# Patient Record
Sex: Male | Born: 1950
Health system: Southern US, Community
[De-identification: ages and names within clinical notes are randomized; demographics above are authoritative.]

## PROBLEM LIST (undated history)

## (undated) DIAGNOSIS — K219 Gastro-esophageal reflux disease without esophagitis: Secondary | ICD-10-CM

## (undated) DIAGNOSIS — R7301 Impaired fasting glucose: Secondary | ICD-10-CM

## (undated) DIAGNOSIS — C44300 Unspecified malignant neoplasm of skin of unspecified part of face: Secondary | ICD-10-CM

## (undated) DIAGNOSIS — G459 Transient cerebral ischemic attack, unspecified: Secondary | ICD-10-CM

## (undated) DIAGNOSIS — G4733 Obstructive sleep apnea (adult) (pediatric): Secondary | ICD-10-CM

## (undated) DIAGNOSIS — G4730 Sleep apnea, unspecified: Secondary | ICD-10-CM

## (undated) DIAGNOSIS — R2681 Unsteadiness on feet: Secondary | ICD-10-CM

## (undated) DIAGNOSIS — E7800 Pure hypercholesterolemia, unspecified: Secondary | ICD-10-CM

## (undated) DIAGNOSIS — I10 Essential (primary) hypertension: Secondary | ICD-10-CM

## (undated) HISTORY — PX: OTHER SURGICAL HISTORY: SHX169

## (undated) HISTORY — DX: Obstructive sleep apnea (adult) (pediatric): G47.33

## (undated) HISTORY — DX: Gastro-esophageal reflux disease without esophagitis: K21.9

## (undated) HISTORY — PX: COLONOSCOPY: SHX174

## (undated) HISTORY — DX: Unspecified malignant neoplasm of skin of unspecified part of face: C44.300

## (undated) HISTORY — DX: Pure hypercholesterolemia, unspecified: E78.00

## (undated) HISTORY — DX: Essential (primary) hypertension: I10

## (undated) HISTORY — DX: Impaired fasting glucose: R73.01

---

## 2000-12-03 ENCOUNTER — Encounter: Payer: Self-pay | Admitting: Family Medicine

## 2000-12-03 ENCOUNTER — Encounter: Admission: RE | Admit: 2000-12-03 | Discharge: 2000-12-03 | Payer: Self-pay | Admitting: Family Medicine

## 2013-06-22 ENCOUNTER — Institutional Professional Consult (permissible substitution): Payer: BC Managed Care – PPO | Admitting: Neurology

## 2013-06-29 ENCOUNTER — Encounter: Payer: Self-pay | Admitting: Neurology

## 2013-06-29 ENCOUNTER — Encounter (INDEPENDENT_AMBULATORY_CARE_PROVIDER_SITE_OTHER): Payer: Self-pay

## 2013-06-29 ENCOUNTER — Ambulatory Visit (INDEPENDENT_AMBULATORY_CARE_PROVIDER_SITE_OTHER): Payer: BC Managed Care – PPO | Admitting: Neurology

## 2013-06-29 VITALS — BP 156/82 | HR 49 | Resp 16 | Ht 70.0 in | Wt 192.0 lb

## 2013-06-29 DIAGNOSIS — R0683 Snoring: Secondary | ICD-10-CM

## 2013-06-29 DIAGNOSIS — R0609 Other forms of dyspnea: Secondary | ICD-10-CM

## 2013-06-29 NOTE — Progress Notes (Signed)
[facility]  Provider:  Melvyn Novas, M D  Referring Provider: Delorse Lek, MD Primary Care Physician:  Delorse Lek, MD  Chief Complaint  Patient presents with  . eval of OSA    HPI:  Caleb Rogers is a 62 y.o. male  Is seen here as a referral   from Dr. Doristine Counter for  Sleep evaluation.   Is, in married, Caucasian, right-handed gentleman presents today upon request of his spouse. She has noted that he is snoring [name] and all lower and has actually 8 or recorded as snoring sounds. From the variation in amplitude and sleep rhythm she concluded that the patient likely has apnea. The patient himself feels not impaired in any way, he is overall a very healthy individual whose past medical history is only positive for high cholesterol he has no surgical history. He has a family history on the paternal side and in his brother for coronary artery disease so.  The patient states that he falls asleep very quickly and promptly when going to bed, his regular bedtime as Pan, taken only a couple of minutes to fall asleep. Sleeps through the  night except for one bathroom break at about 4 AM. He noted nor difficulties to reinitiate sleep after this nocturia. He rises at 6.45 , spontaneously waking, no alarm needed.  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 He is exposed to daylight.  No further use of caffeine, takes power naps about 2-3 days a week. No shift work history. He shares his bedroom with his wife who is bothered by his snoring. He doesn't watch TV or read in bed.  No morning headaches, but  dry mouth , no rhinitis.   No family history of sleep disorders, father took naps ( he was a country GP ) died in his 79's.    He will occasionally take a nap in the afternoon, the patient is a nap will be 5 -20 minutes. Power naps - feels refreshed and recharged. He exercises daily , feels that helps him with  stress.   Review of Systems: Out of a complete 14 system review, the patient complains of only the following symptoms, and all other reviewed systems are negative. Snoring, daytime naps. EDS 1 , FSS zero.   History   Social History  . Marital Status: Single    Spouse Name: N/A    Number of Children: N/A  . Years of Education: N/A   Occupational History  . Not on file.   Social History Main Topics  . Smoking status: Former Games developer  . Smokeless tobacco: Never Used     Comment: pt quit July 1997  . Alcohol Use: 1.8 oz/week    3 Glasses of wine per week     Comment: SOCIAL  . Drug Use: No  . Sexual Activity: Yes    Partners: Female   Other Topics Concern  . Not on file   Social History Narrative  . No narrative on file    Family History  Problem Relation Age of Onset  . Cancer Mother   . Congestive Heart Failure Mother   . Coronary artery disease Father   . Coronary artery disease Brother 82    Past Medical History  Diagnosis Date  . GERD (gastroesophageal reflux disease)   . Impaired fasting glucose   . Pure hypercholesterolemia     Past Surgical History  Procedure Laterality Date  . None      Current Outpatient Prescriptions  Medication Sig Dispense Refill  . aspirin 81 MG tablet Take 81 mg by mouth daily.      Marland Kitchen atorvastatin (LIPITOR) 40 MG tablet Take 40 mg by mouth daily. 1/2 TAB QD      . omeprazole (PRILOSEC) 20 MG capsule Take 20 mg by mouth daily.       No current facility-administered medications for this visit.    Allergies as of 06/29/2013  . (No Known Allergies)    Vitals: BP 156/82  Pulse 49  Resp 16  Ht 5\' 10"  (1.778 m)  Wt 192 lb (87.091 kg)  BMI 27.55 kg/m2 Last Weight:  Wt Readings from Last 1 Encounters:  06/29/13 192 lb (87.091 kg)   Last Height:   Ht Readings from Last 1 Encounters:  06/29/13 5\' 10"  (1.778 m)    Physical exam:  General: The patient is awake, alert and appears not in acute distress. The patient is  well groomed. Head: Normocephalic, atraumatic. Neck is supple. Mallampati 2-3 elongated uvula, no TMJ and no lateral restriction. , neck circumference: 14.75   Cardiovascular:  Regular rate and rhythm, without  murmurs or carotid bruit, and without distended neck veins. Respiratory: Lungs are clear to auscultation. Skin:  Without evidence of edema, or rash Trunk: BMI is elevated , patient has normal posture.  Neurologic exam : The patient is awake and alert, oriented to place and time.  Memory subjective  described as intact. There is a normal attention span & concentration ability.  Speech is fluent without  dysarthria, dysphonia or aphasia. Mood and affect are appropriate.  Cranial nerves: Pupils are equal and briskly reactive to light. Funduscopic exam without  evidence of pallor or edema. Extraocular movements  in vertical and horizontal planes intact and without nystagmus. Visual fields by finger perimetry are intact. Hearing to finger rub intact.  Facial sensation intact to fine touch. Facial motor strength is symmetric and tongue and uvula move midline.  Motor exam:   Normal tone and normal muscle bulk and symmetric normal strength in all extremities.  Sensory:  Fine touch, pinprick and vibration were tested in all extremities.  Proprioception is tested in the upper extremities only. This was  normal.  Coordination: Rapid alternating movements in the fingers/hands is tested and normal.  Finger-to-nose maneuver tested and normal without evidence of ataxia, dysmetria or tremor.  Gait and station: Patient walks without assistive device . Strength within normal limits.  Stance is stable and normal. Tandem gait  unfragmented. Romberg testing is  normal.  Deep tendon reflexes: in the  upper and lower extremities are symmetric and intact.  Babinski maneuver response is downgoing.   Assessment:  After physical and neurologic examination, review of laboratory studies, imaging,  neurophysiology testing and pre-existing records, assessment is :  1) snoring , crescendo - with witnessed apnea. 2) no obvious physical risk factors.   Plan:  Treatment plan and additional workup :  sleep on your side,  Consider dental device for snoring.   1)  HST:  For this patient , unless not covered by insurance.

## 2013-06-29 NOTE — Patient Instructions (Signed)
Sleep Apnea  Sleep apnea is disorder that affects a person's sleep. A person with sleep apnea has abnormal pauses in their breathing when they sleep. It is hard for them to get a good sleep. This makes a person tired during the day. It also can lead to other physical problems. There are three types of sleep apnea. One type is when breathing stops for a short time because your airway is blocked (obstructive sleep apnea). Another type is when the brain sometimes fails to give the normal signal to breathe to the muscles that control your breathing (central sleep apnea). The third type is a combination of the other two types.  HOME CARE  · Do not sleep on your back. Try to sleep on your side.  · Take all medicine as told by your doctor.  · Avoid alcohol, calming medicines (sedatives), and depressant drugs.  · Try to lose weight if you are overweight. Talk to your doctor about a healthy weight goal.  Your doctor may have you use a device that helps to open your airway. It can help you get the air that you need. It is called a positive airway pressure (PAP) device. There are three types of PAP devices:  · Continuous positive airway pressure (CPAP) device.  · Nasal expiratory positive airway pressure (EPAP) device.  · Bilevel positive airway pressure (BPAP) device.  MAKE SURE YOU:  · Understand these instructions.  · Will watch your condition.  · Will get help right away if you are not doing well or get worse.  Document Released: 04/17/2008 Document Revised: 06/25/2012 Document Reviewed: 11/10/2011  Exit[employer]® Patient Information ©2014 [employer].

## 2013-07-02 ENCOUNTER — Other Ambulatory Visit: Payer: Self-pay | Admitting: *Deleted

## 2013-07-02 DIAGNOSIS — G4733 Obstructive sleep apnea (adult) (pediatric): Secondary | ICD-10-CM

## 2013-07-02 NOTE — Progress Notes (Signed)
[employer] will not approve home sleep test but patient does qualify for in lab study based upon his history of witnessed apneas.

## 2013-07-12 ENCOUNTER — Ambulatory Visit (INDEPENDENT_AMBULATORY_CARE_PROVIDER_SITE_OTHER): Payer: BC Managed Care – PPO

## 2013-07-12 VITALS — BP 124/69

## 2013-07-12 DIAGNOSIS — G4733 Obstructive sleep apnea (adult) (pediatric): Secondary | ICD-10-CM

## 2013-07-21 ENCOUNTER — Telehealth: Payer: Self-pay | Admitting: Neurology

## 2013-07-21 ENCOUNTER — Encounter: Payer: Self-pay | Admitting: *Deleted

## 2013-07-21 DIAGNOSIS — G4733 Obstructive sleep apnea (adult) (pediatric): Secondary | ICD-10-CM

## 2013-07-21 NOTE — Telephone Encounter (Signed)
I called and left a message for the patient about his recent sleep study results. I informed the patient that the study confirmed the diagnosis of severe obstructive sleep apnea and Dr. Vickey Huger recommends CPAP therapy at home. I will fax a copy of the report to Dr. Mellody Life office and mail a copy to his home along with a follow up letter.

## 2013-08-23 DIAGNOSIS — R2681 Unsteadiness on feet: Secondary | ICD-10-CM

## 2013-08-23 HISTORY — DX: Unsteadiness on feet: R26.81

## 2013-09-15 ENCOUNTER — Ambulatory Visit: Payer: Self-pay | Admitting: Family Medicine

## 2013-09-16 ENCOUNTER — Emergency Department (HOSPITAL_COMMUNITY): Payer: BC Managed Care – PPO

## 2013-09-16 ENCOUNTER — Ambulatory Visit: Payer: Self-pay | Admitting: Family Medicine

## 2013-09-16 ENCOUNTER — Observation Stay (HOSPITAL_COMMUNITY): Payer: BC Managed Care – PPO

## 2013-09-16 ENCOUNTER — Encounter (HOSPITAL_COMMUNITY): Payer: Self-pay | Admitting: Emergency Medicine

## 2013-09-16 ENCOUNTER — Observation Stay (HOSPITAL_COMMUNITY)
Admission: EM | Admit: 2013-09-16 | Discharge: 2013-09-17 | Disposition: A | Discharge status: Home / Self Care | Payer: BC Managed Care – PPO | Attending: Internal Medicine | Admitting: Internal Medicine

## 2013-09-16 DIAGNOSIS — R209 Unspecified disturbances of skin sensation: Secondary | ICD-10-CM | POA: Insufficient documentation

## 2013-09-16 DIAGNOSIS — R262 Difficulty in walking, not elsewhere classified: Secondary | ICD-10-CM | POA: Insufficient documentation

## 2013-09-16 DIAGNOSIS — I10 Essential (primary) hypertension: Secondary | ICD-10-CM | POA: Diagnosis present

## 2013-09-16 DIAGNOSIS — R5383 Other fatigue: Secondary | ICD-10-CM

## 2013-09-16 DIAGNOSIS — G459 Transient cerebral ischemic attack, unspecified: Secondary | ICD-10-CM | POA: Diagnosis present

## 2013-09-16 DIAGNOSIS — Z9181 History of falling: Secondary | ICD-10-CM | POA: Insufficient documentation

## 2013-09-16 DIAGNOSIS — K219 Gastro-esophageal reflux disease without esophagitis: Secondary | ICD-10-CM | POA: Insufficient documentation

## 2013-09-16 DIAGNOSIS — R5381 Other malaise: Secondary | ICD-10-CM | POA: Insufficient documentation

## 2013-09-16 DIAGNOSIS — E7800 Pure hypercholesterolemia, unspecified: Secondary | ICD-10-CM | POA: Insufficient documentation

## 2013-09-16 DIAGNOSIS — R51 Headache: Secondary | ICD-10-CM | POA: Insufficient documentation

## 2013-09-16 DIAGNOSIS — R7301 Impaired fasting glucose: Secondary | ICD-10-CM | POA: Insufficient documentation

## 2013-09-16 DIAGNOSIS — E785 Hyperlipidemia, unspecified: Secondary | ICD-10-CM | POA: Diagnosis present

## 2013-09-16 DIAGNOSIS — R2681 Unsteadiness on feet: Secondary | ICD-10-CM | POA: Diagnosis present

## 2013-09-16 DIAGNOSIS — Z7982 Long term (current) use of aspirin: Secondary | ICD-10-CM | POA: Insufficient documentation

## 2013-09-16 DIAGNOSIS — R269 Unspecified abnormalities of gait and mobility: Principal | ICD-10-CM | POA: Insufficient documentation

## 2013-09-16 DIAGNOSIS — F1010 Alcohol abuse, uncomplicated: Secondary | ICD-10-CM | POA: Diagnosis present

## 2013-09-16 DIAGNOSIS — Z87891 Personal history of nicotine dependence: Secondary | ICD-10-CM | POA: Insufficient documentation

## 2013-09-16 HISTORY — DX: Unsteadiness on feet: R26.81

## 2013-09-16 HISTORY — DX: Sleep apnea, unspecified: G47.30

## 2013-09-16 LAB — COMPREHENSIVE METABOLIC PANEL
ALBUMIN: 3.8 g/dL (ref 3.5–5.2)
ALT: 30 U/L (ref 0–53)
AST: 31 U/L (ref 0–37)
Alkaline Phosphatase: 84 U/L (ref 39–117)
BUN: 21 mg/dL (ref 6–23)
CO2: 23 mEq/L (ref 19–32)
Calcium: 9.4 mg/dL (ref 8.4–10.5)
Chloride: 104 mEq/L (ref 96–112)
Creatinine, Ser: 0.82 mg/dL (ref 0.50–1.35)
GFR calc Af Amer: >90 mL/min (ref >90)
GFR calc non Af Amer: >90 mL/min (ref >90)
Glucose, Bld: 111 mg/dL — ABNORMAL HIGH (ref 70–99)
Potassium: 4.1 mEq/L (ref 3.7–5.3)
SODIUM: 142 meq/L (ref 137–147)
TOTAL PROTEIN: 7.5 g/dL (ref 6.0–8.3)
Total Bilirubin: 0.3 mg/dL (ref 0.3–1.2)

## 2013-09-16 LAB — URINALYSIS, ROUTINE W REFLEX MICROSCOPIC
Bilirubin Urine: NEGATIVE
Glucose, UA: NEGATIVE mg/dL
HGB URINE DIPSTICK: NEGATIVE
Ketones, ur: NEGATIVE mg/dL
Leukocytes, UA: NEGATIVE
Nitrite: NEGATIVE
PROTEIN: NEGATIVE mg/dL
SPECIFIC GRAVITY, URINE: 1.023 (ref 1.005–1.030)
UROBILINOGEN UA: 0.2 mg/dL (ref 0.0–1.0)
pH: 7 (ref 5.0–8.0)

## 2013-09-16 LAB — CBC
HCT: 43.6 % (ref 39.0–52.0)
HEMOGLOBIN: 15.7 g/dL (ref 13.0–17.0)
MCH: 31.5 pg (ref 26.0–34.0)
MCHC: 36 g/dL (ref 30.0–36.0)
MCV: 87.6 fL (ref 78.0–100.0)
Platelets: 323 10*3/uL (ref 150–400)
RBC: 4.98 MIL/uL (ref 4.22–5.81)
RDW: 13.5 % (ref 11.5–15.5)
WBC: 7.3 10*3/uL (ref 4.0–10.5)

## 2013-09-16 LAB — CBC WITH DIFFERENTIAL/PLATELET
BASOS ABS: 0 10*3/uL (ref 0.0–0.1)
Basophils Relative: 1 % (ref 0–1)
Eosinophils Absolute: 0.3 10*3/uL (ref 0.0–0.7)
Eosinophils Relative: 4 % (ref 0–5)
HCT: 41.7 % (ref 39.0–52.0)
Hemoglobin: 14.9 g/dL (ref 13.0–17.0)
Lymphocytes Relative: 46 % (ref 12–46)
Lymphs Abs: 3.1 10*3/uL (ref 0.7–4.0)
MCH: 31.4 pg (ref 26.0–34.0)
MCHC: 35.7 g/dL (ref 30.0–36.0)
MCV: 87.8 fL (ref 78.0–100.0)
Monocytes Absolute: 0.7 10*3/uL (ref 0.1–1.0)
Monocytes Relative: 11 % (ref 3–12)
NEUTROS PCT: 39 % — AB (ref 43–77)
Neutro Abs: 2.7 10*3/uL (ref 1.7–7.7)
PLATELETS: 293 10*3/uL (ref 150–400)
RBC: 4.75 MIL/uL (ref 4.22–5.81)
RDW: 13.4 % (ref 11.5–15.5)
WBC: 6.8 10*3/uL (ref 4.0–10.5)

## 2013-09-16 LAB — RAPID URINE DRUG SCREEN, HOSP PERFORMED
AMPHETAMINES: NOT DETECTED
BARBITURATES: NOT DETECTED
Benzodiazepines: NOT DETECTED
Cocaine: NOT DETECTED
Opiates: NOT DETECTED
Tetrahydrocannabinol: NOT DETECTED

## 2013-09-16 LAB — GLUCOSE, CAPILLARY: Glucose-Capillary: 124 mg/dL — ABNORMAL HIGH (ref 70–99)

## 2013-09-16 LAB — CREATININE, SERUM
CREATININE: 0.78 mg/dL (ref 0.50–1.35)
GFR calc Af Amer: >90 mL/min (ref >90)

## 2013-09-16 LAB — VITAMIN B12: Vitamin B-12: 461 pg/mL (ref 211–911)

## 2013-09-16 LAB — HEMOGLOBIN A1C
HEMOGLOBIN A1C: 5.8 % — AB (ref <5.7)
Hgb A1c MFr Bld: 5.8 % — ABNORMAL HIGH (ref <5.7)
Mean Plasma Glucose: 120 mg/dL — ABNORMAL HIGH (ref <117)
Mean Plasma Glucose: 120 mg/dL — ABNORMAL HIGH (ref <117)

## 2013-09-16 LAB — FOLATE

## 2013-09-16 MED ORDER — SODIUM CHLORIDE 0.9 % IV SOLN
INTRAVENOUS | Status: DC
  Administered 2013-09-16: 08:00:00 via INTRAVENOUS

## 2013-09-16 MED ORDER — LORAZEPAM 2 MG/ML IJ SOLN: 1.0000 mg | Freq: Four times a day (QID) | INTRAMUSCULAR | Status: DC | PRN

## 2013-09-16 MED ORDER — ENOXAPARIN SODIUM 40 MG/0.4ML ~~LOC~~ SOLN
40.0000 mg | SUBCUTANEOUS | Status: DC
  Administered 2013-09-16: 40 mg via SUBCUTANEOUS
  Filled 2013-09-16 (×3): qty 0.4

## 2013-09-16 MED ORDER — MAGNESIUM 500 MG PO CAPS: 500.0000 mg | ORAL_CAPSULE | ORAL | Status: DC

## 2013-09-16 MED ORDER — LORAZEPAM 1 MG PO TABS: 1.0000 mg | ORAL_TABLET | Freq: Four times a day (QID) | ORAL | Status: DC | PRN

## 2013-09-16 MED ORDER — FOLIC ACID 1 MG PO TABS
1.0000 mg | ORAL_TABLET | Freq: Every day | ORAL | Status: DC
  Administered 2013-09-16 – 2013-09-17 (×2): 1 mg via ORAL
  Filled 2013-09-16 (×2): qty 1

## 2013-09-16 MED ORDER — ATORVASTATIN CALCIUM 20 MG PO TABS
20.0000 mg | ORAL_TABLET | Freq: Every day | ORAL | Status: DC
  Administered 2013-09-16: 20 mg via ORAL
  Filled 2013-09-16 (×3): qty 1

## 2013-09-16 MED ORDER — ASPIRIN 325 MG PO TABS
325.0000 mg | ORAL_TABLET | Freq: Every day | ORAL | Status: DC
  Administered 2013-09-16 – 2013-09-17 (×2): 325 mg via ORAL
  Filled 2013-09-16 (×3): qty 1

## 2013-09-16 MED ORDER — PANTOPRAZOLE SODIUM 40 MG PO TBEC
40.0000 mg | DELAYED_RELEASE_TABLET | Freq: Every day | ORAL | Status: DC
  Administered 2013-09-16 – 2013-09-17 (×2): 40 mg via ORAL
  Filled 2013-09-16 (×3): qty 1

## 2013-09-16 MED ORDER — VITAMIN D3 50 MCG (2000 UT) PO TABS: 2000.0000 [IU] | ORAL_TABLET | ORAL | Status: DC

## 2013-09-16 MED ORDER — ASPIRIN EC 81 MG PO TBEC
81.0000 mg | DELAYED_RELEASE_TABLET | Freq: Every day | ORAL | Status: DC
  Administered 2013-09-16: 81 mg via ORAL
  Filled 2013-09-16 (×2): qty 1

## 2013-09-16 MED ORDER — ADULT MULTIVITAMIN W/MINERALS CH
1.0000 | ORAL_TABLET | Freq: Every day | ORAL | Status: DC
  Administered 2013-09-16 – 2013-09-17 (×2): 1 via ORAL
  Filled 2013-09-16 (×3): qty 1

## 2013-09-16 MED ORDER — MAGNESIUM OXIDE 400 (241.3 MG) MG PO TABS
400.0000 mg | ORAL_TABLET | Freq: Every day | ORAL | Status: DC
  Administered 2013-09-17: 400 mg via ORAL
  Filled 2013-09-16: qty 1

## 2013-09-16 MED ORDER — VITAMIN B-1 100 MG PO TABS
100.0000 mg | ORAL_TABLET | Freq: Every day | ORAL | Status: DC
  Administered 2013-09-16 – 2013-09-17 (×2): 100 mg via ORAL
  Filled 2013-09-16 (×3): qty 1

## 2013-09-16 MED ORDER — VITAMIN E 180 MG (400 UNIT) PO CAPS
400.0000 [IU] | ORAL_CAPSULE | ORAL | Status: DC
  Administered 2013-09-17: 400 [IU] via ORAL
  Filled 2013-09-16: qty 1

## 2013-09-16 MED ORDER — ACETAMINOPHEN 325 MG PO TABS: 650.0000 mg | ORAL_TABLET | ORAL | Status: DC | PRN

## 2013-09-16 MED ORDER — SODIUM CHLORIDE 0.9 % IV SOLN
INTRAVENOUS | Status: DC
  Administered 2013-09-16: 17:00:00 via INTRAVENOUS

## 2013-09-16 MED ORDER — VITAMIN D3 25 MCG (1000 UNIT) PO TABS
2000.0000 [IU] | ORAL_TABLET | Freq: Every day | ORAL | Status: DC
  Administered 2013-09-17: 2000 [IU] via ORAL
  Filled 2013-09-16: qty 2

## 2013-09-16 MED ORDER — THIAMINE HCL 100 MG/ML IJ SOLN: 100.0000 mg | Freq: Every day | INTRAMUSCULAR | Status: DC

## 2013-09-16 NOTE — ED Notes (Signed)
Neuro MD at bedside

## 2013-09-16 NOTE — ED Notes (Signed)
Pt states that he woke this morning and is leaning to the rt side. Pt reports walking normal last night. Pt went to bed at 1030pm and did not wake until 620am today. Pt denies any pain no weakness noted. Pt states that rt side feel numb. Pt was noted to lean to the right when he walked.

## 2013-09-16 NOTE — ED Notes (Signed)
MD at bedside. 

## 2013-09-16 NOTE — ED Notes (Signed)
Pt in MRI.

## 2013-09-16 NOTE — ED Provider Notes (Signed)
CSN: [id number]     Arrival date & time 09/16/13  0712 History   First MD Initiated Contact with Patient 09/16/13 0719     Chief Complaint  Patient presents with  . Weakness     (Consider location/radiation/quality/duration/timing/severity/associated sxs/prior Treatment) Patient is a 63 y.o. male presenting with weakness. The history is provided by the patient.  Weakness  pt awoke with trouble walking described as leaning to the right Denies headache, blurred vision/diplopia, or unilaterall wekaness, went to bed last pm and was nl--denies confusion, trouble speaking,--no prior h/o same, sx worse with walking--denies hip/back pain, leg discomfort, no prior h/o cva  Past Medical History  Diagnosis Date  . GERD (gastroesophageal reflux disease)   . Impaired fasting glucose   . Pure hypercholesterolemia    Past Surgical History  Procedure Laterality Date  . None     Family History  Problem Relation Age of Onset  . Cancer Mother   . Congestive Heart Failure Mother   . Coronary artery disease Father   . Coronary artery disease Brother 68   History  Substance Use Topics  . Smoking status: Former Research scientist (life sciences)  . Smokeless tobacco: Never Used     Comment: pt quit July 1997  . Alcohol Use: 1.8 oz/week    3 Glasses of wine per week     Comment: SOCIAL    Review of Systems  Neurological: Positive for weakness.  All other systems reviewed and are negative.      Allergies  Review of patient's allergies indicates no known allergies.  Home Medications   Current Outpatient Rx  Name  Route  Sig  Dispense  Refill  . aspirin 81 MG tablet   Oral   Take 81 mg by mouth daily.         Marland Kitchen atorvastatin (LIPITOR) 40 MG tablet   Oral   Take 40 mg by mouth daily. 1/2 TAB QD         . omeprazole (PRILOSEC) 20 MG capsule   Oral   Take 20 mg by mouth daily.          There were no vitals taken for this visit. Physical Exam  Nursing note and vitals reviewed. Constitutional: He  is oriented to person, place, and time. He appears well-developed and well-nourished.  Non-toxic appearance. No distress.  HENT:  Head: Normocephalic and atraumatic.  Eyes: Conjunctivae, EOM and lids are normal. Pupils are equal, round, and reactive to light.  Neck: Normal range of motion. Neck supple. No tracheal deviation present. No mass present.  Cardiovascular: Normal rate, regular rhythm and normal heart sounds.  Exam reveals no gallop.   No murmur heard. Pulmonary/Chest: Effort normal and breath sounds normal. No stridor. No respiratory distress. He has no decreased breath sounds. He has no wheezes. He has no rhonchi. He has no rales.  Abdominal: Soft. Normal appearance and bowel sounds are normal. He exhibits no distension. There is no tenderness. There is no rebound and no CVA tenderness.  Musculoskeletal: Normal range of motion. He exhibits no edema and no tenderness.  Neurological: He is alert and oriented to person, place, and time. He has normal strength. No cranial nerve deficit or sensory deficit. Gait abnormal. Coordination normal. GCS eye subscore is 4. GCS verbal subscore is 5. GCS motor subscore is 6.  Skin: Skin is warm and dry. No abrasion and no rash noted.  Psychiatric: He has a normal mood and affect. His speech is normal and behavior is normal.  ED Course  Procedures (including critical care time) Labs Review Labs Reviewed  CBC WITH DIFFERENTIAL - Abnormal; Notable for the following:    Neutrophils Relative % 39 (*)    All other components within normal limits  COMPREHENSIVE METABOLIC PANEL - Abnormal; Notable for the following:    Glucose, Bld 111 (*)    All other components within normal limits   Imaging Review Ct Head Wo Contrast  09/16/2013   CLINICAL DATA:  Right-sided numbness and weakness with leaning towards the right  EXAM: CT HEAD WITHOUT CONTRAST  TECHNIQUE: Contiguous axial images were obtained from the base of the skull through the vertex without  intravenous contrast.  COMPARISON:  None.  FINDINGS: The ventricles are normal in size and position. There is no intracranial hemorrhage nor intracranial mass effect. There is no evidence of an evolving ischemic infarction. There is decreased density in the deep white matter of both cerebral hemispheres consistent with changes of mild chronic small vessel ischemia. Slightly asymmetric hypodensity in the anterior aspect of the basal ganglia on the left as compared to the right is demonstrated on images 18 and 19. The cerebellum and brainstem exhibit no acute abnormality.  At bone window settings the observed portions of the paranasal sinuses and mastoid air cells are clear. There is no evidence of an acute skull fracture.  IMPRESSION: 1. There is no definite evidence of an acute ischemic or hemorrhagic event. There is subtle decreased density in the anterior aspect of the basal ganglia on the left as compared to the right which could reflect very early ischemic change but it may reflect the sequelae of chronic small vessel ischemia. MRI would be useful to exclude early ischemic change especially if the patient's symptoms persist. 2. There is no evidence of an acute intracranial hemorrhage. 3. There is no intracranial mass effect or hydrocephalus.   Electronically Signed   By: David  Martinique   On: 09/16/2013 08:08   Mr Brain Wo Contrast  09/16/2013   CLINICAL DATA:  63 year old male with loss of balance, falling to the right. Abnormal sensation with right side numbness and weakness. Initial encounter.  EXAM: MRI HEAD WITHOUT CONTRAST  TECHNIQUE: Multiplanar, multiecho pulse sequences of the brain and surrounding structures were obtained without intravenous contrast.  COMPARISON:  Head CT without contrast 09/16/2013.  FINDINGS: No restricted diffusion to suggest acute infarction. No midline shift, mass effect, evidence of mass lesion, ventriculomegaly, extra-axial collection or acute intracranial hemorrhage.  Cervicomedullary junction and pituitary are within normal limits. Negative visualized cervical spine. Major intracranial vascular flow voids are preserved; dominant distal left vertebral artery.  Patchy bilateral cerebral white matter T2 and FLAIR hyperintensity. T2 heterogeneity in the deep gray matter nuclei, in part due tib perivascular spaces but also in part similar to the white matter signal abnormality. Questionable small area of chronic cortical encephalomalacia along the right frontal lobe on series 7, image 18. Elsewhere the cerebral cortex appears preserved. Brainstem within normal limits. There is a small chronic lacunar infarct in the inferior right cerebellar hemisphere (series 6, image 4). Visible internal auditory structures appear normal.  Visualized orbit soft tissues are within normal limits. Mastoids are clear. Mild ethmoid sinus mucosal thickening. Visualized bone marrow signal is within normal limits. Visualized scalp soft tissues are within normal limits.  IMPRESSION: 1.  No acute intracranial abnormality. 2. Nonspecific signal changes, but favor due to chronic small vessel ischemia, including evidence of a small chronic right cerebellar lacunar infarct.   Electronically Signed  By: Lars Pinks M.D.   On: 09/16/2013 10:42    EKG Interpretation    Date/Time:  Wednesday September 16 2013 07:26:15 EST Ventricular Rate:  57 PR Interval:  164 QRS Duration: 112 QT Interval:  424 QTC Calculation: 413 R Axis:   -15 Text Interpretation:  Age not entered, assumed to be  63 years old for purpose of ECG interpretation Sinus rhythm Ventricular premature complex Incomplete RBBB and LAFB Confirmed by Mayerli Kirst  MD, Jla Reynolds ([id number]) on 09/16/2013 7:37:49 AM            MDM   Final diagnoses:  None    Patient with head CT and MRI results noted and indicated to him. We'll have neurology see patient. Repeat neurological exam remains unchanged  12:40 PM Patient seen by neurology who  recommends the patient admitted for observation as well as a more detailed brain MRI    Leota Jacobsen, MD 09/16/13 1240

## 2013-09-16 NOTE — Consult Note (Signed)
Referring Physician: Zenia Resides    Chief Complaint:  Gait imbalance  HPI:                                                                                                                                         Caleb Rogers is an 63 y.o. male who admits to drinking 4 glasses of wine daily but does not smoke.  Patient went to sleep last night feeling well.  He awoke this am and noted he was listing to the right when he was walking.  He denies any inner ear infection, fullness, ringing, vertigo, diplopia, dysphagia.  He also denies any decreased sensation or weakness. He states he has never had symptoms like this prior.   Date last known well: Date: 09/15/2013 Time last known well: Time: 22:30 tPA Given: No: out of the window  Past Medical History  Diagnosis Date  . GERD (gastroesophageal reflux disease)   . Impaired fasting glucose   . Pure hypercholesterolemia     Past Surgical History  Procedure Laterality Date  . None      Family History  Problem Relation Age of Onset  . Cancer Mother   . Congestive Heart Failure Mother   . Coronary artery disease Father   . Coronary artery disease Brother 44   Social History:  reports that he has quit smoking. He has never used smokeless tobacco. He reports that he drinks about 1.8 ounces of alcohol per week. He reports that he does not use illicit drugs.  Allergies: No Known Allergies  Medications:                                                                                                                           Current Facility-Administered Medications  Medication Dose Route Frequency Provider Last Rate Last Dose  . 0.9 %  sodium chloride infusion   Intravenous Continuous Leota Jacobsen, MD 20 mL/hr at 09/16/13 [id number]     Current Outpatient Prescriptions  Medication Sig Dispense Refill  . Acetylcarnitine HCl (ACETYL L-CARNITINE) 500 MG CAPS Take 500 mg by mouth every other day.      Ilean Skill Lipoic Acid 200 MG CAPS Take 200 mg by mouth  every other day.      Marland Kitchen atorvastatin (LIPITOR) 40 MG tablet Take 20 mg by mouth daily.       Marland Kitchen B  Complex-C (SUPER B COMPLEX PO) Take 0.5 tablets by mouth every other day.      . Cholecalciferol (VITAMIN D3) 2000 UNITS TABS Take 2,000 Units by mouth every other day.      . Magnesium 500 MG CAPS Take 500 mg by mouth every other day.      . Multiple Vitamin (MULTIVITAMIN WITH MINERALS) TABS tablet Take 0.5 tablets by mouth every other day.      . Omega-3 Fatty Acids (FISH OIL) 1200 MG CAPS Take 1,200 mg by mouth every other day.      Marland Kitchen omeprazole (PRILOSEC) 20 MG capsule Take 20 mg by mouth daily.      . Potassium 99 MG TABS Take 99 mg by mouth every other day.      . vitamin E 400 UNIT capsule Take 400 Units by mouth every other day.         ROS:                                                                                                                                       History obtained from the patient  General ROS: negative for - chills, fatigue, fever, night sweats, weight gain or weight loss Psychological ROS: negative for - behavioral disorder, hallucinations, memory difficulties, mood swings or suicidal ideation Ophthalmic ROS: negative for - blurry vision, double vision, eye pain or loss of vision ENT ROS: negative for - epistaxis, nasal discharge, oral lesions, sore throat, tinnitus or vertigo Allergy and Immunology ROS: negative for - hives or itchy/watery eyes Hematological and Lymphatic ROS: negative for - bleeding problems, bruising or swollen lymph nodes Endocrine ROS: negative for - galactorrhea, hair pattern changes, polydipsia/polyuria or temperature intolerance Respiratory ROS: negative for - cough, hemoptysis, shortness of breath or wheezing Cardiovascular ROS: negative for - chest pain, dyspnea on exertion, edema or irregular heartbeat Gastrointestinal ROS: negative for - abdominal pain, diarrhea, hematemesis, nausea/vomiting or stool incontinence Genito-Urinary  ROS: negative for - dysuria, hematuria, incontinence or urinary frequency/urgency Musculoskeletal ROS: negative for - joint swelling or muscular weakness Neurological ROS: as noted in HPI Dermatological ROS: negative for rash and skin lesion changes  Neurologic Examination:                                                                                                      Blood pressure 165/87, pulse 58, temperature 97.5 F (36.4 C), temperature source Oral, resp. rate 15, SpO2 98.00%.   Mental Status: Alert, oriented, thought  content appropriate.  Speech fluent without evidence of aphasia.  Able to follow 3 step commands without difficulty. Cranial Nerves: II: Discs flat bilaterally; Visual fields grossly normal, pupils equal, round, reactive to light and accommodation III,IV, VI: ptosis not present, extra-ocular motions intact bilaterally V,VII: smile symmetric, facial light touch sensation normal bilaterally VIII: hearing normal bilaterally IX,X: gag reflex present XI: bilateral shoulder shrug XII: midline tongue extension without atrophy or fasciculations  Motor: Right : Upper extremity   5/5    Left:     Upper extremity   5/5  Lower extremity   5/5     Lower extremity   5/5 Tone and bulk:normal tone throughout; no atrophy noted Sensory: Pinprick and light touch intact throughout, bilaterally Deep Tendon Reflexes:  Right: Upper Extremity   Left: Upper extremity   biceps (C-5 to C-6) 2/4   biceps (C-5 to C-6) 2/4 tricep (C7) 2/4    triceps (C7) 2/4 Brachioradialis (C6) 2/4  Brachioradialis (C6) 2/4  Lower Extremity Lower Extremity  quadriceps (L-2 to L-4) 2/4   quadriceps (L-2 to L-4) 2/4 Achilles (S1) 2/4   Achilles (S1) 2/4  Plantars: Right: downgoing   Left: downgoing Cerebellar: normal finger-to-nose,  normal heel-to-shin test Gait: lists to the right CV: pulses palpable throughout    Lab Results: Basic Metabolic Panel:  Recent Labs Lab 09/16/13 0730  NA 142   K 4.1  CL 104  CO2 23  GLUCOSE 111*  BUN 21  CREATININE 0.82  CALCIUM 9.4    Liver Function Tests:  Recent Labs Lab 09/16/13 0730  AST 31  ALT 30  ALKPHOS 84  BILITOT 0.3  PROT 7.5  ALBUMIN 3.8   No results found for this basename: LIPASE, AMYLASE,  in the last 168 hours No results found for this basename: AMMONIA,  in the last 168 hours  CBC:  Recent Labs Lab 09/16/13 0730  WBC 6.8  NEUTROABS 2.7  HGB 14.9  HCT 41.7  MCV 87.8  PLT 293    Cardiac Enzymes: No results found for this basename: CKTOTAL, CKMB, CKMBINDEX, TROPONINI,  in the last 168 hours  Lipid Panel: No results found for this basename: CHOL, TRIG, HDL, CHOLHDL, VLDL, LDLCALC,  in the last 168 hours  CBG: No results found for this basename: GLUCAP,  in the last 168 hours  Microbiology: No results found for this or any previous visit.  Coagulation Studies: No results found for this basename: LABPROT, INR,  in the last 72 hours  Imaging: Ct Head Wo Contrast  09/16/2013   CLINICAL DATA:  Right-sided numbness and weakness with leaning towards the right  EXAM: CT HEAD WITHOUT CONTRAST  TECHNIQUE: Contiguous axial images were obtained from the base of the skull through the vertex without intravenous contrast.  COMPARISON:  None.  FINDINGS: The ventricles are normal in size and position. There is no intracranial hemorrhage nor intracranial mass effect. There is no evidence of an evolving ischemic infarction. There is decreased density in the deep white matter of both cerebral hemispheres consistent with changes of mild chronic small vessel ischemia. Slightly asymmetric hypodensity in the anterior aspect of the basal ganglia on the left as compared to the right is demonstrated on images 18 and 19. The cerebellum and brainstem exhibit no acute abnormality.  At bone window settings the observed portions of the paranasal sinuses and mastoid air cells are clear. There is no evidence of an acute skull fracture.   IMPRESSION: 1. There is no definite evidence of  an acute ischemic or hemorrhagic event. There is subtle decreased density in the anterior aspect of the basal ganglia on the left as compared to the right which could reflect very early ischemic change but it may reflect the sequelae of chronic small vessel ischemia. MRI would be useful to exclude early ischemic change especially if the patient's symptoms persist. 2. There is no evidence of an acute intracranial hemorrhage. 3. There is no intracranial mass effect or hydrocephalus.   Electronically Signed   By: David  Martinique   On: 09/16/2013 08:08   Mr Brain Wo Contrast  09/16/2013   CLINICAL DATA:  63 year old male with loss of balance, falling to the right. Abnormal sensation with right side numbness and weakness. Initial encounter.  EXAM: MRI HEAD WITHOUT CONTRAST  TECHNIQUE: Multiplanar, multiecho pulse sequences of the brain and surrounding structures were obtained without intravenous contrast.  COMPARISON:  Head CT without contrast 09/16/2013.  FINDINGS: No restricted diffusion to suggest acute infarction. No midline shift, mass effect, evidence of mass lesion, ventriculomegaly, extra-axial collection or acute intracranial hemorrhage. Cervicomedullary junction and pituitary are within normal limits. Negative visualized cervical spine. Major intracranial vascular flow voids are preserved; dominant distal left vertebral artery.  Patchy bilateral cerebral white matter T2 and FLAIR hyperintensity. T2 heterogeneity in the deep gray matter nuclei, in part due tib perivascular spaces but also in part similar to the white matter signal abnormality. Questionable small area of chronic cortical encephalomalacia along the right frontal lobe on series 7, image 18. Elsewhere the cerebral cortex appears preserved. Brainstem within normal limits. There is a small chronic lacunar infarct in the inferior right cerebellar hemisphere (series 6, image 4). Visible internal auditory  structures appear normal.  Visualized orbit soft tissues are within normal limits. Mastoids are clear. Mild ethmoid sinus mucosal thickening. Visualized bone marrow signal is within normal limits. Visualized scalp soft tissues are within normal limits.  IMPRESSION: 1.  No acute intracranial abnormality. 2. Nonspecific signal changes, but favor due to chronic small vessel ischemia, including evidence of a small chronic right cerebellar lacunar infarct.   Electronically Signed   By: Lars Pinks M.D.   On: 09/16/2013 10:42     Assessment and plan discussed with with attending physician and they are in agreement.    Etta Quill PA-C [facility] [phone]  09/16/2013, 11:44 AM   Assessment: 63 y.o. male with new onset gait instability (listing to the right). Neuro-exam is not particulrly impressive except for evidence of leaning to the right when walking. MRI obtained was within normal limits showing no abnormalities in the cerebellum or posterior fossa.  Cannot exclude a small stroke not visualized by MRI.   Will obtain a thin cut MRI of brain stem and cerebellum tomorrow to further evaluate possibility of small CVA.  Patient seen and examined together with physician assistant and I concur with the assessment and plan.  Dorian Pod, MD [facility] [phone]

## 2013-09-16 NOTE — H&P (Signed)
History and Physical       Hospital Admission Note Date: 09/16/2013  Patient name: Caleb Rogers Medical record number: [id number] Date of birth: 10-12-1950 Age: 63 y.o. Gender: male  PCP: Stephens Shire, MD    Chief Complaint:  Difficulty walking and leaning towards the right since morning  HPI: Patient is a 63 year old male with history of hyperlipidemia, GERD with no prior history of stroke presented to the ER with difficulty walking and leaning towards the right since morning. History was obtained from the patient who stated that he was in his baseline state of health until last night, he woke up this morning around 6:20 AM. Once he got up from the bed and walk, he noticed difficulty walking and leaning towards the right side. He denied any headache, confusion, trouble speaking or difficulty eating. He denied any vertigo or any trouble with vision.  Review of Systems:  Constitutional: Denies fever, chills, diaphoresis, poor appetite and fatigue.  HEENT: Denies photophobia, eye pain, redness, hearing loss, ear pain, congestion, sore throat, rhinorrhea, sneezing, mouth sores, trouble swallowing, neck pain, neck stiffness and tinnitus.   Respiratory: Denies SOB, DOE, cough, chest tightness,  and wheezing.   Cardiovascular: Denies chest pain, palpitations and leg swelling.  Gastrointestinal: Denies nausea, vomiting, abdominal pain, diarrhea, constipation, blood in stool and abdominal distention.  Genitourinary: Denies dysuria, urgency, frequency, hematuria, flank pain and difficulty urinating.  Musculoskeletal: Denies myalgias, back pain, joint swelling, arthralgias and gait problem.  Skin: Denies pallor, rash and wound.  Neurological: Please see history of present illness  Hematological: Denies adenopathy. Easy bruising, personal or family bleeding history  Psychiatric/Behavioral: Denies suicidal ideation, mood changes, confusion,  nervousness, sleep disturbance and agitation  Past Medical History: Past Medical History  Diagnosis Date  . GERD (gastroesophageal reflux disease)   . Impaired fasting glucose   . Pure hypercholesterolemia    Past Surgical History  Procedure Laterality Date  . None      Medications: Prior to Admission medications   Medication Sig Start Date End Date Taking? Authorizing Provider  Acetylcarnitine HCl (ACETYL L-CARNITINE) 500 MG CAPS Take 500 mg by mouth every other day.   Yes Historical Provider, MD  Alpha Lipoic Acid 200 MG CAPS Take 200 mg by mouth every other day.   Yes Historical Provider, MD  atorvastatin (LIPITOR) 40 MG tablet Take 20 mg by mouth daily.    Yes Historical Provider, MD  B Complex-C (SUPER B COMPLEX PO) Take 0.5 tablets by mouth every other day.   Yes Historical Provider, MD  Cholecalciferol (VITAMIN D3) 2000 UNITS TABS Take 2,000 Units by mouth every other day.   Yes Historical Provider, MD  Magnesium 500 MG CAPS Take 500 mg by mouth every other day.   Yes Historical Provider, MD  Multiple Vitamin (MULTIVITAMIN WITH MINERALS) TABS tablet Take 0.5 tablets by mouth every other day.   Yes Historical Provider, MD  Omega-3 Fatty Acids (FISH OIL) 1200 MG CAPS Take 1,200 mg by mouth every other day.   Yes Historical Provider, MD  omeprazole (PRILOSEC) 20 MG capsule Take 20 mg by mouth daily.   Yes Historical Provider, MD  Potassium 99 MG TABS Take 99 mg by mouth every other day.   Yes Historical Provider, MD  vitamin E 400 UNIT capsule Take 400 Units by mouth every other day.   Yes Historical Provider, MD    Allergies:  No Known Allergies  Social History:  reports that he has quit smoking. He has never used smokeless  tobacco. Per patient he drinks about 4 glasses of wine every night. He reports that he does not use illicit drugs.  Family History: Family History  Problem Relation Age of Onset  . Cancer Mother   . Congestive Heart Failure Mother   . Coronary artery  disease Father   . Coronary artery disease Brother 44    Physical Exam: Blood pressure 169/81, pulse 53, temperature 97.5 F (36.4 C), temperature source Oral, resp. rate 14, SpO2 96.00%. General: Alert, awake, oriented x3, in no acute distress. HEENT: normocephalic, atraumatic, anicteric sclera, pink conjunctiva, pupils equal and reactive to light and accomodation, oropharynx clear Neck: supple, no masses or lymphadenopathy, no goiter, no bruits  Heart: Regular rate and rhythm, without murmurs, rubs or gallops. Lungs: Clear to auscultation bilaterally, no wheezing, rales or rhonchi. Abdomen: Soft, nontender, nondistended, positive bowel sounds, no masses. Extremities: No clubbing, cyanosis or edema with positive pedal pulses. Neuro: Grossly intact, no focal neurological deficits, strength 5/5 upper and lower extremities bilaterally, normal finger to nose test, heel to shin test normal, gait not tested Psych: alert and oriented x 3, normal mood and affect Skin: no rashes or lesions, warm and dry   LABS on Admission:  Basic Metabolic Panel:  Recent Labs Lab 09/16/13 0730  NA 142  K 4.1  CL 104  CO2 23  GLUCOSE 111*  BUN 21  CREATININE 0.82  CALCIUM 9.4   Liver Function Tests:  Recent Labs Lab 09/16/13 0730  AST 31  ALT 30  ALKPHOS 84  BILITOT 0.3  PROT 7.5  ALBUMIN 3.8   No results found for this basename: LIPASE, AMYLASE,  in the last 168 hours No results found for this basename: AMMONIA,  in the last 168 hours CBC:  Recent Labs Lab 09/16/13 0730  WBC 6.8  NEUTROABS 2.7  HGB 14.9  HCT 41.7  MCV 87.8  PLT 293   Cardiac Enzymes: No results found for this basename: CKTOTAL, CKMB, CKMBINDEX, TROPONINI,  in the last 168 hours BNP: No components found with this basename: POCBNP,  CBG: No results found for this basename: GLUCAP,  in the last 168 hours   Radiological Exams on Admission: Ct Head Wo Contrast  09/16/2013   CLINICAL DATA:  Right-sided  numbness and weakness with leaning towards the right  EXAM: CT HEAD WITHOUT CONTRAST  TECHNIQUE: Contiguous axial images were obtained from the base of the skull through the vertex without intravenous contrast.  COMPARISON:  None.  FINDINGS: The ventricles are normal in size and position. There is no intracranial hemorrhage nor intracranial mass effect. There is no evidence of an evolving ischemic infarction. There is decreased density in the deep white matter of both cerebral hemispheres consistent with changes of mild chronic small vessel ischemia. Slightly asymmetric hypodensity in the anterior aspect of the basal ganglia on the left as compared to the right is demonstrated on images 18 and 19. The cerebellum and brainstem exhibit no acute abnormality.  At bone window settings the observed portions of the paranasal sinuses and mastoid air cells are clear. There is no evidence of an acute skull fracture.  IMPRESSION: 1. There is no definite evidence of an acute ischemic or hemorrhagic event. There is subtle decreased density in the anterior aspect of the basal ganglia on the left as compared to the right which could reflect very early ischemic change but it may reflect the sequelae of chronic small vessel ischemia. MRI would be useful to exclude early ischemic change especially if  the patient's symptoms persist. 2. There is no evidence of an acute intracranial hemorrhage. 3. There is no intracranial mass effect or hydrocephalus.   Electronically Signed   By: David  Martinique   On: 09/16/2013 08:08   Mr Brain Wo Contrast  09/16/2013   CLINICAL DATA:  63 year old male with loss of balance, falling to the right. Abnormal sensation with right side numbness and weakness. Initial encounter.  EXAM: MRI HEAD WITHOUT CONTRAST  TECHNIQUE: Multiplanar, multiecho pulse sequences of the brain and surrounding structures were obtained without intravenous contrast.  COMPARISON:  Head CT without contrast 09/16/2013.  FINDINGS: No  restricted diffusion to suggest acute infarction. No midline shift, mass effect, evidence of mass lesion, ventriculomegaly, extra-axial collection or acute intracranial hemorrhage. Cervicomedullary junction and pituitary are within normal limits. Negative visualized cervical spine. Major intracranial vascular flow voids are preserved; dominant distal left vertebral artery.  Patchy bilateral cerebral white matter T2 and FLAIR hyperintensity. T2 heterogeneity in the deep gray matter nuclei, in part due tib perivascular spaces but also in part similar to the white matter signal abnormality. Questionable small area of chronic cortical encephalomalacia along the right frontal lobe on series 7, image 18. Elsewhere the cerebral cortex appears preserved. Brainstem within normal limits. There is a small chronic lacunar infarct in the inferior right cerebellar hemisphere (series 6, image 4). Visible internal auditory structures appear normal.  Visualized orbit soft tissues are within normal limits. Mastoids are clear. Mild ethmoid sinus mucosal thickening. Visualized bone marrow signal is within normal limits. Visualized scalp soft tissues are within normal limits.  IMPRESSION: 1.  No acute intracranial abnormality. 2. Nonspecific signal changes, but favor due to chronic small vessel ischemia, including evidence of a small chronic right cerebellar lacunar infarct.   Electronically Signed   By: Lars Pinks M.D.   On: 09/16/2013 10:42    Assessment/Plan Principal Problem:   Gait instability unclear etiology ? TIA - CT head and MRI of the brain was done in the ER which showed no acute stroke. MRI showed nonspecific changes due to chronic small vessel ischemia, possible small chronic right cerebellar lacunar infarct. Discussed with neurology, Dr. Aram Beecham who felt possibly a small stroke in posterior fossa/cerebellum and not visualized by MRI - Will admit him to TIA protocol, per neurology, pain repeat MRI of the brain stem  and cerebellum tomorrow, MRA - 2-D echo, carotid Dopplers, obtain B12, folate, hemoglobin A1c, lipid panel - Placed on aspirin 325 mg     Active Problems:   Hypertension: Currently BP elevated 170/81 but patient is not on any antihypertensives outpatient  - For now hold off on antihypertensives, will likely need to be placed on antihypertensive prior to DC if BP still remains elevated     Hyperlipidemia - Obtain lipid panel, continue statin     Alcohol abuse - Patient counseled on is alcohol cessation, obtain B12, folate  - Placed on CIWA protocol, thiamine, folic acid, MVI   DVT prophylaxis:  Lovenox   CODE STATUS: Full  Family Communication: Admission, patients condition and plan of care including tests being ordered have been discussed with the patient who indicates understanding and agree with the plan and Code Status   Further plan will depend as patient's clinical course evolves and further radiologic and laboratory data become available.   Time Spent on Admission: 1hour  Reem Fleury M.D. [facility] 09/16/2013, 1:28 PM Pager: [phone]  If 7PM-7AM, please contact night-coverage [email or web address] Password [email or web address]

## 2013-09-17 LAB — LIPID PANEL
CHOL/HDL RATIO: 3.5 ratio
Cholesterol: 213 mg/dL — ABNORMAL HIGH (ref 0–200)
HDL: 61 mg/dL (ref >39)
LDL Cholesterol: 127 mg/dL — ABNORMAL HIGH (ref 0–99)
Triglycerides: 127 mg/dL (ref <150)
VLDL: 25 mg/dL (ref 0–40)

## 2013-09-17 LAB — GLUCOSE, CAPILLARY: Glucose-Capillary: 113 mg/dL — ABNORMAL HIGH (ref 70–99)

## 2013-09-17 MED ORDER — ASPIRIN 81 MG PO TBEC: 81.0000 mg | DELAYED_RELEASE_TABLET | Freq: Every day | ORAL | Status: DC

## 2013-09-17 MED ORDER — THIAMINE HCL 100 MG PO TABS: 100.0000 mg | ORAL_TABLET | Freq: Every day | ORAL | Status: DC

## 2013-09-17 NOTE — Progress Notes (Addendum)
VASCULAR LAB PRELIMINARY  PRELIMINARY  PRELIMINARY  PRELIMINARY  Carotid duplex completed.    Preliminary report:  No evidence of significant ICA stenosis on the right. There is a 1% to 39% stenosis of the left ICA lower end of scale.Vertebral artery flow is antegrade bilaterally.  Brenen Beigel, RVS 09/17/2013, 9:45 AM

## 2013-09-17 NOTE — Progress Notes (Signed)
NEURO HOSPITALIST PROGRESS NOTE   SUBJECTIVE:                                                                                                                         Patient continues to feel like he he vering to the right but now sates he feels it is due to right leg weakness.  He has no inner ear symptoms, vertigo or full headed sensation.  OBJECTIVE:                                                                                                                           Vital signs in last 24 hours: Temp:  [97.2 F (36.2 C)-98.2 F (36.8 C)] 97.2 F (36.2 C) (02/26 1056) Pulse Rate:  [50-70] 54 (02/26 1056) Resp:  [14-21] 18 (02/26 1056) BP: (127-174)/(70-98) 144/87 mmHg (02/26 1056) SpO2:  [93 %-99 %] 97 % (02/26 1056) Weight:  [87.091 kg (192 lb)] 87.091 kg (192 lb) (02/26 [id number])  Intake/Output from previous day: 02/25 0701 - 02/26 0700 In: 240 [P.O.:240] Out: -  Intake/Output this shift: Total I/O In: 260 [P.O.:260] Out: -  Nutritional status: Cardiac  Past Medical History  Diagnosis Date  . GERD (gastroesophageal reflux disease)   . Impaired fasting glucose   . Pure hypercholesterolemia   . Sleep apnea   . Gait instability 08/2013     Neurologic Exam:  Mental Status: Alert, oriented, thought content appropriate.  Speech fluent without evidence of aphasia.  Able to follow 3 step commands without difficulty. Cranial Nerves: II: Discs flat bilaterally; Visual fields grossly normal, pupils equal, round, reactive to light and accommodation III,IV, VI: ptosis not present, extra-ocular motions intact bilaterally V,VII: smile symmetric, facial light touch sensation normal bilaterally VIII: hearing normal bilaterally IX,X: gag reflex present XI: bilateral shoulder shrug XII: midline tongue extension without atrophy or fasciculations  Motor: Right : Upper extremity   5/5    Left:     Upper extremity   5/5  Lower extremity    5/5     Lower extremity   5/5 Tone and bulk:normal tone throughout; no atrophy noted Sensory: Pinprick and light touch intact throughout, bilaterally Deep Tendon Reflexes:  Right: Upper Extremity   Left: Upper  extremity   biceps (C-5 to C-6) 2/4   biceps (C-5 to C-6) 2/4 tricep (C7) 2/4    triceps (C7) 2/4 Brachioradialis (C6) 2/4  Brachioradialis (C6) 2/4  Lower Extremity Lower Extremity  quadriceps (L-2 to L-4) 2/4   quadriceps (L-2 to L-4) 2/4 Achilles (S1) 2/4   Achilles (S1) 2/4  Plantars: Right: downgoing   Left: downgoing Cerebellar: normal finger-to-nose,  normal heel-to-shin test Gait: walking halls by himself and stable--slight vering to the right.  CV: pulses palpable throughout    Lab Results: Basic Metabolic Panel:  Recent Labs Lab 09/16/13 0730 09/16/13 1438  NA 142  --   K 4.1  --   CL 104  --   CO2 23  --   GLUCOSE 111*  --   BUN 21  --   CREATININE 0.82 0.78  CALCIUM 9.4  --     Liver Function Tests:  Recent Labs Lab 09/16/13 0730  AST 31  ALT 30  ALKPHOS 84  BILITOT 0.3  PROT 7.5  ALBUMIN 3.8   No results found for this basename: LIPASE, AMYLASE,  in the last 168 hours No results found for this basename: AMMONIA,  in the last 168 hours  CBC:  Recent Labs Lab 09/16/13 0730 09/16/13 1438  WBC 6.8 7.3  NEUTROABS 2.7  --   HGB 14.9 15.7  HCT 41.7 43.6  MCV 87.8 87.6  PLT 293 323    Cardiac Enzymes: No results found for this basename: CKTOTAL, CKMB, CKMBINDEX, TROPONINI,  in the last 168 hours  Lipid Panel:  Recent Labs Lab 09/17/13 0425  CHOL 213*  TRIG 127  HDL 61  CHOLHDL 3.5  VLDL 25  LDLCALC 127*    CBG:  Recent Labs Lab 09/16/13 [place]*    Microbiology: No results found for this or any previous visit.  Coagulation Studies: No results found for this basename: LABPROT, INR,  in the last 72 hours  Imaging: Ct Head Wo Contrast  09/16/2013   CLINICAL DATA:  Right-sided numbness and weakness  with leaning towards the right  EXAM: CT HEAD WITHOUT CONTRAST  TECHNIQUE: Contiguous axial images were obtained from the base of the skull through the vertex without intravenous contrast.  COMPARISON:  None.  FINDINGS: The ventricles are normal in size and position. There is no intracranial hemorrhage nor intracranial mass effect. There is no evidence of an evolving ischemic infarction. There is decreased density in the deep white matter of both cerebral hemispheres consistent with changes of mild chronic small vessel ischemia. Slightly asymmetric hypodensity in the anterior aspect of the basal ganglia on the left as compared to the right is demonstrated on images 18 and 19. The cerebellum and brainstem exhibit no acute abnormality.  At bone window settings the observed portions of the paranasal sinuses and mastoid air cells are clear. There is no evidence of an acute skull fracture.  IMPRESSION: 1. There is no definite evidence of an acute ischemic or hemorrhagic event. There is subtle decreased density in the anterior aspect of the basal ganglia on the left as compared to the right which could reflect very early ischemic change but it may reflect the sequelae of chronic small vessel ischemia. MRI would be useful to exclude early ischemic change especially if the patient's symptoms persist. 2. There is no evidence of an acute intracranial hemorrhage. 3. There is no intracranial mass effect or hydrocephalus.   Electronically Signed   By: Jayde Daffin  [place]   On:  09/16/2013 08:08   Mr Jodene Nam Head Wo Contrast  09/16/2013   CLINICAL DATA:  Remote right cerebellar lacunar infarct. Abnormal gait. The patient is leaning to the right wall walking.  EXAM: MRI HEAD WITHOUT CONTRAST  MRA HEAD WITHOUT CONTRAST  TECHNIQUE: Multiplanar, multiecho pulse sequences of the brain and surrounding structures were obtained without intravenous contrast. Angiographic images of the head were obtained using MRA technique without contrast.   COMPARISON:  MRI brain from the same day.  FINDINGS: MRI HEAD FINDINGS  Thin cut coronal imaging through the brainstem and cerebellum demonstrates no evidence for acute or subacute infarction.  MRA HEAD FINDINGS  The internal carotid arteries are within normal limits from the high cervical segments through the ICA termini bilaterally. An azygous ACA system fills from the left. The A1 segment is aplastic. The M1 segments are within normal limits bilaterally. ACA and MCA branch vessels are within normal limits.  The left vertebral artery is dominant. The basilar artery is within normal limits. Right vertebral artery is not visualized and could be occluded. Alternatively and more likely this represents very slow flow within a hypoplastic vessel. Moderate stenosis is present in the right P2 segment. There is mild irregularity in the left PCA without significant stenosis.  IMPRESSION: 1. Absent signal within the right vertebral artery. This may be due to extremely slow flow within a hypoplastic vessel or possible occlusion. 2. The left vertebral artery appears the dominant vessel with normal appearance of the basilar artery. 3. Moderate stenosis of the right posterior cerebral artery with mild narrowing of distal PCA branch vessels bilaterally. 4. The anterior circulation is within normal limits. 5. Thin cut coronal diffusion-weighted images to the brainstem and cerebellum demonstrate no evidence for acute or subacute infarction.   Electronically Signed   By: Lawrence Santiago M.D.   On: 09/16/2013 15:12   Mr Brain Wo Contrast  09/16/2013   CLINICAL DATA:  63 year old male with loss of balance, falling to the right. Abnormal sensation with right side numbness and weakness. Initial encounter.  EXAM: MRI HEAD WITHOUT CONTRAST  TECHNIQUE: Multiplanar, multiecho pulse sequences of the brain and surrounding structures were obtained without intravenous contrast.  COMPARISON:  Head CT without contrast 09/16/2013.  FINDINGS: No  restricted diffusion to suggest acute infarction. No midline shift, mass effect, evidence of mass lesion, ventriculomegaly, extra-axial collection or acute intracranial hemorrhage. Cervicomedullary junction and pituitary are within normal limits. Negative visualized cervical spine. Major intracranial vascular flow voids are preserved; dominant distal left vertebral artery.  Patchy bilateral cerebral white matter T2 and FLAIR hyperintensity. T2 heterogeneity in the deep gray matter nuclei, in part due tib perivascular spaces but also in part similar to the white matter signal abnormality. Questionable small area of chronic cortical encephalomalacia along the right frontal lobe on series 7, image 18. Elsewhere the cerebral cortex appears preserved. Brainstem within normal limits. There is a small chronic lacunar infarct in the inferior right cerebellar hemisphere (series 6, image 4). Visible internal auditory structures appear normal.  Visualized orbit soft tissues are within normal limits. Mastoids are clear. Mild ethmoid sinus mucosal thickening. Visualized bone marrow signal is within normal limits. Visualized scalp soft tissues are within normal limits.  IMPRESSION: 1.  No acute intracranial abnormality. 2. Nonspecific signal changes, but favor due to chronic small vessel ischemia, including evidence of a small chronic right cerebellar lacunar infarct.   Electronically Signed   By: Lars Pinks M.D.   On: 09/16/2013 10:42   Mr [employer]  W/o Cm  09/16/2013   CLINICAL DATA:  Remote right cerebellar lacunar infarct. Abnormal gait. The patient is leaning to the right wall walking.  EXAM: MRI HEAD WITHOUT CONTRAST  MRA HEAD WITHOUT CONTRAST  TECHNIQUE: Multiplanar, multiecho pulse sequences of the brain and surrounding structures were obtained without intravenous contrast. Angiographic images of the head were obtained using MRA technique without contrast.  COMPARISON:  MRI brain from the same day.  FINDINGS: MRI HEAD  FINDINGS  Thin cut coronal imaging through the brainstem and cerebellum demonstrates no evidence for acute or subacute infarction.  MRA HEAD FINDINGS  The internal carotid arteries are within normal limits from the high cervical segments through the ICA termini bilaterally. An azygous ACA system fills from the left. The A1 segment is aplastic. The M1 segments are within normal limits bilaterally. ACA and MCA branch vessels are within normal limits.  The left vertebral artery is dominant. The basilar artery is within normal limits. Right vertebral artery is not visualized and could be occluded. Alternatively and more likely this represents very slow flow within a hypoplastic vessel. Moderate stenosis is present in the right P2 segment. There is mild irregularity in the left PCA without significant stenosis.  IMPRESSION: 1. Absent signal within the right vertebral artery. This may be due to extremely slow flow within a hypoplastic vessel or possible occlusion. 2. The left vertebral artery appears the dominant vessel with normal appearance of the basilar artery. 3. Moderate stenosis of the right posterior cerebral artery with mild narrowing of distal PCA branch vessels bilaterally. 4. The anterior circulation is within normal limits. 5. Thin cut coronal diffusion-weighted images to the brainstem and cerebellum demonstrate no evidence for acute or subacute infarction.   Electronically Signed   By: Lawrence Santiago M.D.   On: 09/16/2013 15:12       MEDICATIONS                                                                                                                        Scheduled: . aspirin EC  81 mg Oral Daily  . aspirin  325 mg Oral Daily  . atorvastatin  20 mg Oral q1800  . cholecalciferol  2,000 Units Oral Daily  . enoxaparin (LOVENOX) injection  40 mg Subcutaneous Q24H  . folic acid  1 mg Oral Daily  . magnesium oxide  400 mg Oral Daily  . multivitamin with minerals  1 tablet Oral Daily  .  pantoprazole  40 mg Oral Daily  . thiamine  100 mg Oral Daily  . vitamin E  400 Units Oral QODAY    ASSESSMENT/PLAN:  64 y.o. male with new onset gait instability (listing to the right). Neuro-exam is not particulrly impressive except for evidence of leaning to the right when walking. MRI obtained was within normal limits showing no abnormalities in the cerebellum or posterior fossa. Repeat MRI brain with thin cuts of brain stem and cerebellum shows no acute infarct. Exam remains non-focal.   Recommend: 1) Follow up with neurology as a out patient for further evaluation Neurology will S/O  Assessment and plan discussed with with attending physician and they are in agreement.    Etta Quill PA-C [facility] [phone]  09/17/2013, 11:48 AM

## 2013-09-17 NOTE — Progress Notes (Signed)
  Echocardiogram 2D Echocardiogram has been performed.  Istvan Behar FRANCES 09/17/2013, 10:12 AM

## 2013-09-17 NOTE — Evaluation (Signed)
Physical Therapy Evaluation Patient Details Name: Caleb Rogers MRN: [id number] DOB: 12/06/1950 Today's Date: 09/17/2013 Time: 7782-4235 PT Time Calculation (min): 12 min  PT Assessment / Plan / Recommendation History of Present Illness  pt presents with gait difficulties and R LE weakness.    Clinical Impression  Pt up ambulating independently in hallway.  Slight deviation to R side during ambulation and pt indicates feeling as though foot wants to invert, but no LOB or difficulties with ambulation.  Pt may need OPPT if symptoms do not resolve by time he follows up with his MD.  No further acute PT needs at this time.  Spoke with OT about pt without OT needs as well.  Will sign off.      PT Assessment  All further PT needs can be met in the next venue of care    Follow Up Recommendations  Outpatient PT    Does the patient have the potential to tolerate intense rehabilitation      Barriers to Discharge        Equipment Recommendations  None recommended by PT    Recommendations for Other Services     Frequency      Precautions / Restrictions Precautions Precautions: None Restrictions Weight Bearing Restrictions: No   Pertinent Vitals/Pain Denied pain.        Mobility  Ambulation/Gait Ambulation/Gait assistance: Independent Ambulation Distance (Feet): 500 Feet Assistive device: None Gait Pattern/deviations: Step-through pattern;Decreased stride length;Drifts right/left General Gait Details: pt up ambulating independently in hallway.  pt with slight drift to R during ambulation, but demos good balance and safety with ambulation.  pt indicates feeling as though foot wants to invert during ambulation.  cues for increased heel strike and to slow gait to work on preventing inversion feeling.   Modified Rankin (Stroke Patients Only) Pre-Morbid Rankin Score: No symptoms Modified Rankin: No significant disability    Exercises     PT Diagnosis: Difficulty walking  PT  Problem List: Decreased mobility;Decreased coordination PT Treatment Interventions:       PT Goals(Current goals can be found in the care plan section) Acute Rehab PT Goals PT Goal Formulation: No goals set, d/c therapy  Visit Information  Last PT Received On: 09/17/13 Assistance Needed: +1 History of Present Illness: pt presents with gait difficulties and R LE weakness.         Prior [place] expects to be discharged to:: Private residence Living Arrangements: Spouse/significant other Available Help at Discharge: Family;Available PRN/intermittently Prior Function Level of Independence: Independent Communication Communication: No difficulties    Cognition  Cognition Arousal/Alertness: Awake/alert Behavior During Therapy: WFL for tasks assessed/performed Overall Cognitive Status: Within Functional Limits for tasks assessed    Extremity/Trunk Assessment Upper Extremity Assessment Upper Extremity Assessment: Overall WFL for tasks assessed Lower Extremity Assessment Lower Extremity Assessment: Overall WFL for tasks assessed Cervical / Trunk Assessment Cervical / Trunk Assessment: Normal   Balance Balance Overall balance assessment: Modified Independent General Comments General comments (skin integrity, edema, etc.): pt able to stand on one leg and adjust sock withou single UE support and no LOB or deviations.    End of Session PT - End of Session Activity Tolerance: Patient tolerated treatment well Patient left:  (ambulating in hallway) Nurse Communication: Mobility status  GP Functional Assessment Tool Used: Clinical Judgement Functional Limitation: Mobility: Walking and moving around Mobility: Walking and Moving Around Current Status (T6144): 0 percent impaired, limited or restricted Mobility: Walking and Moving Around Goal Status (R1540): 0  percent impaired, limited or restricted Mobility: Walking and Moving Around Discharge Status  [phone]): 0 percent impaired, limited or restricted   Catarina Hartshorn, [place] [phone] 09/17/2013, 8:39 AM

## 2013-09-17 NOTE — Discharge Summary (Signed)
Physician Discharge Summary  Caleb Rogers [id number] DOB: Mar 30, 1951 DOA: 09/16/2013  PCP: Stephens Shire, MD  Admit date: 09/16/2013 Discharge date: 09/17/2013  Time spent: 35 minutes  Recommendations for Outpatient Follow-up:  1. Limit alcohol 2. [facility] follow up   Discharge Diagnoses:  Principal Problem:   Gait instability Active Problems:   Hypertension   Hyperlipidemia   Alcohol abuse   TIA (transient ischemic attack)   Discharge Condition: improved  Diet recommendation: cardiac  Filed Weights   09/17/13 0608  Weight: 87.091 kg (192 lb)    History of present illness:  Patient is a 63 year old male with history of hyperlipidemia, GERD with no prior history of stroke presented to the ER with difficulty walking and leaning towards the right since morning. History was obtained from the patient who stated that he was in his baseline state of health until last night, he woke up this morning around 6:20 AM. Once he got up from the bed and walk, he noticed difficulty walking and leaning towards the right side. He denied any headache, confusion, trouble speaking or difficulty eating. He denied any vertigo or any trouble with vision.   Hospital Course:  63 y.o. male with new onset gait instability (listing to the right). Neuro-exam is not particulrly impressive except for evidence of leaning to the right when walking. MRI obtained was within normal limits showing no abnormalities in the cerebellum or posterior fossa. Repeat MRI brain with thin cuts of brain stem and cerebellum shows no acute infarct. Exam remains non-focal.  Recommend:  1) Follow up with neurology as a out patient for further evaluation   Procedures:  MRI  Echo   Consultations:  neuro  Discharge Exam: Filed Vitals:   09/17/13 1056  BP: 144/87  Pulse: 54  Temp: 97.2 F (36.2 C)  Resp: 18    General: A+Ox3, NAD Cardiovascular: rrr Respiratory: clear anterior  Discharge Instructions       Discharge Orders   Future Orders Complete By Expires   Diet - low sodium heart healthy  As directed    Increase activity slowly  As directed        Medication List    STOP taking these medications       Potassium 99 MG Tabs      TAKE these medications       Acetyl L-Carnitine 500 MG Caps  Take 500 mg by mouth every other day.     Alpha Lipoic Acid 200 MG Caps  Take 200 mg by mouth every other day.     aspirin 81 MG EC tablet  Take 1 tablet (81 mg total) by mouth daily.     atorvastatin 40 MG tablet  Commonly known as:  LIPITOR  Take 20 mg by mouth daily.     Fish Oil 1200 MG Caps  Take 1,200 mg by mouth every other day.     Magnesium 500 MG Caps  Take 500 mg by mouth every other day.     multivitamin with minerals Tabs tablet  Take 0.5 tablets by mouth every other day.     omeprazole 20 MG capsule  Commonly known as:  PRILOSEC  Take 20 mg by mouth daily.     SUPER B COMPLEX PO  Take 0.5 tablets by mouth every other day.     thiamine 100 MG tablet  Take 1 tablet (100 mg total) by mouth daily.     Vitamin D3 2000 UNITS Tabs  Take 2,000 Units by mouth every other  day.     vitamin E 400 UNIT capsule  Take 400 Units by mouth every other day.       No Known Allergies Follow-up Information   Follow up with BURNETT,BRENT A, MD In 1 week.   Specialty:  Family Medicine   Contact information:   [address] [place] [address] [phone]       Follow up with [facility] In 1 month.   Specialty:  Neurology   Contact information:   [address] [place] [address] [phone]       The results of significant diagnostics from this hospitalization (including imaging, microbiology, ancillary and laboratory) are listed below for reference.    Significant Diagnostic Studies: Ct Head Wo Contrast  09/16/2013   CLINICAL DATA:  Right-sided numbness and weakness with leaning towards the right  EXAM: CT  HEAD WITHOUT CONTRAST  TECHNIQUE: Contiguous axial images were obtained from the base of the skull through the vertex without intravenous contrast.  COMPARISON:  None.  FINDINGS: The ventricles are normal in size and position. There is no intracranial hemorrhage nor intracranial mass effect. There is no evidence of an evolving ischemic infarction. There is decreased density in the deep white matter of both cerebral hemispheres consistent with changes of mild chronic small vessel ischemia. Slightly asymmetric hypodensity in the anterior aspect of the basal ganglia on the left as compared to the right is demonstrated on images 18 and 19. The cerebellum and brainstem exhibit no acute abnormality.  At bone window settings the observed portions of the paranasal sinuses and mastoid air cells are clear. There is no evidence of an acute skull fracture.  IMPRESSION: 1. There is no definite evidence of an acute ischemic or hemorrhagic event. There is subtle decreased density in the anterior aspect of the basal ganglia on the left as compared to the right which could reflect very early ischemic change but it may reflect the sequelae of chronic small vessel ischemia. MRI would be useful to exclude early ischemic change especially if the patient's symptoms persist. 2. There is no evidence of an acute intracranial hemorrhage. 3. There is no intracranial mass effect or hydrocephalus.   Electronically Signed   By: David  Martinique   On: 09/16/2013 08:08   Mr Jodene Nam Head Wo Contrast  09/16/2013   CLINICAL DATA:  Remote right cerebellar lacunar infarct. Abnormal gait. The patient is leaning to the right wall walking.  EXAM: MRI HEAD WITHOUT CONTRAST  MRA HEAD WITHOUT CONTRAST  TECHNIQUE: Multiplanar, multiecho pulse sequences of the brain and surrounding structures were obtained without intravenous contrast. Angiographic images of the head were obtained using MRA technique without contrast.  COMPARISON:  MRI brain from the same day.   FINDINGS: MRI HEAD FINDINGS  Thin cut coronal imaging through the brainstem and cerebellum demonstrates no evidence for acute or subacute infarction.  MRA HEAD FINDINGS  The internal carotid arteries are within normal limits from the high cervical segments through the ICA termini bilaterally. An azygous ACA system fills from the left. The A1 segment is aplastic. The M1 segments are within normal limits bilaterally. ACA and MCA branch vessels are within normal limits.  The left vertebral artery is dominant. The basilar artery is within normal limits. Right vertebral artery is not visualized and could be occluded. Alternatively and more likely this represents very slow flow within a hypoplastic vessel. Moderate stenosis is present in the right P2 segment. There is mild irregularity  in the left PCA without significant stenosis.  IMPRESSION: 1. Absent signal within the right vertebral artery. This may be due to extremely slow flow within a hypoplastic vessel or possible occlusion. 2. The left vertebral artery appears the dominant vessel with normal appearance of the basilar artery. 3. Moderate stenosis of the right posterior cerebral artery with mild narrowing of distal PCA branch vessels bilaterally. 4. The anterior circulation is within normal limits. 5. Thin cut coronal diffusion-weighted images to the brainstem and cerebellum demonstrate no evidence for acute or subacute infarction.   Electronically Signed   By: Lawrence Santiago M.D.   On: 09/16/2013 15:12   Mr Brain Wo Contrast  09/16/2013   CLINICAL DATA:  63 year old male with loss of balance, falling to the right. Abnormal sensation with right side numbness and weakness. Initial encounter.  EXAM: MRI HEAD WITHOUT CONTRAST  TECHNIQUE: Multiplanar, multiecho pulse sequences of the brain and surrounding structures were obtained without intravenous contrast.  COMPARISON:  Head CT without contrast 09/16/2013.  FINDINGS: No restricted diffusion to suggest acute  infarction. No midline shift, mass effect, evidence of mass lesion, ventriculomegaly, extra-axial collection or acute intracranial hemorrhage. Cervicomedullary junction and pituitary are within normal limits. Negative visualized cervical spine. Major intracranial vascular flow voids are preserved; dominant distal left vertebral artery.  Patchy bilateral cerebral white matter T2 and FLAIR hyperintensity. T2 heterogeneity in the deep gray matter nuclei, in part due tib perivascular spaces but also in part similar to the white matter signal abnormality. Questionable small area of chronic cortical encephalomalacia along the right frontal lobe on series 7, image 18. Elsewhere the cerebral cortex appears preserved. Brainstem within normal limits. There is a small chronic lacunar infarct in the inferior right cerebellar hemisphere (series 6, image 4). Visible internal auditory structures appear normal.  Visualized orbit soft tissues are within normal limits. Mastoids are clear. Mild ethmoid sinus mucosal thickening. Visualized bone marrow signal is within normal limits. Visualized scalp soft tissues are within normal limits.  IMPRESSION: 1.  No acute intracranial abnormality. 2. Nonspecific signal changes, but favor due to chronic small vessel ischemia, including evidence of a small chronic right cerebellar lacunar infarct.   Electronically Signed   By: Lars Pinks M.D.   On: 09/16/2013 10:42   Mr Brain Ltd W/o Cm  09/16/2013   CLINICAL DATA:  Remote right cerebellar lacunar infarct. Abnormal gait. The patient is leaning to the right wall walking.  EXAM: MRI HEAD WITHOUT CONTRAST  MRA HEAD WITHOUT CONTRAST  TECHNIQUE: Multiplanar, multiecho pulse sequences of the brain and surrounding structures were obtained without intravenous contrast. Angiographic images of the head were obtained using MRA technique without contrast.  COMPARISON:  MRI brain from the same day.  FINDINGS: MRI HEAD FINDINGS  Thin cut coronal imaging  through the brainstem and cerebellum demonstrates no evidence for acute or subacute infarction.  MRA HEAD FINDINGS  The internal carotid arteries are within normal limits from the high cervical segments through the ICA termini bilaterally. An azygous ACA system fills from the left. The A1 segment is aplastic. The M1 segments are within normal limits bilaterally. ACA and MCA branch vessels are within normal limits.  The left vertebral artery is dominant. The basilar artery is within normal limits. Right vertebral artery is not visualized and could be occluded. Alternatively and more likely this represents very slow flow within a hypoplastic vessel. Moderate stenosis is present in the right P2 segment. There is mild irregularity in the left PCA without significant stenosis.  IMPRESSION: 1. Absent signal within the right vertebral artery. This may be due to extremely slow flow within a hypoplastic vessel or possible occlusion. 2. The left vertebral artery appears the dominant vessel with normal appearance of the basilar artery. 3. Moderate stenosis of the right posterior cerebral artery with mild narrowing of distal PCA branch vessels bilaterally. 4. The anterior circulation is within normal limits. 5. Thin cut coronal diffusion-weighted images to the brainstem and cerebellum demonstrate no evidence for acute or subacute infarction.   Electronically Signed   By: Lawrence Santiago M.D.   On: 09/16/2013 15:12    Microbiology: No results found for this or any previous visit (from the past 240 hour(s)).   Labs: Basic Metabolic Panel:  Recent Labs Lab 09/16/13 0730 09/16/13 1438  NA 142  --   K 4.1  --   CL 104  --   CO2 23  --   GLUCOSE 111*  --   BUN 21  --   CREATININE 0.82 0.78  CALCIUM 9.4  --    Liver Function Tests:  Recent Labs Lab 09/16/13 0730  AST 31  ALT 30  ALKPHOS 84  BILITOT 0.3  PROT 7.5  ALBUMIN 3.8   No results found for this basename: LIPASE, AMYLASE,  in the last 168  hours No results found for this basename: AMMONIA,  in the last 168 hours CBC:  Recent Labs Lab 09/16/13 0730 09/16/13 1438  WBC 6.8 7.3  NEUTROABS 2.7  --   HGB 14.9 15.7  HCT 41.7 43.6  MCV 87.8 87.6  PLT 293 323   Cardiac Enzymes: No results found for this basename: CKTOTAL, CKMB, CKMBINDEX, TROPONINI,  in the last 168 hours BNP: BNP (last 3 results) No results found for this basename: PROBNP,  in the last 8760 hours CBG:  Recent Labs Lab 09/16/13 1632 09/17/13 1121  GLUCAP 124* 113*       Signed:  VANN, JESSICA  [facility] 09/17/2013, 2:27 PM

## 2013-09-17 NOTE — Progress Notes (Signed)
UR completed 

## 2013-09-17 NOTE — Progress Notes (Signed)
Patient d/c at this time. All assessments remain unchanged. Discharge instructions given and duly signed. Educated on the need to follow up after discharge and the need to take medications.

## 2013-09-17 NOTE — Progress Notes (Signed)
Talked to patient about Outpatient physical therapy with spouse present; patient does not want any therapy at this time. CM informed patient that if he changed his mind his PCP can make the arrangements after discharge; Aneta Mins [phone]

## 2013-09-21 ENCOUNTER — Ambulatory Visit (INDEPENDENT_AMBULATORY_CARE_PROVIDER_SITE_OTHER): Payer: BC Managed Care – PPO | Admitting: Neurology

## 2013-09-21 ENCOUNTER — Encounter: Payer: Self-pay | Admitting: Neurology

## 2013-09-21 VITALS — BP 150/82 | HR 52 | Ht 70.0 in | Wt 193.0 lb

## 2013-09-21 DIAGNOSIS — G459 Transient cerebral ischemic attack, unspecified: Secondary | ICD-10-CM

## 2013-09-21 NOTE — Progress Notes (Signed)
[facility]    Provider:  Dr Janann Colonel Referring Provider: Stephens Shire, MD Primary Care Physician:  Stephens Shire, MD  CC:  ? TIA  HPI:  Caleb Rogers is a 63 y.o. male here as a referral from Dr. Tollie Pizza for ? TIA  Woke up few weeeks ago, noticed difficulty using right leg, felt uncoordinated, presented to the ED. Symptoms quickly improved but baseline gait instability remained for around 1 to 2 days  The few days prior to this event had flu like symptoms. MRI in the ED showed no acute process but question of old R cerebellar infarct. Patient feels he is currently back to baseline. Notes no prior known history of stroke or TIA. Exercises on a regular basis. Has history of HLD, drinks 4 glasses of wine daily. Does not smoke.    Per hospital notes: Caleb Rogers is an 63 y.o. male who admits to drinking 4 glasses of wine daily but does not smoke. Patient went to sleep last night feeling well. He awoke this am and noted he was listing to the right when he was walking. He denies any inner ear infection, fullness, ringing, vertigo, diplopia, dysphagia. He also denies any decreased sensation or weakness. He states he has never had symptoms like this prior.   MRI and A brain were overall unremarkable, showed remote R cerebellar infarct, absent signal within R vert.   Review of Systems: Out of a complete 14 system review, the patient complains of only the following symptoms, and all other reviewed systems are negative. + snoring  History   Social History  . Marital Status: Married    Spouse Name: Vicente Males    Number of Children: 2  . Years of Education: college   Occupational History  . 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 Smoking status: Former Research scientist (life sciences)  . Smokeless tobacco: Never Used     Comment: pt quit July 1997  . Alcohol Use: 1.8 oz/week    3 Glasses of wine per week     Comment: SOCIAL  . Drug Use: No  . Sexual  Activity: Yes    Partners: Female   Other Topics Concern  . Not on file   Social History Narrative  . No narrative on file    Family History  Problem Relation Age of Onset  . Cancer Mother   . Congestive Heart Failure Mother   . Coronary artery disease Father   . Coronary artery disease Brother 27    Past Medical History  Diagnosis Date  . GERD (gastroesophageal reflux disease)   . Impaired fasting glucose   . Pure hypercholesterolemia   . Sleep apnea   . Gait instability 08/2013    Past Surgical History  Procedure Laterality Date  . None    . Colonoscopy      Current Outpatient Prescriptions  Medication Sig Dispense Refill  . Acetylcarnitine HCl (ACETYL L-CARNITINE) 500 MG CAPS Take 500 mg by mouth every other day.      Ilean Skill Lipoic Acid 200 MG CAPS Take 200 mg by mouth every other day.      Marland Kitchen aspirin EC 81 MG EC tablet Take 1 tablet (81 mg total) by mouth daily.      Marland Kitchen atorvastatin (LIPITOR) 40 MG tablet Take 20 mg by mouth daily.       . B Complex-C (SUPER B COMPLEX PO) Take 0.5 tablets by mouth every other day.      Marland Kitchen  Cholecalciferol (VITAMIN D3) 2000 UNITS TABS Take 2,000 Units by mouth every other day.      . Magnesium 500 MG CAPS Take 500 mg by mouth every other day.      . Multiple Vitamin (MULTIVITAMIN WITH MINERALS) TABS tablet Take 0.5 tablets by mouth every other day.      . Omega-3 Fatty Acids (FISH OIL) 1200 MG CAPS Take 1,200 mg by mouth every other day.      Marland Kitchen omeprazole (PRILOSEC) 20 MG capsule Take 20 mg by mouth daily.      . vitamin E 400 UNIT capsule Take 400 Units by mouth every other day.       No current facility-administered medications for this visit.    Allergies as of 09/21/2013  . (No Known Allergies)    Vitals: BP 150/82  Pulse 52  Ht 5\' 10"  (1.778 m)  Wt 193 lb (87.544 kg)  BMI 27.69 kg/m2 Last Weight:  Wt Readings from Last 1 Encounters:  09/21/13 193 lb (87.544 kg)   Last Height:   Ht Readings from Last 1 Encounters:    09/21/13 5\' 10"  (1.778 m)     Physical exam: Exam: Gen: NAD, conversant Eyes: anicteric sclerae, moist conjunctivae HENT: Atraumatic, oropharynx clear Neck: Trachea midline; supple,  Lungs: CTA, no wheezing, rales, rhonic                          CV: RRR, no MRG Abdomen: Soft, non-tender;  Extremities: No peripheral edema  Skin: Normal temperature, no rash,  Psych: Appropriate affect, pleasant  Neuro: MS: AA&Ox3, appropriately interactive, normal affect   Speech: fluent w/o paraphasic error  Memory: good recent and remote recall  CN: PERRL, EOMI no nystagmus, no ptosis, sensation intact to LT V1-V3 bilat, face symmetric, no weakness, hearing grossly intact, palate elevates symmetrically, shoulder shrug 5/5 bilat,  tongue protrudes midline, no fasiculations noted.  Motor: normal bulk and tone Strength: 5/5  In all extremities  Coord: rapid alternating and point-to-point (FNF, HTS) movements intact.  Reflexes: symmetrical, bilat downgoing toes  Sens: LT intact in all extremities  Gait: posture, stance, stride and arm-swing normal. Few missteps with tandem. Able to walk on heels and toes. Romberg absent.   Assessment:  After physical and neurologic examination, review of laboratory studies, imaging, neurophysiology testing and pre-existing records, assessment will be reviewed on the problem list.  Plan:  Treatment plan and additional workup will be reviewed under Problem List.  1)TIA  62y/o gentleman presenting for initial evaluation of suspected TIA. Had complete evaluation in the hospital which was overall unremarkable. Symptoms sound consistent with TIA, though duration is long. Differential would also include possible exacerbation of prior R cerebellar infarct. Continue on daily ASA 81mg  and lipitor. No indication to switch to plavix or dual anti-platelet therapy at this time. Counseled patient to decrease EtOH intake. Continue exercise. Follow up as needed.    Jim Like, DO  [facility] [address] [place], [place] [address]  Phone [phone] Fax [phone]

## 2013-11-06 ENCOUNTER — Ambulatory Visit: Payer: BC Managed Care – PPO | Admitting: Family Medicine

## 2014-02-01 ENCOUNTER — Encounter: Payer: Self-pay | Admitting: Neurology

## 2014-02-18 ENCOUNTER — Ambulatory Visit: Payer: BC Managed Care – PPO | Attending: Family Medicine | Admitting: Physical Therapy

## 2014-02-18 DIAGNOSIS — E7800 Pure hypercholesterolemia, unspecified: Secondary | ICD-10-CM | POA: Diagnosis not present

## 2014-02-18 DIAGNOSIS — Z8673 Personal history of transient ischemic attack (TIA), and cerebral infarction without residual deficits: Secondary | ICD-10-CM | POA: Diagnosis not present

## 2014-02-18 DIAGNOSIS — R109 Unspecified abdominal pain: Secondary | ICD-10-CM | POA: Insufficient documentation

## 2014-02-18 DIAGNOSIS — IMO0001 Reserved for inherently not codable concepts without codable children: Secondary | ICD-10-CM | POA: Diagnosis present

## 2014-02-18 DIAGNOSIS — M25559 Pain in unspecified hip: Secondary | ICD-10-CM | POA: Insufficient documentation

## 2014-02-19 ENCOUNTER — Ambulatory Visit: Payer: BC Managed Care – PPO | Admitting: Neurology

## 2014-02-22 ENCOUNTER — Ambulatory Visit: Payer: BC Managed Care – PPO | Attending: Family Medicine

## 2014-02-22 DIAGNOSIS — M25559 Pain in unspecified hip: Secondary | ICD-10-CM | POA: Insufficient documentation

## 2014-02-22 DIAGNOSIS — Z8673 Personal history of transient ischemic attack (TIA), and cerebral infarction without residual deficits: Secondary | ICD-10-CM | POA: Insufficient documentation

## 2014-02-22 DIAGNOSIS — IMO0001 Reserved for inherently not codable concepts without codable children: Secondary | ICD-10-CM | POA: Diagnosis present

## 2014-02-22 DIAGNOSIS — R109 Unspecified abdominal pain: Secondary | ICD-10-CM | POA: Insufficient documentation

## 2014-02-22 DIAGNOSIS — E7800 Pure hypercholesterolemia, unspecified: Secondary | ICD-10-CM | POA: Insufficient documentation

## 2014-02-24 ENCOUNTER — Ambulatory Visit (INDEPENDENT_AMBULATORY_CARE_PROVIDER_SITE_OTHER): Payer: BC Managed Care – PPO | Admitting: Neurology

## 2014-02-24 ENCOUNTER — Encounter: Payer: Self-pay | Admitting: Neurology

## 2014-02-24 VITALS — BP 141/85 | HR 54 | Ht 70.0 in | Wt 192.0 lb

## 2014-02-24 DIAGNOSIS — Z9989 Dependence on other enabling machines and devices: Principal | ICD-10-CM

## 2014-02-24 DIAGNOSIS — G4733 Obstructive sleep apnea (adult) (pediatric): Secondary | ICD-10-CM

## 2014-02-24 HISTORY — DX: Obstructive sleep apnea (adult) (pediatric): G47.33

## 2014-02-24 NOTE — Patient Instructions (Signed)

## 2014-02-24 NOTE — Progress Notes (Signed)
[facility]    Provider:  Dr Janann Colonel Referring Provider: Stephens Shire, MD Primary Care Physician:  Stephens Shire, MD  CC:  ? TIA, OSA   HPI:  Caleb Rogers is a 63 y.o. male here as a revisit for CPAP compliance. OSA, on CPAP ; In office download.  The patient improved his compliance to 5 hours and 44 minutes at night has a residual AHI of 0.9 AHI at a CPAP pressure of 7 cm water his compliance was 100%.  He reports sleeping sounder , and his wife feels strongly he is less restless. His machine is quiet works and is unobtrusive.  2 days prior to his TIA he drank heavier and wanted to know if this was his risk.  He is regularly dancing Zumba and has been working outdoors. He may have gotten dehydrated.     Last visit note :  Woke up few weeeks ago, noticed difficulty using right leg, felt uncoordinated, presented to the ED. Symptoms quickly improved but baseline gait instability remained for around 1 to 2 days The few days prior to this event had flu like symptoms. MRI in the ED showed no acute process but question of old R cerebellar infarct.  Patient feels he is currently back to baseline.  Notes no prior known history of stroke or TIA. Exercises on a regular basis. Has history of HLD, drinks 4 glasses of wine daily. Does not smoke.     Per hospital notes: Caleb Rogers is an 63 y.o. male who admits to drinking 4 glasses of wine daily but does not smoke. Patient went to sleep last night feeling well. He awoke this am and noted he was listing to the right when he was walking. He denies any inner ear infection, fullness, ringing, vertigo, diplopia, dysphagia. He also denies any decreased sensation or weakness. He states he has never had symptoms like this prior.   MRI and A brain were overall unremarkable, showed remote R cerebellar infarct, absent signal within R vert.   Review of Systems: Out of a complete 14 system review, the patient complains of only the  following symptoms, and all other reviewed systems are negative. + snoring  History   Social History  . Marital Status: Married    Spouse Name: Caleb Rogers    Number of Children: 2  . Years of Education: college   Occupational History  . 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 Smoking status: Former Research scientist (life sciences)  . Smokeless tobacco: Never Used     Comment: pt quit July 1997  . Alcohol Use: 1.8 oz/week    3 Glasses of wine per week     Comment: SOCIAL  . Drug Use: No  . Sexual Activity: Yes    Partners: Female   Other Topics Concern  . Not on file   Social History Narrative   Patient is married Caleb Rogers) and lives at home with his wife.   Patient has two adult children.   Patient is working full-time.   Patient has a college education.   Patient is right- handed.   Patient drinks two cups of coffee daily.    Family History  Problem Relation Age of Onset  . Cancer Mother   . Congestive Heart Failure Mother   . Coronary artery disease Father   . Coronary artery disease Brother 68    Past Medical History  Diagnosis Date  . GERD (gastroesophageal reflux disease)   . Impaired  fasting glucose   . Pure hypercholesterolemia   . Sleep apnea   . Gait instability 08/2013    Past Surgical History  Procedure Laterality Date  . None    . Colonoscopy      Current Outpatient Prescriptions  Medication Sig Dispense Refill  . Acetylcarnitine HCl (ACETYL L-CARNITINE) 500 MG CAPS Take 500 mg by mouth every other day.      Ilean Skill Lipoic Acid 200 MG CAPS Take 200 mg by mouth every other day.      Marland Kitchen aspirin EC 81 MG EC tablet Take 1 tablet (81 mg total) by mouth daily.      Marland Kitchen atorvastatin (LIPITOR) 40 MG tablet Take 20 mg by mouth daily.       . B Complex-C (SUPER B COMPLEX PO) Take 0.5 tablets by mouth every other day.      . Cholecalciferol (VITAMIN D3) 2000 UNITS TABS Take 2,000 Units by mouth every other day.      . Magnesium 500 MG CAPS Take 500 mg by  mouth every other day.      . Multiple Vitamin (MULTIVITAMIN WITH MINERALS) TABS tablet Take 0.5 tablets by mouth every other day.      . Omega-3 Fatty Acids (FISH OIL) 1200 MG CAPS Take 1,200 mg by mouth every other day.      Marland Kitchen omeprazole (PRILOSEC) 20 MG capsule Take 20 mg by mouth daily.      . vitamin E 400 UNIT capsule Take 400 Units by mouth every other day.       No current facility-administered medications for this visit.    Allergies as of 02/24/2014  . (No Known Allergies)    Vitals: BP 141/85  Pulse 54  Ht 5\' 10"  (1.778 m)  Wt 192 lb (87.091 kg)  BMI 27.55 kg/m2 Last Weight:  Wt Readings from Last 1 Encounters:  02/24/14 192 lb (87.091 kg)   Last Height:   Ht Readings from Last 1 Encounters:  02/24/14 5\' 10"  (1.778 m)     Physical exam: Exam: Gen: NAD, conversant Eyes: anicteric sclerae, moist conjunctivae HENT: Atraumatic, oropharynx clear Neck: Trachea midline; supple,  Lungs: CTA, no wheezing, rales, rhonic                          CV: RRR, no MRG Abdomen: Soft, non-tender;  Extremities: No peripheral edema , neck circumference. 15.75 inches , mild retrognathia/. Skin: Normal temperature, no rash,  Psych: Appropriate affect, pleasant  Neuro:  MS: AA&Ox3, appropriately interactive, normal affect   Speech: fluent w/o paraphasic error  Memory: good recent and remote recall  CN: PERRL, EOMI no nystagmus, no ptosis, sensation intact to LT V1-V3 bilat,  face symmetric,  no weakness, hearing grossly intact,   palate elevates symmetrically, shoulder shrug 5/5 bilat,  tongue protrudes midline, no fasiculations noted.  Motor: normal bulk and tone Strength: 5/5  In all extremities  Coord: rapid alternating and point-to-point (FNF, HTS) movements intact.  Reflexes: symmetrical, bilat downgoing toes  Sens: LT intact in all extremities  Gait: posture, stance, stride and arm-swing normal. Few missteps with tandem. Able to walk on heels and toes.  Romberg absent.   Assessment:  After physical and neurologic examination, review of laboratory studies, imaging, neurophysiology testing and pre-existing records, assessment will be reviewed on the problem list.  OSA:    TIA :   Plan:  Treatment plan and additional workup will be reviewed under Problem List.  OSA, on CPAP ; the patient improved his compliance to 4 hours and 50 minutes at night has a residual AHI of 0.9 AHI at a CPAP pressure of 7 cm water his compliance was 96.7%.   Last visit with Dr Janann Colonel for TIA:   62y/o gentleman presenting for initial evaluation of suspected TIA. Had complete evaluation in the hospital which was overall unremarkable.  Symptoms sound consistent with TIA, though duration is long. Differential would also include possible exacerbation of prior R cerebellar infarct.  Continue on daily ASA 81mg  and lipitor. No indication to switch to plavix or dual anti-platelet therapy at this time.   Larey Seat MD    [facility] [address] [place], [place] [address]  Phone [phone] Fax [phone]

## 2014-02-25 ENCOUNTER — Ambulatory Visit: Payer: BC Managed Care – PPO

## 2014-02-25 DIAGNOSIS — IMO0001 Reserved for inherently not codable concepts without codable children: Secondary | ICD-10-CM | POA: Diagnosis not present

## 2014-03-02 ENCOUNTER — Ambulatory Visit: Payer: BC Managed Care – PPO | Admitting: Physical Therapy

## 2014-03-02 DIAGNOSIS — IMO0001 Reserved for inherently not codable concepts without codable children: Secondary | ICD-10-CM | POA: Diagnosis not present

## 2014-03-05 ENCOUNTER — Ambulatory Visit: Payer: BC Managed Care – PPO | Admitting: Physical Therapy

## 2014-09-27 ENCOUNTER — Encounter: Payer: Self-pay | Admitting: Neurology

## 2014-09-27 ENCOUNTER — Ambulatory Visit (INDEPENDENT_AMBULATORY_CARE_PROVIDER_SITE_OTHER): Payer: BLUE CROSS/BLUE SHIELD | Admitting: Neurology

## 2014-09-27 VITALS — BP 169/83 | HR 59 | Resp 16 | Ht 70.0 in | Wt 200.0 lb

## 2014-09-27 DIAGNOSIS — G459 Transient cerebral ischemic attack, unspecified: Secondary | ICD-10-CM

## 2014-09-27 DIAGNOSIS — G4733 Obstructive sleep apnea (adult) (pediatric): Secondary | ICD-10-CM | POA: Diagnosis not present

## 2014-09-27 DIAGNOSIS — Z9989 Dependence on other enabling machines and devices: Principal | ICD-10-CM

## 2014-09-27 NOTE — Progress Notes (Signed)
[facility]    Provider:  Dr Janann Colonel Referring Provider: Stephens Shire, MD Primary Care Physician:  Stephens Shire, MD  CC:  ? TIA, OSA   HPI:  Caleb Rogers is a 64 y.o. male here as a revisit for CPAP compliance.  OSA, patient seen on  09-27-14 and the patient brought me a download of his 30 day user time. He is on a C-Flex machine uses this on average 5 hours and 12 minutes nightly. His average AHI is 0.6 the CPAP pressure is set at 7 cm water and the patient has an 83% compliance for days and over 4 hours of use. His compliance a satisfying for the insurance company. He also has a ramp time of 15 minutes starting at 4 cm water pressure and increasing to a final pressure of 7 cm water. The needs to be no adjustments made to this patient's CPAP . T.I.A  - recurrence 2 weeks ago, Feb 2016, left tongue and cheek, left toe and forfinger became numb, resolved over 14 days. He was on ASA. MRI brain. Ordered.     Last visit note : CD  Woke up few weeeks ago, noticed difficulty using right leg, felt uncoordinated, presented to the ED. Symptoms quickly improved but baseline gait instability remained for around 1 to 2 days The few days prior to this event had flu like symptoms. MRI in the ED showed no acute process but question of old R cerebellar infarct.  Patient feels he is currently back to baseline.  Notes no prior known history of stroke or TIA. Exercises on a regular basis. Has history of HLD, drinks 4 glasses of wine daily. Does not smoke. In office download.  The patient improved his compliance to 5 hours and 44 minutes at night has a residual AHI of 0.9 AHI at a CPAP pressure of 7 cm water his compliance was 100%. He reports sleeping sounder , and his wife feels strongly he is less restless. His machine is quiet works and is unobtrusive.  2 days prior to his TIA he drank heavier and wanted to know if this was his risk.  He is regularly dancing Zumba and has been  working outdoors. He may have gotten dehydrated.    Per hospital notes: Caleb Rogers is an 64 y.o. male who admits to drinking 4 glasses of wine daily but does not smoke. Patient went to sleep last night feeling well. He awoke this am and noted he was listing to the right when he was walking. He denies any inner ear infection, fullness, ringing, vertigo, diplopia, dysphagia. He also denies any decreased sensation or weakness. He states he has never had symptoms like this prior.   MRI and A brain were overall unremarkable, showed remote R cerebellar infarct, absent signal within R vert.   Review of Systems: Out of a complete 14 system review, the patient complains of only the following symptoms, and all other reviewed systems are negative. + snoring  History   Social History  . Marital Status: Married    Spouse Name: Caleb Rogers  . Number of Children: 2  . Years of Education: college   Occupational History  . 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 Smoking status: Former Smoker    Quit date: 07/23/1968  . Smokeless tobacco: Never Used     Comment: pt quit July 1997  . Alcohol Use: 1.8 oz/week    3 Glasses of wine per week  Comment: SOCIAL  . Drug Use: No  . Sexual Activity:    Partners: Female   Other Topics Concern  . Not on file   Social History Narrative   Patient is married Caleb Rogers) and lives at home with his wife.   Patient has two adult children.   Patient is working full-time.   Patient has a college education.   Patient is right- handed.   Patient drinks two cups of coffee daily.    Family History  Problem Relation Age of Onset  . Cancer Mother   . Congestive Heart Failure Mother   . Coronary artery disease Father   . Coronary artery disease Brother 32    Past Medical History  Diagnosis Date  . GERD (gastroesophageal reflux disease)   . Impaired fasting glucose   . Pure hypercholesterolemia   . Sleep apnea   . Gait  instability 08/2013  . OSA (obstructive sleep apnea) 02/24/2014    Past Surgical History  Procedure Laterality Date  . None    . Colonoscopy      Current Outpatient Prescriptions  Medication Sig Dispense Refill  . Acetylcarnitine HCl (ACETYL L-CARNITINE) 500 MG CAPS Take 500 mg by mouth every other day.    Ilean Skill Lipoic Acid 200 MG CAPS Take 200 mg by mouth every other day.    Marland Kitchen aspirin EC 81 MG EC tablet Take 1 tablet (81 mg total) by mouth daily.    Marland Kitchen atorvastatin (LIPITOR) 40 MG tablet Take 20 mg by mouth daily.     . B Complex-C (SUPER B COMPLEX PO) Take 0.5 tablets by mouth every other day.    . Cholecalciferol (VITAMIN D3) 2000 UNITS TABS Take 2,000 Units by mouth every other day.    . Magnesium 500 MG CAPS Take 500 mg by mouth every other day.    . Multiple Vitamin (MULTIVITAMIN WITH MINERALS) TABS tablet Take 0.5 tablets by mouth every other day.    . Omega-3 Fatty Acids (FISH OIL) 1200 MG CAPS Take 1,200 mg by mouth every other day.    Marland Kitchen omeprazole (PRILOSEC) 20 MG capsule Take 20 mg by mouth daily as needed.     . vitamin E 400 UNIT capsule Take 400 Units by mouth every other day.     No current facility-administered medications for this visit.    Allergies as of 09/27/2014  . (No Known Allergies)    Vitals: BP 169/83 mmHg  Pulse 59  Resp 16  Ht 5\' 10"  (1.778 m)  Wt 200 lb (90.719 kg)  BMI 28.70 kg/m2 Last Weight:  Wt Readings from Last 1 Encounters:  09/27/14 200 lb (90.719 kg)   Last Height:   Ht Readings from Last 1 Encounters:  09/27/14 5\' 10"  (1.778 m)     Physical exam: Exam: Gen: NAD, conversant Eyes: anicteric sclerae, moist conjunctivae HENT: Atraumatic, oropharynx clear Neck: Trachea midline; supple,  Lungs: CTA, no wheezing, rales, rhonic                          CV: RRR, no MRG Abdomen: Soft, non-tender;  Extremities: No peripheral edema , neck circumference. 15.75 inches , mild retrognathia/. Skin: Normal temperature, no rash,  Psych:  Appropriate affect, pleasant  Neuro:  MS: AA&Ox3, appropriately interactive, normal affect   Speech: fluent w/o paraphasic error  Memory: good recent and remote recall  CN: PERRL, EOMI no nystagmus, no ptosis, sensation intact to LT V1-V3 bilat,  face symmetric,  no weakness, hearing grossly intact,  palate elevates symmetrically, shoulder shrug 5/5 bilat,  tongue protrudes midline, no fasiculations noted.  Motor: normal bulk and tone Strength:5/5  In all extremities  Coord: rapid alternating and point-to-point (FNF, HTS) movements intact.  Reflexes: symmetrical, bilat downgoing toes  Sens:vibration, fine touch and pin prick  intact in all extremities  Gait: posture, stance, stride and arm-swing normal. Few missteps with tandem. Able to walk on heels and toes. Romberg absent.  Assessment:   After physical and neurologic examination, review of laboratory studies, imaging, neurophysiology testing and pre-existing records, assessment will be reviewed on the problem list. OSA on PAP.   TIA  - recurrence 2 weeks ago, Feb 2016, left tongue and cheek, left toe and forfinger became numb, resolved over 14 days. He was on ASA.   Plan:  Treatment plan and additional workup :  Continue CPAP use. Rv in 12 month.     Visit with Dr Janann Colonel for TIA:   64y/o gentleman presenting for initial evaluation of suspected TIA. Had complete evaluation in the hospital which was overall unremarkable.  Symptoms sound consistent with TIA, though duration is long. Differential would also include possible exacerbation of prior R cerebellar infarct.   -Continue on daily ASA 81mg  and lipitor. No indication to switch to plavix or dual anti-platelet therapy at this time.   Larey Seat MD    [facility] [address] [place], [place] [address]  Phone [phone] Fax [phone]

## 2014-09-28 ENCOUNTER — Encounter: Payer: Self-pay | Admitting: Neurology

## 2014-10-05 ENCOUNTER — Telehealth: Payer: Self-pay | Admitting: *Deleted

## 2014-10-05 NOTE — Telephone Encounter (Signed)
Spoke to [employer].  MRI w/o contrast approved 09/30/14-10/29/14 ([id number]).  Confirmation to be faxed.

## 2014-10-06 ENCOUNTER — Ambulatory Visit (INDEPENDENT_AMBULATORY_CARE_PROVIDER_SITE_OTHER): Payer: BLUE CROSS/BLUE SHIELD

## 2014-10-06 DIAGNOSIS — G4733 Obstructive sleep apnea (adult) (pediatric): Secondary | ICD-10-CM

## 2014-10-06 DIAGNOSIS — G459 Transient cerebral ischemic attack, unspecified: Secondary | ICD-10-CM

## 2014-10-06 DIAGNOSIS — Z9989 Dependence on other enabling machines and devices: Principal | ICD-10-CM

## 2014-10-12 ENCOUNTER — Telehealth: Payer: Self-pay | Admitting: *Deleted

## 2014-10-12 NOTE — Telephone Encounter (Signed)
Spoke to pt and relayed the MRI results.

## 2014-10-12 NOTE — Progress Notes (Signed)
Quick Note:  I called and spoke to pt and relayed that MRI brain no major change. TIA a consideration. Will proceed with TCD and taking 81mg  aspirin. Still has R hand (fore-finger tip numbness). FYI ______

## 2014-10-12 NOTE — Telephone Encounter (Signed)
LMVM to return call for MRI results.  (at his convenience).

## 2014-10-21 ENCOUNTER — Other Ambulatory Visit: Payer: BLUE CROSS/BLUE SHIELD

## 2014-11-03 ENCOUNTER — Ambulatory Visit (INDEPENDENT_AMBULATORY_CARE_PROVIDER_SITE_OTHER): Payer: BLUE CROSS/BLUE SHIELD

## 2014-11-03 DIAGNOSIS — G459 Transient cerebral ischemic attack, unspecified: Secondary | ICD-10-CM | POA: Diagnosis not present

## 2014-11-11 ENCOUNTER — Ambulatory Visit (HOSPITAL_COMMUNITY)
Admission: RE | Admit: 2014-11-11 | Discharge: 2014-11-11 | Disposition: A | Discharge status: Home / Self Care | Payer: BLUE CROSS/BLUE SHIELD | Source: Ambulatory Visit | Attending: Cardiology | Admitting: Cardiology

## 2014-11-11 ENCOUNTER — Encounter (HOSPITAL_COMMUNITY): Payer: Self-pay | Admitting: *Deleted

## 2014-11-11 ENCOUNTER — Telehealth: Payer: Self-pay | Admitting: Neurology

## 2014-11-11 ENCOUNTER — Encounter (HOSPITAL_COMMUNITY)
Admission: RE | Disposition: A | Discharge status: Home / Self Care | Payer: Self-pay | Source: Ambulatory Visit | Attending: Cardiology

## 2014-11-11 DIAGNOSIS — I081 Rheumatic disorders of both mitral and tricuspid valves: Secondary | ICD-10-CM | POA: Insufficient documentation

## 2014-11-11 DIAGNOSIS — Z87891 Personal history of nicotine dependence: Secondary | ICD-10-CM | POA: Insufficient documentation

## 2014-11-11 DIAGNOSIS — K219 Gastro-esophageal reflux disease without esophagitis: Secondary | ICD-10-CM | POA: Diagnosis not present

## 2014-11-11 DIAGNOSIS — G459 Transient cerebral ischemic attack, unspecified: Secondary | ICD-10-CM

## 2014-11-11 DIAGNOSIS — G4733 Obstructive sleep apnea (adult) (pediatric): Secondary | ICD-10-CM | POA: Insufficient documentation

## 2014-11-11 DIAGNOSIS — Q211 Atrial septal defect: Secondary | ICD-10-CM | POA: Insufficient documentation

## 2014-11-11 DIAGNOSIS — E785 Hyperlipidemia, unspecified: Secondary | ICD-10-CM | POA: Diagnosis not present

## 2014-11-11 DIAGNOSIS — Z8673 Personal history of transient ischemic attack (TIA), and cerebral infarction without residual deficits: Secondary | ICD-10-CM | POA: Diagnosis not present

## 2014-11-11 HISTORY — PX: TEE WITHOUT CARDIOVERSION: SHX5443

## 2014-11-11 SURGERY — ECHOCARDIOGRAM, TRANSESOPHAGEAL
Anesthesia: Moderate Sedation

## 2014-11-11 MED ORDER — FENTANYL CITRATE (PF) 100 MCG/2ML IJ SOLN
INTRAMUSCULAR | Status: AC
  Filled 2014-11-11: qty 2

## 2014-11-11 MED ORDER — MIDAZOLAM HCL 10 MG/2ML IJ SOLN
INTRAMUSCULAR | Status: DC | PRN
  Administered 2014-11-11 (×2): 2 mg via INTRAVENOUS

## 2014-11-11 MED ORDER — SODIUM CHLORIDE 0.9 % IV SOLN: INTRAVENOUS | Status: DC

## 2014-11-11 MED ORDER — MIDAZOLAM HCL 5 MG/ML IJ SOLN
INTRAMUSCULAR | Status: AC
  Filled 2014-11-11: qty 2

## 2014-11-11 MED ORDER — METOPROLOL TARTRATE 1 MG/ML IV SOLN
INTRAVENOUS | Status: AC
  Filled 2014-11-11: qty 5

## 2014-11-11 MED ORDER — BUTAMBEN-TETRACAINE-BENZOCAINE 2-2-14 % EX AERO
INHALATION_SPRAY | CUTANEOUS | Status: DC | PRN
  Administered 2014-11-11: 2 via TOPICAL

## 2014-11-11 MED ORDER — FENTANYL CITRATE (PF) 100 MCG/2ML IJ SOLN
INTRAMUSCULAR | Status: DC | PRN
  Administered 2014-11-11: 25 ug via INTRAVENOUS
  Administered 2014-11-11: 50 ug via INTRAVENOUS

## 2014-11-11 MED ORDER — HYDRALAZINE HCL 20 MG/ML IJ SOLN
INTRAMUSCULAR | Status: DC | PRN
  Administered 2014-11-11: 10 mg via INTRAVENOUS

## 2014-11-11 MED ORDER — METOPROLOL TARTRATE 1 MG/ML IV SOLN
INTRAVENOUS | Status: DC | PRN
  Administered 2014-11-11: 5 mg via INTRAVENOUS

## 2014-11-11 MED ORDER — HYDRALAZINE HCL 20 MG/ML IJ SOLN
INTRAMUSCULAR | Status: AC
  Filled 2014-11-11: qty 1

## 2014-11-11 NOTE — Discharge Instructions (Signed)
Transesophageal Echocardiogram °Transesophageal echocardiography (TEE) is a special type of test that produces images of the heart by using sound waves (echocardiogram). This type of echocardiography can obtain better images of the heart than standard echocardiography. TEE is done by passing a flexible tube down the esophagus. The heart is located in front of the esophagus. Because the heart and esophagus are close to one another, your health care provider can take very clear, detailed pictures of the heart via ultrasound waves. °TEE may be done: °· If your health care provider needs more information based on standard echocardiography findings. °· If you had a stroke. This might have happened because a clot formed in your heart. TEE can visualize different areas of the heart and check for clots. °· To check valve anatomy and function. °· To check for infection on the inside of your heart (endocarditis). °· To evaluate the dividing wall (septum) of the heart and presence of a hole that did not close after birth (patent foramen ovale or atrial septal defect). °· To help diagnose a tear in the wall of the aorta (aortic dissection). °· During cardiac valve surgery. This allows the surgeon to assess the valve repair before closing the chest. °· During a variety of other cardiac procedures to guide positioning of catheters. °· Sometimes before a cardioversion, which is a shock to convert heart rhythm back to normal. °LET YOUR HEALTH CARE PROVIDER KNOW ABOUT:  °· Any allergies you have. °· All medicines you are taking, including vitamins, herbs, eye drops, creams, and over-the-counter medicines. °· Previous problems you or members of your family have had with the use of anesthetics. °· Any blood disorders you have. °· Previous surgeries you have had. °· Medical conditions you have. °· Swallowing difficulties. °· An esophageal obstruction. °RISKS AND COMPLICATIONS  °Generally, TEE is a safe procedure. However, as with any  procedure, complications can occur. Possible complications include an esophageal tear (rupture). °BEFORE THE PROCEDURE  °· Do not eat or drink for 6 hours before the procedure or as directed by your health care provider. °· Arrange for someone to drive you home after the procedure. Do not drive yourself home. During the procedure, you will be given medicines that can continue to make you feel drowsy and can impair your reflexes. °· An IV access tube will be started in the arm. °PROCEDURE  °· A medicine to help you relax (sedative) will be given through the IV access tube. °· A medicine may be sprayed or gargled to numb the back of the throat. °· Your blood pressure, heart rate, and breathing (vital signs) will be monitored during the procedure. °· The TEE probe is a long, flexible tube. The tip of the probe is placed into the back of the mouth, and you will be asked to swallow. This helps to pass the tip of the probe into the esophagus. Once the tip of the probe is in the correct area, your health care provider can take pictures of the heart. °· TEE is usually not a painful procedure. You may feel the probe press against the back of the throat. The probe does not enter the trachea and does not affect your breathing. °AFTER THE PROCEDURE  °· You will be in bed, resting, until you have fully returned to consciousness. °· When you first awaken, your throat may feel slightly sore and will probably still feel numb. This will improve slowly over time. °· You will not be allowed to eat or drink until it   is clear that the numbness has improved. °· Once you have been able to drink, urinate, and sit on the edge of the bed without feeling sick to your stomach (nausea) or dizzy, you may be cleared to go home. °· You should have a friend or family member with you for the next 24 hours after your procedure. °Document Released: 09/29/2002 Document Revised: 07/14/2013 Document Reviewed: 01/08/2013 °Exit[employer]® Patient Information  ©2015 [employer]. This information is not intended to replace advice given to you by your health care provider. Make sure you discuss any questions you have with your health care provider. ° °

## 2014-11-11 NOTE — Interval H&P Note (Signed)
History and Physical Interval Note:  11/11/2014 8:16 AM  Caleb Rogers  has presented today for surgery, with the diagnosis of TIA  The various methods of treatment have been discussed with the patient and family. After consideration of risks, benefits and other options for treatment, the patient has consented to  Procedure(s) with comments: TRANSESOPHAGEAL ECHOCARDIOGRAM (TEE) (N/A) - h/p in file drawer as a surgical intervention .  The patient's history has been reviewed, patient examined, no change in status, stable for surgery.  I have reviewed the patient's chart and labs.  Questions were answered to the patient's satisfaction.     Laverda Page

## 2014-11-11 NOTE — CV Procedure (Signed)
Mild mixed plaque in the descending thoracic aorta. No complex plaque. Ascending aorta normal. Normal LVEF. Trace MR and TR. PFO with positive double contrast study for right to left shunting with Valsalva.

## 2014-11-11 NOTE — H&P (Signed)
  Please see office visit notes for complete details of HPI.  

## 2014-11-11 NOTE — Progress Notes (Signed)
  Echocardiogram Echocardiogram Transesophageal has been performed.  Caleb Rogers 11/11/2014, 12:27 PM

## 2014-11-11 NOTE — Telephone Encounter (Signed)
I called patient. The transcranial Doppler bubble study/emboli monitoring study showed evidence of spontaneous cerebral emboli. Emboli are seen bilaterally. The patient has had a TEE study yesterday. The patient is on low-dose aspirin, he will continue this therapy. I discussed the results with the patient.

## 2014-11-12 ENCOUNTER — Encounter (HOSPITAL_COMMUNITY): Payer: Self-pay | Admitting: Cardiology

## 2015-02-25 ENCOUNTER — Ambulatory Visit: Payer: BC Managed Care – PPO | Admitting: Neurology

## 2015-07-05 ENCOUNTER — Telehealth: Payer: Self-pay | Admitting: Neurology

## 2015-07-05 NOTE — Telephone Encounter (Signed)
Spoke to pt. He just turned 29 and is a new medicare patient. He was using [facility] for his cpap and supplies but according to the pt, they went out of business. I advised pt that I would reach out to [facility] and [facility] and find out if they are able to take him on as a patient. Pt verbalized understanding.  I reached out to [facility] and am waiting for their response.

## 2015-07-05 NOTE — Telephone Encounter (Signed)
Patient's wife called to get a suggestion for a new DME company.  The one they were referred to earlier has gone out of business.  Please call patient with this information.

## 2015-07-05 NOTE — Telephone Encounter (Signed)
[facility] was contacted and they agreed to take pt on. [facility] has already called pt to make an appt with them.  I called pt to inform him of this and gave him [facility]'s number. Pt verbalized understanding and appreciation.

## 2015-07-13 DIAGNOSIS — G4733 Obstructive sleep apnea (adult) (pediatric): Secondary | ICD-10-CM | POA: Diagnosis not present

## 2015-08-22 DIAGNOSIS — E7800 Pure hypercholesterolemia, unspecified: Secondary | ICD-10-CM | POA: Diagnosis not present

## 2015-08-22 DIAGNOSIS — I10 Essential (primary) hypertension: Secondary | ICD-10-CM | POA: Diagnosis not present

## 2015-08-22 DIAGNOSIS — Z23 Encounter for immunization: Secondary | ICD-10-CM | POA: Diagnosis not present

## 2015-08-22 DIAGNOSIS — K219 Gastro-esophageal reflux disease without esophagitis: Secondary | ICD-10-CM | POA: Diagnosis not present

## 2015-09-28 ENCOUNTER — Ambulatory Visit: Payer: BLUE CROSS/BLUE SHIELD | Admitting: Neurology

## 2015-10-04 ENCOUNTER — Ambulatory Visit (INDEPENDENT_AMBULATORY_CARE_PROVIDER_SITE_OTHER): Payer: Medicare HMO | Admitting: Nurse Practitioner

## 2015-10-04 ENCOUNTER — Encounter: Payer: Self-pay | Admitting: Nurse Practitioner

## 2015-10-04 VITALS — BP 146/77 | HR 47 | Ht 70.0 in | Wt 188.8 lb

## 2015-10-04 DIAGNOSIS — G459 Transient cerebral ischemic attack, unspecified: Secondary | ICD-10-CM | POA: Diagnosis not present

## 2015-10-04 DIAGNOSIS — G4733 Obstructive sleep apnea (adult) (pediatric): Secondary | ICD-10-CM | POA: Diagnosis not present

## 2015-10-04 DIAGNOSIS — Z9989 Dependence on other enabling machines and devices: Secondary | ICD-10-CM

## 2015-10-04 NOTE — Progress Notes (Signed)
[facility]  PATIENT: Caleb Rogers DOB: 20-Nov-1950   REASON FOR VISIT: Follow-up for obstructive sleep apnea on CPAP, history of TIA/stroke HISTORY FROM: Patient    HISTORY OF PRESENT ILLNESS: HISTORY:Last visit note : CD 09/27/14 Woke up few weeeks ago, noticed difficulty using right leg, felt uncoordinated, presented to the ED. Symptoms quickly improved but baseline gait instability remained for around 1 to 2 days The few days prior to this event had flu like symptoms. MRI in the ED showed no acute process but question of old R cerebellar infarct.  Patient feels he is currently back to baseline.  Notes no prior known history of stroke or TIA. Exercises on a regular basis. Has history of HLD, drinks 4 glasses of wine daily. Does not smoke. In office download. The patient improved his compliance to 5 hours and 44 minutes at night has a residual AHI of 0.9 AHI at a CPAP pressure of 7 cm water his compliance was 100%. He reports sleeping sounder , and his wife feels strongly he is less restless. His machine is quiet works and is unobtrusive.  2 days prior to his TIA he drank heavier and wanted to know if this was his risk. He is regularly dancing Zumba and has been working outdoors. He may have gotten dehydrated.   Per hospital notes: Bennard Zafar is an 64 y.o. male who admits to drinking 4 glasses of wine daily but does not smoke. Patient went to sleep last night feeling well. He awoke this am and noted he was listing to the right when he was walking. He denies any inner ear infection, fullness, ringing, vertigo, diplopia, dysphagia. He also denies any decreased sensation or weakness. He states he has never had symptoms like this prior.  MRI and A brain were overall unremarkable, showed remote R cerebellar infarct  UPDATE 10/04/2015 CMMr. Sasse, 65 year old male returns for follow-up. He was last seen in the office 09/27/2014. He has a history of remote  stroke as well as obstructive sleep apnea with CPAP. His compliance has been poor at 36% over 3 month period of time. He does say he slept with his CPAP last night and is much less fatigue. He is on aspirin and Lipitor for secondary stroke prevention. He continues to exercise. Blood pressure 146/77 in the office today and lisinopril has recently been added to his regimen. He returns for reevaluation  REVIEW OF SYSTEMS: Full 14 system review of systems performed and notable only for those listed, all others are neg:  Constitutional: neg  Cardiovascular: neg Ear/Nose/Throat: neg  Skin: neg Eyes: neg Respiratory: neg Gastroitestinal: neg  Hematology/Lymphatic: neg  Endocrine: neg Musculoskeletal:neg Allergy/Immunology: neg Neurological: neg Psychiatric: neg Sleep : neg   ALLERGIES: No Known Allergies  HOME MEDICATIONS: Outpatient Prescriptions Prior to Visit  Medication Sig Dispense Refill  . Acetylcarnitine HCl (ACETYL L-CARNITINE) 500 MG CAPS Take 500 mg by mouth every other day.    Ilean Skill Lipoic Acid 200 MG CAPS Take 200 mg by mouth every other day.    Marland Kitchen aspirin EC 81 MG EC tablet Take 1 tablet (81 mg total) by mouth daily.    Marland Kitchen atorvastatin (LIPITOR) 40 MG tablet Take 40 mg by mouth daily.     . B Complex-C (SUPER B COMPLEX PO) Take 0.5 tablets by mouth every other day.    . Cholecalciferol (VITAMIN D3) 2000 UNITS TABS Take 2,000 Units by mouth every other day.    . Magnesium 500 MG CAPS Take  500 mg by mouth every other day.    . Multiple Vitamin (MULTIVITAMIN WITH MINERALS) TABS tablet Take 0.5 tablets by mouth every other day.    . Omega-3 Fatty Acids (FISH OIL) 1200 MG CAPS Take 1,200 mg by mouth every other day.    Marland Kitchen omeprazole (PRILOSEC) 20 MG capsule Take 20 mg by mouth daily as needed.     . vitamin E 400 UNIT capsule Take 400 Units by mouth every other day.     No facility-administered medications prior to visit.    PAST MEDICAL HISTORY: Past Medical History    Diagnosis Date  . GERD (gastroesophageal reflux disease)   . Impaired fasting glucose   . Pure hypercholesterolemia   . Sleep apnea   . Gait instability 08/2013  . OSA (obstructive sleep apnea) 02/24/2014    PAST SURGICAL HISTORY: Past Surgical History  Procedure Laterality Date  . None    . Colonoscopy    . Tee without cardioversion N/A 11/11/2014    Procedure: TRANSESOPHAGEAL ECHOCARDIOGRAM (TEE);  Surgeon: Adrian Prows, MD;  Location: [facility] ENDOSCOPY;  Service: Cardiovascular;  Laterality: N/A;  h/p in file drawer    FAMILY HISTORY: Family History  Problem Relation Age of Onset  . Cancer Mother   . Congestive Heart Failure Mother   . Coronary artery disease Father   . Coronary artery disease Brother 71    SOCIAL HISTORY: Social History   Social History  . Marital Status: Married    Spouse Name: Vicente Males  . Number of Children: 2  . Years of Education: college   Occupational History  . 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 Smoking status: Former Smoker    Quit date: 07/23/1968  . Smokeless tobacco: Never Used     Comment: pt quit July 1997  . Alcohol Use: 1.8 oz/week    3 Glasses of wine per week     Comment: SOCIAL  . Drug Use: No  . Sexual Activity:    Partners: Female   Other Topics Concern  . Not on file   Social History Narrative   Patient is married Vicente Males) and lives at home with his wife.   Patient has two adult children.   Patient is working full-time.   Patient has a college education.   Patient is right- handed.   Patient drinks two cups of coffee daily.     PHYSICAL EXAM  Filed Vitals:   10/04/15 1245  BP: 146/77  Pulse: 47  Height: 5\' 10"  (1.778 m)  Weight: 188 lb 12.8 oz (85.639 kg)   Body mass index is 27.09 kg/(m^2).  Generalized: Well developed, in no acute distress  Head: normocephalic and atraumatic,. Oropharynx benign  Neck: Supple, no carotid bruits  Cardiac: Regular rate rhythm, no murmur   Musculoskeletal: No deformity   Neurological examination   Mentation: Alert oriented to time, place, history taking. Attention span and concentration appropriate. Recent and remote memory intact.  Follows all commands speech and language fluent. ESS 9. FSS 18.  Cranial nerve II-XII: Fundoscopic exam reveals sharp disc margins.Pupils were equal round reactive to light extraocular movements were full, visual field were full on confrontational test. Facial sensation and strength were normal. hearing was intact to finger rubbing bilaterally. Uvula tongue midline. head turning and shoulder shrug were normal and symmetric.Tongue protrusion into cheek strength was normal. Motor: normal bulk and tone, full strength in the BUE, BLE, fine finger movements normal, no pronator drift. No  focal weakness Sensory: normal and symmetric to light touch, pinprick, and  Vibration, proprioception  Coordination: finger-nose-finger, heel-to-shin bilaterally, no dysmetria Reflexes: Brachioradialis 2/2, biceps 2/2, triceps 2/2, patellar 2/2, Achilles 2/2, plantar responses were flexor bilaterally. Gait and Station: Rising up from seated position without assistance, normal stance,  moderate stride, good arm swing, smooth turning, able to perform tiptoe, and heel walking without difficulty. Tandem gait is steady  DIAGNOSTIC DATA (LABS, IMAGING, TESTING) -  ASSESSMENT AND PLAN  65 y.o. year old male  has a past medical history of  Pure hypercholesterolemia;  and OSA (obstructive sleep apnea) (02/24/2014). history of stroke TIA February 2016.  Please use CPAP every night only 36% compliance in 3 month period of time  Made aware obstructive sleep apnea is a risk factor for stroke Continue aspirin for secondary stroke prevention Continue Lipitor for cholesterol Continue exercise program Follow-up in 3 months for CPAP compliance Dennie Bible, [facility], [facility], APRN  [facility] [address], [place], [place] [address] [phone]

## 2015-10-04 NOTE — Patient Instructions (Signed)
Please use CPAP every night  obstructive sleep apnea is a risk factor for stroke Continue aspirin for secondary stroke prevention Continue Lipitor for cholesterol Continue exercise program Follow-up in 3 months for CPAP compliance

## 2015-10-04 NOTE — Progress Notes (Signed)
I agree with the assessment and plan as directed by NP .The patient is known to me .   Jacquelene Kopecky, MD  

## 2015-10-26 DIAGNOSIS — R011 Cardiac murmur, unspecified: Secondary | ICD-10-CM | POA: Diagnosis not present

## 2015-10-26 DIAGNOSIS — I10 Essential (primary) hypertension: Secondary | ICD-10-CM | POA: Diagnosis not present

## 2015-10-26 DIAGNOSIS — I638 Other cerebral infarction: Secondary | ICD-10-CM | POA: Diagnosis not present

## 2015-10-26 DIAGNOSIS — G4733 Obstructive sleep apnea (adult) (pediatric): Secondary | ICD-10-CM | POA: Diagnosis not present

## 2015-10-26 DIAGNOSIS — Z1389 Encounter for screening for other disorder: Secondary | ICD-10-CM | POA: Diagnosis not present

## 2015-10-26 DIAGNOSIS — Z6827 Body mass index (BMI) 27.0-27.9, adult: Secondary | ICD-10-CM | POA: Diagnosis not present

## 2015-10-26 DIAGNOSIS — Z0000 Encounter for general adult medical examination without abnormal findings: Secondary | ICD-10-CM | POA: Diagnosis not present

## 2015-10-26 DIAGNOSIS — E784 Other hyperlipidemia: Secondary | ICD-10-CM | POA: Diagnosis not present

## 2015-10-26 DIAGNOSIS — R69 Illness, unspecified: Secondary | ICD-10-CM | POA: Diagnosis not present

## 2015-10-26 DIAGNOSIS — D7589 Other specified diseases of blood and blood-forming organs: Secondary | ICD-10-CM | POA: Diagnosis not present

## 2015-11-23 DIAGNOSIS — B079 Viral wart, unspecified: Secondary | ICD-10-CM | POA: Diagnosis not present

## 2015-11-23 DIAGNOSIS — D1801 Hemangioma of skin and subcutaneous tissue: Secondary | ICD-10-CM | POA: Diagnosis not present

## 2015-11-23 DIAGNOSIS — L821 Other seborrheic keratosis: Secondary | ICD-10-CM | POA: Diagnosis not present

## 2016-01-04 ENCOUNTER — Encounter: Payer: Self-pay | Admitting: Nurse Practitioner

## 2016-01-04 ENCOUNTER — Ambulatory Visit (INDEPENDENT_AMBULATORY_CARE_PROVIDER_SITE_OTHER): Payer: Medicare HMO | Admitting: Nurse Practitioner

## 2016-01-04 VITALS — BP 159/75 | HR 53 | Ht 70.0 in | Wt 182.8 lb

## 2016-01-04 DIAGNOSIS — G459 Transient cerebral ischemic attack, unspecified: Secondary | ICD-10-CM

## 2016-01-04 DIAGNOSIS — I10 Essential (primary) hypertension: Secondary | ICD-10-CM

## 2016-01-04 DIAGNOSIS — E785 Hyperlipidemia, unspecified: Secondary | ICD-10-CM | POA: Diagnosis not present

## 2016-01-04 DIAGNOSIS — G4733 Obstructive sleep apnea (adult) (pediatric): Secondary | ICD-10-CM | POA: Diagnosis not present

## 2016-01-04 DIAGNOSIS — Z9989 Dependence on other enabling machines and devices: Secondary | ICD-10-CM

## 2016-01-04 NOTE — Patient Instructions (Signed)
Please use CPAP every night 83% compliance in last month Made aware obstructive sleep apnea is a risk factor for stroke Continue aspirin for secondary stroke prevention Continue Lipitor for cholesterol Continue exercise program Follow-up in 1 year. Next with dr. Brett Fairy

## 2016-01-04 NOTE — Progress Notes (Signed)
[facility]  PATIENT: Devvon Arnwine DOB: 1950-11-09   REASON FOR VISIT: Follow-up for obstructive sleep apnea on CPAP, history of TIA stroke HISTORY FROM: Patient    HISTORY OF PRESENT ILLNESS:UPDATE 6/14/17CM Mr. Dewberry, 65 year old male returns for follow-up. He has remote history of stroke as well as obstructive sleep apnea with CPAP. His compliance was poor at 36% at his last visit. He was asked to follow up in 3 months compliance today's 83% for 25 days and 5 days he did not use CPAP average usage 4 hours 5 minutes CPAP pressure 7 cm AHI 0.2. He remains on aspirin and Lipitor for secondary stroke prevention. 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 He returns for reevaluation   UPDATE 10/04/2015 CMMr. Stellhorn, 65 year old male returns for follow-up. He was last seen in the office 09/27/2014. He has a history of remote stroke as well as obstructive sleep apnea with CPAP. His compliance has been poor at 36% over 3 month period of time. He does say he slept with his CPAP last night and is much less fatigue. He is on aspirin and Lipitor for secondary stroke prevention. He continues to exercise. Blood pressure 146/77 in the office today and lisinopril has recently been added to his regimen. He returns for reevaluation   HISTORY:Last visit note : CD 09/27/14 Woke up few weeeks ago, noticed difficulty using right leg, felt uncoordinated, presented to the ED. Symptoms quickly improved but baseline gait instability remained for around 1 to 2 days The few days prior to this event had flu like symptoms. MRI in the ED showed no acute process but question of old R cerebellar infarct.  Patient feels he is currently back to baseline.  Notes no prior known history of stroke or TIA. Exercises on a regular basis. Has history of HLD, drinks 4 glasses of wine daily. Does not smoke. In office download. The patient improved his compliance to 5 hours and 44 minutes  at night has a residual AHI of 0.9 AHI at a CPAP pressure of 7 cm water his compliance was 100%. He reports sleeping sounder , and his wife feels strongly he is less restless. His machine is quiet works and is unobtrusive.  2 days prior to his TIA he drank heavier and wanted to know if this was his risk. He is regularly dancing Zumba and has been working outdoors. He may have gotten dehydrated.   Per hospital notes: Caellum Stork is an 65 y.o. male who admits to drinking 4 glasses of wine daily but does not smoke. Patient went to sleep last night feeling well. He awoke this am and noted he was listing to the right when he was walking. He denies any inner ear infection, fullness, ringing, vertigo, diplopia, dysphagia. He also denies any decreased sensation or weakness. He states he has never had symptoms like this prior.  MRI and A brain were overall unremarkable, showed remote R cerebellar infarct  REVIEW OF SYSTEMS: Full 14 system review of systems performed and notable only for those listed, all others are neg:  Constitutional: neg  Cardiovascular: neg Ear/Nose/Throat: neg  Skin: neg Eyes: neg Respiratory: neg Gastroitestinal: neg  Hematology/Lymphatic: neg  Endocrine: neg Musculoskeletal:neg Allergy/Immunology: neg Neurological: neg Psychiatric: neg Sleep : neg   ALLERGIES: No Known Allergies  HOME MEDICATIONS: Outpatient Prescriptions Prior to Visit  Medication Sig Dispense Refill  . Acetylcarnitine HCl (ACETYL L-CARNITINE) 500 MG CAPS Take 500 mg by mouth every other day.    Marland Kitchen  Alpha Lipoic Acid 200 MG CAPS Take 200 mg by mouth every other day.    Marland Kitchen aspirin EC 81 MG EC tablet Take 1 tablet (81 mg total) by mouth daily.    Marland Kitchen atorvastatin (LIPITOR) 40 MG tablet Take 40 mg by mouth daily.     . B Complex-C (SUPER B COMPLEX PO) Take 0.5 tablets by mouth every other day.    . Cholecalciferol (VITAMIN D3) 2000 UNITS TABS Take 2,000 Units by mouth every other day.    .  lisinopril-hydrochlorothiazide (PRINZIDE,ZESTORETIC) 20-12.5 MG tablet TK 1 T PO QAM  2  . Magnesium 500 MG CAPS Take 500 mg by mouth every other day.    . Multiple Vitamin (MULTIVITAMIN WITH MINERALS) TABS tablet Take 0.5 tablets by mouth every other day.    . Omega-3 Fatty Acids (FISH OIL) 1200 MG CAPS Take 1,200 mg by mouth every other day.    Marland Kitchen omeprazole (PRILOSEC) 20 MG capsule Take 20 mg by mouth daily as needed.     . vitamin E 400 UNIT capsule Take 400 Units by mouth every other day.     No facility-administered medications prior to visit.    PAST MEDICAL HISTORY: Past Medical History  Diagnosis Date  . GERD (gastroesophageal reflux disease)   . Impaired fasting glucose   . Pure hypercholesterolemia   . Sleep apnea   . Gait instability 08/2013  . OSA (obstructive sleep apnea) 02/24/2014    PAST SURGICAL HISTORY: Past Surgical History  Procedure Laterality Date  . None    . Colonoscopy    . Tee without cardioversion N/A 11/11/2014    Procedure: TRANSESOPHAGEAL ECHOCARDIOGRAM (TEE);  Surgeon: Adrian Prows, MD;  Location: [facility] ENDOSCOPY;  Service: Cardiovascular;  Laterality: N/A;  h/p in file drawer    FAMILY HISTORY: Family History  Problem Relation Age of Onset  . Cancer Mother   . Congestive Heart Failure Mother   . Coronary artery disease Father   . Coronary artery disease Brother 32    SOCIAL HISTORY: Social History   Social History  . Marital Status: Married    Spouse Name: Vicente Males  . Number of Children: 2  . Years of Education: college   Occupational History  . 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 Smoking status: Former Smoker    Quit date: 07/23/1968  . Smokeless tobacco: Never Used     Comment: pt quit July 1997  . Alcohol Use: 1.8 oz/week    3 Glasses of wine per week     Comment: SOCIAL  . Drug Use: No  . Sexual Activity:    Partners: Female   Other Topics Concern  . Not on file   Social History Narrative    Patient is married Vicente Males) and lives at home with his wife.   Patient has two adult children.   Patient is working full-time.   Patient has a college education.   Patient is right- handed.   Patient drinks two cups of coffee daily.     PHYSICAL EXAM  Filed Vitals:   01/04/16 1307  BP: 159/75  Pulse: 53  Height: 5\' 10"  (1.778 m)  Weight: 182 lb 12.8 oz (82.918 kg)   Body mass index is 26.23 kg/(m^2). Generalized: Well developed, in no acute distress  Head: normocephalic and atraumatic,. Oropharynx benign  Neck: Supple, no carotid bruits  Cardiac: Regular rate rhythm, no murmur  Musculoskeletal: No deformity   Neurological examination   Mentation:  Alert oriented to time, place, history taking. Attention span and concentration appropriate. Recent and remote memory intact. Follows all commands speech and language fluent. ESS 7. FSS 18.  Cranial nerve II-XII: Fundoscopic exam reveals sharp disc margins.Pupils were equal round reactive to light extraocular movements were full, visual field were full on confrontational test. Facial sensation and strength were normal. hearing was intact to finger rubbing bilaterally. Uvula tongue midline. head turning and shoulder shrug were normal and symmetric.Tongue protrusion into cheek strength was normal. Motor: normal bulk and tone, full strength in the BUE, BLE, fine finger movements normal, no pronator drift. No focal weakness Sensory: normal and symmetric to light touch, pinprick, and Vibration, proprioception  Coordination: finger-nose-finger, heel-to-shin bilaterally, no dysmetria Reflexes: Brachioradialis 2/2, biceps 2/2, triceps 2/2, patellar 2/2, Achilles 2/2, plantar responses were flexor bilaterally. Gait and Station: Rising up from seated position without assistance, normal stance, moderate stride, good arm swing, smooth turning, able to perform tiptoe, and heel walking without difficulty. Tandem gait is steady  DIAGNOSTIC DATA  (LABS, IMAGING, TESTING) -  ASSESSMENT AND PLAN 65 y.o. year old male has a past medical history of Pure hypercholesterolemia; and OSA (obstructive sleep apnea) (02/24/2014). history of stroke TIA February 2016.  Please use CPAP every night 83% compliance in last month  for 25 days and 5 days he did not use CPAP average usage 4 hours 5 minutes CPAP pressure 7 cm AHI 0.2.  Made aware obstructive sleep apnea is a risk factor for stroke Continue aspirin for secondary stroke prevention Continue Lipitor for cholesterol Continue exercise program Follow-up in 1 year. Next with Dr. De Nurse, [facility], [facility], APRN  [facility] [address] [place], [place] [address] [phone]

## 2016-01-04 NOTE — Progress Notes (Signed)
I agree with the assessment and plan as directed by NP .The patient is known to me .   Mendell Bontempo, MD  

## 2016-05-01 DIAGNOSIS — Z1389 Encounter for screening for other disorder: Secondary | ICD-10-CM | POA: Diagnosis not present

## 2016-05-01 DIAGNOSIS — M25561 Pain in right knee: Secondary | ICD-10-CM | POA: Diagnosis not present

## 2016-05-01 DIAGNOSIS — Z6825 Body mass index (BMI) 25.0-25.9, adult: Secondary | ICD-10-CM | POA: Diagnosis not present

## 2016-05-01 DIAGNOSIS — Z23 Encounter for immunization: Secondary | ICD-10-CM | POA: Diagnosis not present

## 2016-05-01 DIAGNOSIS — R05 Cough: Secondary | ICD-10-CM | POA: Diagnosis not present

## 2016-10-09 DIAGNOSIS — M9901 Segmental and somatic dysfunction of cervical region: Secondary | ICD-10-CM | POA: Diagnosis not present

## 2016-10-09 DIAGNOSIS — M7541 Impingement syndrome of right shoulder: Secondary | ICD-10-CM | POA: Diagnosis not present

## 2016-10-09 DIAGNOSIS — M9902 Segmental and somatic dysfunction of thoracic region: Secondary | ICD-10-CM | POA: Diagnosis not present

## 2016-10-09 DIAGNOSIS — M9907 Segmental and somatic dysfunction of upper extremity: Secondary | ICD-10-CM | POA: Diagnosis not present

## 2016-10-16 DIAGNOSIS — M9902 Segmental and somatic dysfunction of thoracic region: Secondary | ICD-10-CM | POA: Diagnosis not present

## 2016-10-16 DIAGNOSIS — M7541 Impingement syndrome of right shoulder: Secondary | ICD-10-CM | POA: Diagnosis not present

## 2016-10-16 DIAGNOSIS — M9901 Segmental and somatic dysfunction of cervical region: Secondary | ICD-10-CM | POA: Diagnosis not present

## 2016-10-16 DIAGNOSIS — M9907 Segmental and somatic dysfunction of upper extremity: Secondary | ICD-10-CM | POA: Diagnosis not present

## 2016-10-23 DIAGNOSIS — I10 Essential (primary) hypertension: Secondary | ICD-10-CM | POA: Diagnosis not present

## 2016-10-23 DIAGNOSIS — M9901 Segmental and somatic dysfunction of cervical region: Secondary | ICD-10-CM | POA: Diagnosis not present

## 2016-10-23 DIAGNOSIS — M9907 Segmental and somatic dysfunction of upper extremity: Secondary | ICD-10-CM | POA: Diagnosis not present

## 2016-10-23 DIAGNOSIS — M9902 Segmental and somatic dysfunction of thoracic region: Secondary | ICD-10-CM | POA: Diagnosis not present

## 2016-10-23 DIAGNOSIS — M7541 Impingement syndrome of right shoulder: Secondary | ICD-10-CM | POA: Diagnosis not present

## 2016-10-23 DIAGNOSIS — E784 Other hyperlipidemia: Secondary | ICD-10-CM | POA: Diagnosis not present

## 2016-10-23 DIAGNOSIS — Z125 Encounter for screening for malignant neoplasm of prostate: Secondary | ICD-10-CM | POA: Diagnosis not present

## 2016-10-30 DIAGNOSIS — E298 Other testicular dysfunction: Secondary | ICD-10-CM | POA: Diagnosis not present

## 2016-10-30 DIAGNOSIS — I638 Other cerebral infarction: Secondary | ICD-10-CM | POA: Diagnosis not present

## 2016-10-30 DIAGNOSIS — R69 Illness, unspecified: Secondary | ICD-10-CM | POA: Diagnosis not present

## 2016-10-30 DIAGNOSIS — Z23 Encounter for immunization: Secondary | ICD-10-CM | POA: Diagnosis not present

## 2016-10-30 DIAGNOSIS — E784 Other hyperlipidemia: Secondary | ICD-10-CM | POA: Diagnosis not present

## 2016-10-30 DIAGNOSIS — Z0000 Encounter for general adult medical examination without abnormal findings: Secondary | ICD-10-CM | POA: Diagnosis not present

## 2016-10-30 DIAGNOSIS — G4733 Obstructive sleep apnea (adult) (pediatric): Secondary | ICD-10-CM | POA: Diagnosis not present

## 2016-10-30 DIAGNOSIS — E291 Testicular hypofunction: Secondary | ICD-10-CM | POA: Diagnosis not present

## 2016-10-30 DIAGNOSIS — D7589 Other specified diseases of blood and blood-forming organs: Secondary | ICD-10-CM | POA: Diagnosis not present

## 2016-10-30 DIAGNOSIS — R05 Cough: Secondary | ICD-10-CM | POA: Diagnosis not present

## 2016-10-30 DIAGNOSIS — I10 Essential (primary) hypertension: Secondary | ICD-10-CM | POA: Diagnosis not present

## 2016-11-01 DIAGNOSIS — Z1212 Encounter for screening for malignant neoplasm of rectum: Secondary | ICD-10-CM | POA: Diagnosis not present

## 2016-11-21 DIAGNOSIS — M25511 Pain in right shoulder: Secondary | ICD-10-CM | POA: Diagnosis not present

## 2016-11-21 DIAGNOSIS — L738 Other specified follicular disorders: Secondary | ICD-10-CM | POA: Diagnosis not present

## 2016-11-21 DIAGNOSIS — D1801 Hemangioma of skin and subcutaneous tissue: Secondary | ICD-10-CM | POA: Diagnosis not present

## 2016-11-21 DIAGNOSIS — L821 Other seborrheic keratosis: Secondary | ICD-10-CM | POA: Diagnosis not present

## 2016-11-21 DIAGNOSIS — G8929 Other chronic pain: Secondary | ICD-10-CM | POA: Diagnosis not present

## 2016-11-21 DIAGNOSIS — L570 Actinic keratosis: Secondary | ICD-10-CM | POA: Diagnosis not present

## 2016-11-21 DIAGNOSIS — D225 Melanocytic nevi of trunk: Secondary | ICD-10-CM | POA: Diagnosis not present

## 2016-11-27 DIAGNOSIS — M859 Disorder of bone density and structure, unspecified: Secondary | ICD-10-CM | POA: Diagnosis not present

## 2017-01-08 ENCOUNTER — Ambulatory Visit: Payer: Medicare HMO | Admitting: Neurology

## 2017-02-06 ENCOUNTER — Ambulatory Visit: Payer: Medicare HMO | Admitting: Neurology

## 2017-02-07 ENCOUNTER — Encounter: Payer: Self-pay | Admitting: Neurology

## 2017-05-12 DIAGNOSIS — Z23 Encounter for immunization: Secondary | ICD-10-CM | POA: Diagnosis not present

## 2017-10-30 DIAGNOSIS — M9902 Segmental and somatic dysfunction of thoracic region: Secondary | ICD-10-CM | POA: Diagnosis not present

## 2017-10-30 DIAGNOSIS — M546 Pain in thoracic spine: Secondary | ICD-10-CM | POA: Diagnosis not present

## 2017-10-30 DIAGNOSIS — M542 Cervicalgia: Secondary | ICD-10-CM | POA: Diagnosis not present

## 2017-10-30 DIAGNOSIS — M9901 Segmental and somatic dysfunction of cervical region: Secondary | ICD-10-CM | POA: Diagnosis not present

## 2017-11-01 DIAGNOSIS — M25532 Pain in left wrist: Secondary | ICD-10-CM | POA: Diagnosis not present

## 2017-11-01 DIAGNOSIS — M9901 Segmental and somatic dysfunction of cervical region: Secondary | ICD-10-CM | POA: Diagnosis not present

## 2017-11-01 DIAGNOSIS — M9902 Segmental and somatic dysfunction of thoracic region: Secondary | ICD-10-CM | POA: Diagnosis not present

## 2017-11-01 DIAGNOSIS — M542 Cervicalgia: Secondary | ICD-10-CM | POA: Diagnosis not present

## 2017-11-04 DIAGNOSIS — M9902 Segmental and somatic dysfunction of thoracic region: Secondary | ICD-10-CM | POA: Diagnosis not present

## 2017-11-04 DIAGNOSIS — M25532 Pain in left wrist: Secondary | ICD-10-CM | POA: Diagnosis not present

## 2017-11-04 DIAGNOSIS — M9901 Segmental and somatic dysfunction of cervical region: Secondary | ICD-10-CM | POA: Diagnosis not present

## 2017-11-04 DIAGNOSIS — M542 Cervicalgia: Secondary | ICD-10-CM | POA: Diagnosis not present

## 2017-11-11 DIAGNOSIS — M9902 Segmental and somatic dysfunction of thoracic region: Secondary | ICD-10-CM | POA: Diagnosis not present

## 2017-11-11 DIAGNOSIS — M542 Cervicalgia: Secondary | ICD-10-CM | POA: Diagnosis not present

## 2017-11-11 DIAGNOSIS — M25532 Pain in left wrist: Secondary | ICD-10-CM | POA: Diagnosis not present

## 2017-11-11 DIAGNOSIS — M9901 Segmental and somatic dysfunction of cervical region: Secondary | ICD-10-CM | POA: Diagnosis not present

## 2017-11-13 DIAGNOSIS — M25532 Pain in left wrist: Secondary | ICD-10-CM | POA: Diagnosis not present

## 2017-11-13 DIAGNOSIS — M542 Cervicalgia: Secondary | ICD-10-CM | POA: Diagnosis not present

## 2017-11-13 DIAGNOSIS — M9901 Segmental and somatic dysfunction of cervical region: Secondary | ICD-10-CM | POA: Diagnosis not present

## 2017-11-13 DIAGNOSIS — M9902 Segmental and somatic dysfunction of thoracic region: Secondary | ICD-10-CM | POA: Diagnosis not present

## 2017-11-19 DIAGNOSIS — L814 Other melanin hyperpigmentation: Secondary | ICD-10-CM | POA: Diagnosis not present

## 2017-11-19 DIAGNOSIS — R202 Paresthesia of skin: Secondary | ICD-10-CM | POA: Diagnosis not present

## 2017-11-19 DIAGNOSIS — D1801 Hemangioma of skin and subcutaneous tissue: Secondary | ICD-10-CM | POA: Diagnosis not present

## 2017-11-19 DIAGNOSIS — L570 Actinic keratosis: Secondary | ICD-10-CM | POA: Diagnosis not present

## 2017-11-19 DIAGNOSIS — D225 Melanocytic nevi of trunk: Secondary | ICD-10-CM | POA: Diagnosis not present

## 2017-11-19 DIAGNOSIS — L989 Disorder of the skin and subcutaneous tissue, unspecified: Secondary | ICD-10-CM | POA: Diagnosis not present

## 2017-11-19 DIAGNOSIS — D485 Neoplasm of uncertain behavior of skin: Secondary | ICD-10-CM | POA: Diagnosis not present

## 2017-11-21 DIAGNOSIS — M9901 Segmental and somatic dysfunction of cervical region: Secondary | ICD-10-CM | POA: Diagnosis not present

## 2017-11-21 DIAGNOSIS — M542 Cervicalgia: Secondary | ICD-10-CM | POA: Diagnosis not present

## 2017-11-21 DIAGNOSIS — M9902 Segmental and somatic dysfunction of thoracic region: Secondary | ICD-10-CM | POA: Diagnosis not present

## 2017-11-21 DIAGNOSIS — M25532 Pain in left wrist: Secondary | ICD-10-CM | POA: Diagnosis not present

## 2017-11-25 DIAGNOSIS — R293 Abnormal posture: Secondary | ICD-10-CM | POA: Diagnosis not present

## 2017-11-25 DIAGNOSIS — M9902 Segmental and somatic dysfunction of thoracic region: Secondary | ICD-10-CM | POA: Diagnosis not present

## 2017-11-25 DIAGNOSIS — M9903 Segmental and somatic dysfunction of lumbar region: Secondary | ICD-10-CM | POA: Diagnosis not present

## 2017-11-25 DIAGNOSIS — M9905 Segmental and somatic dysfunction of pelvic region: Secondary | ICD-10-CM | POA: Diagnosis not present

## 2017-11-25 DIAGNOSIS — M25532 Pain in left wrist: Secondary | ICD-10-CM | POA: Diagnosis not present

## 2017-11-25 DIAGNOSIS — M9901 Segmental and somatic dysfunction of cervical region: Secondary | ICD-10-CM | POA: Diagnosis not present

## 2017-11-26 DIAGNOSIS — M67442 Ganglion, left hand: Secondary | ICD-10-CM | POA: Diagnosis not present

## 2017-11-26 DIAGNOSIS — Z6826 Body mass index (BMI) 26.0-26.9, adult: Secondary | ICD-10-CM | POA: Diagnosis not present

## 2017-11-27 DIAGNOSIS — M9902 Segmental and somatic dysfunction of thoracic region: Secondary | ICD-10-CM | POA: Diagnosis not present

## 2017-11-27 DIAGNOSIS — M542 Cervicalgia: Secondary | ICD-10-CM | POA: Diagnosis not present

## 2017-11-27 DIAGNOSIS — M9901 Segmental and somatic dysfunction of cervical region: Secondary | ICD-10-CM | POA: Diagnosis not present

## 2017-11-27 DIAGNOSIS — M25532 Pain in left wrist: Secondary | ICD-10-CM | POA: Diagnosis not present

## 2017-11-28 DIAGNOSIS — M13842 Other specified arthritis, left hand: Secondary | ICD-10-CM | POA: Diagnosis not present

## 2017-11-28 DIAGNOSIS — M67442 Ganglion, left hand: Secondary | ICD-10-CM | POA: Diagnosis not present

## 2017-11-28 DIAGNOSIS — M79642 Pain in left hand: Secondary | ICD-10-CM | POA: Diagnosis not present

## 2017-11-28 DIAGNOSIS — M1812 Unilateral primary osteoarthritis of first carpometacarpal joint, left hand: Secondary | ICD-10-CM | POA: Diagnosis not present

## 2017-12-02 DIAGNOSIS — Z1211 Encounter for screening for malignant neoplasm of colon: Secondary | ICD-10-CM | POA: Diagnosis not present

## 2017-12-03 DIAGNOSIS — M9901 Segmental and somatic dysfunction of cervical region: Secondary | ICD-10-CM | POA: Diagnosis not present

## 2017-12-03 DIAGNOSIS — M25532 Pain in left wrist: Secondary | ICD-10-CM | POA: Diagnosis not present

## 2017-12-03 DIAGNOSIS — M542 Cervicalgia: Secondary | ICD-10-CM | POA: Diagnosis not present

## 2017-12-03 DIAGNOSIS — M9902 Segmental and somatic dysfunction of thoracic region: Secondary | ICD-10-CM | POA: Diagnosis not present

## 2017-12-05 DIAGNOSIS — M542 Cervicalgia: Secondary | ICD-10-CM | POA: Diagnosis not present

## 2017-12-05 DIAGNOSIS — M25532 Pain in left wrist: Secondary | ICD-10-CM | POA: Diagnosis not present

## 2017-12-05 DIAGNOSIS — M9902 Segmental and somatic dysfunction of thoracic region: Secondary | ICD-10-CM | POA: Diagnosis not present

## 2017-12-05 DIAGNOSIS — M9901 Segmental and somatic dysfunction of cervical region: Secondary | ICD-10-CM | POA: Diagnosis not present

## 2018-05-09 DIAGNOSIS — I10 Essential (primary) hypertension: Secondary | ICD-10-CM | POA: Diagnosis not present

## 2018-05-09 DIAGNOSIS — M859 Disorder of bone density and structure, unspecified: Secondary | ICD-10-CM | POA: Diagnosis not present

## 2018-05-09 DIAGNOSIS — Z125 Encounter for screening for malignant neoplasm of prostate: Secondary | ICD-10-CM | POA: Diagnosis not present

## 2018-05-09 DIAGNOSIS — E7849 Other hyperlipidemia: Secondary | ICD-10-CM | POA: Diagnosis not present

## 2018-05-09 DIAGNOSIS — R82998 Other abnormal findings in urine: Secondary | ICD-10-CM | POA: Diagnosis not present

## 2018-05-15 DIAGNOSIS — E7849 Other hyperlipidemia: Secondary | ICD-10-CM | POA: Diagnosis not present

## 2018-05-15 DIAGNOSIS — R69 Illness, unspecified: Secondary | ICD-10-CM | POA: Diagnosis not present

## 2018-05-15 DIAGNOSIS — G4733 Obstructive sleep apnea (adult) (pediatric): Secondary | ICD-10-CM | POA: Diagnosis not present

## 2018-05-15 DIAGNOSIS — E291 Testicular hypofunction: Secondary | ICD-10-CM | POA: Diagnosis not present

## 2018-05-15 DIAGNOSIS — I6389 Other cerebral infarction: Secondary | ICD-10-CM | POA: Diagnosis not present

## 2018-05-15 DIAGNOSIS — Z23 Encounter for immunization: Secondary | ICD-10-CM | POA: Diagnosis not present

## 2018-05-15 DIAGNOSIS — Z0000 Encounter for general adult medical examination without abnormal findings: Secondary | ICD-10-CM | POA: Diagnosis not present

## 2018-05-15 DIAGNOSIS — M67442 Ganglion, left hand: Secondary | ICD-10-CM | POA: Diagnosis not present

## 2018-05-15 DIAGNOSIS — D7589 Other specified diseases of blood and blood-forming organs: Secondary | ICD-10-CM | POA: Diagnosis not present

## 2018-05-15 DIAGNOSIS — M859 Disorder of bone density and structure, unspecified: Secondary | ICD-10-CM | POA: Diagnosis not present

## 2018-05-15 DIAGNOSIS — I10 Essential (primary) hypertension: Secondary | ICD-10-CM | POA: Diagnosis not present

## 2018-05-17 ENCOUNTER — Other Ambulatory Visit: Payer: Self-pay | Admitting: Internal Medicine

## 2018-05-17 DIAGNOSIS — E785 Hyperlipidemia, unspecified: Secondary | ICD-10-CM

## 2018-05-27 ENCOUNTER — Ambulatory Visit
Admission: RE | Admit: 2018-05-27 | Discharge: 2018-05-27 | Disposition: A | Discharge status: Home / Self Care | Payer: BLUE CROSS/BLUE SHIELD | Source: Ambulatory Visit | Attending: Internal Medicine | Admitting: Internal Medicine

## 2018-05-27 DIAGNOSIS — E785 Hyperlipidemia, unspecified: Secondary | ICD-10-CM

## 2018-05-28 ENCOUNTER — Other Ambulatory Visit: Payer: Self-pay | Admitting: Internal Medicine

## 2018-05-28 DIAGNOSIS — R911 Solitary pulmonary nodule: Secondary | ICD-10-CM

## 2018-06-05 DIAGNOSIS — I10 Essential (primary) hypertension: Secondary | ICD-10-CM | POA: Diagnosis not present

## 2018-06-05 DIAGNOSIS — R931 Abnormal findings on diagnostic imaging of heart and coronary circulation: Secondary | ICD-10-CM | POA: Diagnosis not present

## 2018-06-05 DIAGNOSIS — E7800 Pure hypercholesterolemia, unspecified: Secondary | ICD-10-CM | POA: Diagnosis not present

## 2018-06-05 DIAGNOSIS — Z8673 Personal history of transient ischemic attack (TIA), and cerebral infarction without residual deficits: Secondary | ICD-10-CM | POA: Diagnosis not present

## 2018-06-10 ENCOUNTER — Ambulatory Visit
Admission: RE | Admit: 2018-06-10 | Discharge: 2018-06-10 | Disposition: A | Discharge status: Home / Self Care | Payer: Medicare HMO | Source: Ambulatory Visit | Attending: Internal Medicine | Admitting: Internal Medicine

## 2018-06-10 DIAGNOSIS — J8410 Pulmonary fibrosis, unspecified: Secondary | ICD-10-CM | POA: Diagnosis not present

## 2018-06-10 DIAGNOSIS — R911 Solitary pulmonary nodule: Secondary | ICD-10-CM

## 2018-06-24 DIAGNOSIS — M79671 Pain in right foot: Secondary | ICD-10-CM | POA: Diagnosis not present

## 2018-06-26 DIAGNOSIS — E7800 Pure hypercholesterolemia, unspecified: Secondary | ICD-10-CM | POA: Diagnosis not present

## 2018-06-26 DIAGNOSIS — R931 Abnormal findings on diagnostic imaging of heart and coronary circulation: Secondary | ICD-10-CM | POA: Diagnosis not present

## 2018-06-30 DIAGNOSIS — R931 Abnormal findings on diagnostic imaging of heart and coronary circulation: Secondary | ICD-10-CM | POA: Diagnosis not present

## 2018-07-03 DIAGNOSIS — I10 Essential (primary) hypertension: Secondary | ICD-10-CM | POA: Diagnosis not present

## 2018-07-03 DIAGNOSIS — I2510 Atherosclerotic heart disease of native coronary artery without angina pectoris: Secondary | ICD-10-CM | POA: Diagnosis not present

## 2018-07-04 DIAGNOSIS — J302 Other seasonal allergic rhinitis: Secondary | ICD-10-CM | POA: Diagnosis not present

## 2018-07-04 DIAGNOSIS — Z823 Family history of stroke: Secondary | ICD-10-CM | POA: Diagnosis not present

## 2018-07-04 DIAGNOSIS — I2510 Atherosclerotic heart disease of native coronary artery without angina pectoris: Secondary | ICD-10-CM | POA: Diagnosis not present

## 2018-07-04 DIAGNOSIS — R69 Illness, unspecified: Secondary | ICD-10-CM | POA: Diagnosis not present

## 2018-07-04 DIAGNOSIS — E785 Hyperlipidemia, unspecified: Secondary | ICD-10-CM | POA: Diagnosis not present

## 2018-07-04 DIAGNOSIS — M1990 Unspecified osteoarthritis, unspecified site: Secondary | ICD-10-CM | POA: Diagnosis not present

## 2018-07-04 DIAGNOSIS — Z8673 Personal history of transient ischemic attack (TIA), and cerebral infarction without residual deficits: Secondary | ICD-10-CM | POA: Diagnosis not present

## 2018-07-04 DIAGNOSIS — Z803 Family history of malignant neoplasm of breast: Secondary | ICD-10-CM | POA: Diagnosis not present

## 2018-07-04 DIAGNOSIS — Z8249 Family history of ischemic heart disease and other diseases of the circulatory system: Secondary | ICD-10-CM | POA: Diagnosis not present

## 2018-07-04 DIAGNOSIS — I10 Essential (primary) hypertension: Secondary | ICD-10-CM | POA: Diagnosis not present

## 2018-07-10 DIAGNOSIS — Z8673 Personal history of transient ischemic attack (TIA), and cerebral infarction without residual deficits: Secondary | ICD-10-CM | POA: Diagnosis not present

## 2018-07-10 DIAGNOSIS — E7800 Pure hypercholesterolemia, unspecified: Secondary | ICD-10-CM | POA: Diagnosis not present

## 2018-07-10 DIAGNOSIS — I10 Essential (primary) hypertension: Secondary | ICD-10-CM | POA: Diagnosis not present

## 2018-07-10 DIAGNOSIS — R931 Abnormal findings on diagnostic imaging of heart and coronary circulation: Secondary | ICD-10-CM | POA: Diagnosis not present

## 2018-11-25 DIAGNOSIS — L821 Other seborrheic keratosis: Secondary | ICD-10-CM | POA: Diagnosis not present

## 2018-11-25 DIAGNOSIS — D225 Melanocytic nevi of trunk: Secondary | ICD-10-CM | POA: Diagnosis not present

## 2018-11-25 DIAGNOSIS — Z85828 Personal history of other malignant neoplasm of skin: Secondary | ICD-10-CM | POA: Diagnosis not present

## 2018-11-25 DIAGNOSIS — D1801 Hemangioma of skin and subcutaneous tissue: Secondary | ICD-10-CM | POA: Diagnosis not present

## 2019-06-23 DIAGNOSIS — Z125 Encounter for screening for malignant neoplasm of prostate: Secondary | ICD-10-CM | POA: Diagnosis not present

## 2019-06-23 DIAGNOSIS — E7849 Other hyperlipidemia: Secondary | ICD-10-CM | POA: Diagnosis not present

## 2019-06-23 DIAGNOSIS — M859 Disorder of bone density and structure, unspecified: Secondary | ICD-10-CM | POA: Diagnosis not present

## 2019-06-25 DIAGNOSIS — R82998 Other abnormal findings in urine: Secondary | ICD-10-CM | POA: Diagnosis not present

## 2019-06-25 DIAGNOSIS — I10 Essential (primary) hypertension: Secondary | ICD-10-CM | POA: Diagnosis not present

## 2019-06-30 DIAGNOSIS — I6389 Other cerebral infarction: Secondary | ICD-10-CM | POA: Diagnosis not present

## 2019-06-30 DIAGNOSIS — Z0000 Encounter for general adult medical examination without abnormal findings: Secondary | ICD-10-CM | POA: Diagnosis not present

## 2019-06-30 DIAGNOSIS — R918 Other nonspecific abnormal finding of lung field: Secondary | ICD-10-CM | POA: Diagnosis not present

## 2019-06-30 DIAGNOSIS — M8580 Other specified disorders of bone density and structure, unspecified site: Secondary | ICD-10-CM | POA: Diagnosis not present

## 2019-06-30 DIAGNOSIS — Z77090 Contact with and (suspected) exposure to asbestos: Secondary | ICD-10-CM | POA: Diagnosis not present

## 2019-06-30 DIAGNOSIS — I2510 Atherosclerotic heart disease of native coronary artery without angina pectoris: Secondary | ICD-10-CM | POA: Diagnosis not present

## 2019-06-30 DIAGNOSIS — M67442 Ganglion, left hand: Secondary | ICD-10-CM | POA: Diagnosis not present

## 2019-06-30 DIAGNOSIS — D7589 Other specified diseases of blood and blood-forming organs: Secondary | ICD-10-CM | POA: Diagnosis not present

## 2019-06-30 DIAGNOSIS — I77810 Thoracic aortic ectasia: Secondary | ICD-10-CM | POA: Diagnosis not present

## 2019-06-30 DIAGNOSIS — L929 Granulomatous disorder of the skin and subcutaneous tissue, unspecified: Secondary | ICD-10-CM | POA: Diagnosis not present

## 2019-06-30 DIAGNOSIS — Z1331 Encounter for screening for depression: Secondary | ICD-10-CM | POA: Diagnosis not present

## 2019-07-01 ENCOUNTER — Other Ambulatory Visit: Payer: Self-pay | Admitting: Internal Medicine

## 2019-07-01 DIAGNOSIS — R69 Illness, unspecified: Secondary | ICD-10-CM | POA: Diagnosis not present

## 2019-07-01 DIAGNOSIS — Z77090 Contact with and (suspected) exposure to asbestos: Secondary | ICD-10-CM

## 2019-07-01 DIAGNOSIS — I77810 Thoracic aortic ectasia: Secondary | ICD-10-CM

## 2019-07-01 DIAGNOSIS — K134 Granuloma and granuloma-like lesions of oral mucosa: Secondary | ICD-10-CM

## 2019-07-08 DIAGNOSIS — Z1212 Encounter for screening for malignant neoplasm of rectum: Secondary | ICD-10-CM | POA: Diagnosis not present

## 2019-07-15 ENCOUNTER — Ambulatory Visit
Admission: RE | Admit: 2019-07-15 | Discharge: 2019-07-15 | Disposition: A | Discharge status: Home / Self Care | Payer: Medicare HMO | Source: Ambulatory Visit | Attending: Internal Medicine | Admitting: Internal Medicine

## 2019-07-15 DIAGNOSIS — Z77090 Contact with and (suspected) exposure to asbestos: Secondary | ICD-10-CM

## 2019-07-15 DIAGNOSIS — J8410 Pulmonary fibrosis, unspecified: Secondary | ICD-10-CM | POA: Diagnosis not present

## 2019-07-15 DIAGNOSIS — I77810 Thoracic aortic ectasia: Secondary | ICD-10-CM

## 2019-07-15 DIAGNOSIS — K134 Granuloma and granuloma-like lesions of oral mucosa: Secondary | ICD-10-CM

## 2019-07-20 DIAGNOSIS — H524 Presbyopia: Secondary | ICD-10-CM | POA: Diagnosis not present

## 2019-07-20 DIAGNOSIS — Z0100 Encounter for examination of eyes and vision without abnormal findings: Secondary | ICD-10-CM | POA: Diagnosis not present

## 2019-07-20 DIAGNOSIS — H52223 Regular astigmatism, bilateral: Secondary | ICD-10-CM | POA: Diagnosis not present

## 2019-07-20 DIAGNOSIS — H5203 Hypermetropia, bilateral: Secondary | ICD-10-CM | POA: Diagnosis not present

## 2019-07-20 DIAGNOSIS — H2513 Age-related nuclear cataract, bilateral: Secondary | ICD-10-CM | POA: Diagnosis not present

## 2020-02-10 DIAGNOSIS — M25571 Pain in right ankle and joints of right foot: Secondary | ICD-10-CM | POA: Diagnosis not present

## 2020-02-10 DIAGNOSIS — M13851 Other specified arthritis, right hip: Secondary | ICD-10-CM | POA: Diagnosis not present

## 2020-02-10 DIAGNOSIS — M25551 Pain in right hip: Secondary | ICD-10-CM | POA: Diagnosis not present

## 2020-03-18 IMAGING — CT CT HEART SCORING
3 series · 13 of 20 positions shown, 15 images · non-contrast
Comparison: None.

CLINICAL DATA: 67-year-old white male with elevated cholesterol.
Family history of heart disease. Former smoker.

EXAM:
CT HEART FOR CALCIUM SCORING
TECHNIQUE: CT heart was performed on a 64 channel system using prospective ECG
gating.
A non-contrast exam for calcium scoring was performed.
Note that this exam targets the heart and the chest was not imaged
in its entirety.

[Series 2: calcium scoring 2.00 qr36 bestdiast 70% · axial · 0.39mm/px · z∈[+1652,+1708]mm · 3 of 70 slices shown]
[im 14/70  vessel]
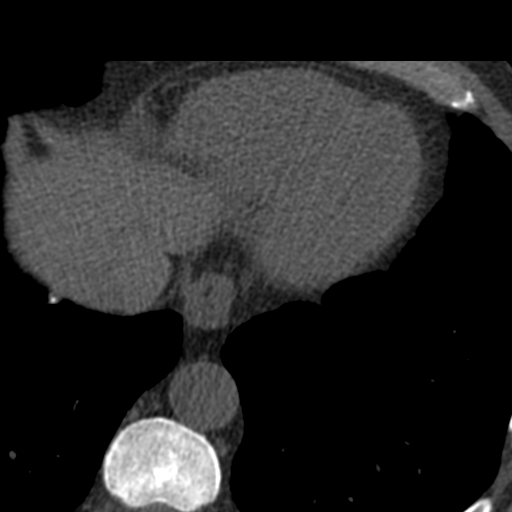
[im 28/70  vessel]
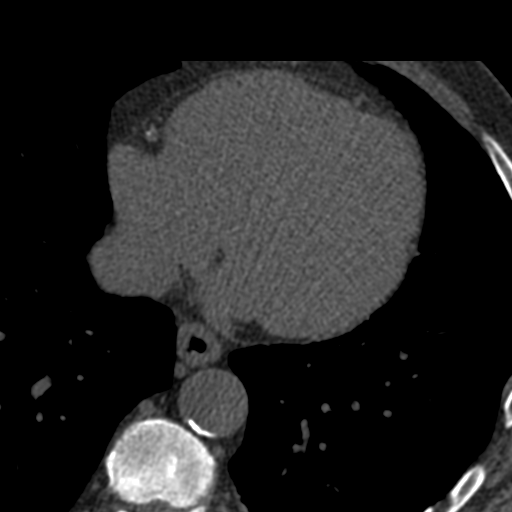
[im 42/70  vessel]
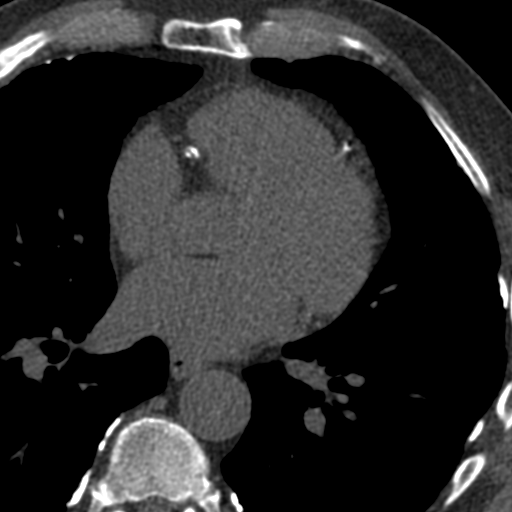

[Series 3: calcium scoring 2.00 br40 bestdiast 70% ax fov · axial · 0.57mm/px · z∈[+1648,+1740]mm · 5 of 70 slices shown, 7 images]
[im 12/70  vessel]
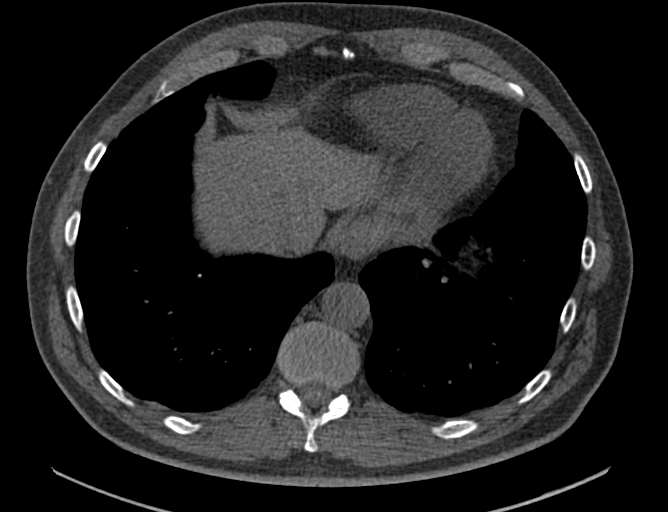
[im 12/70  lung]
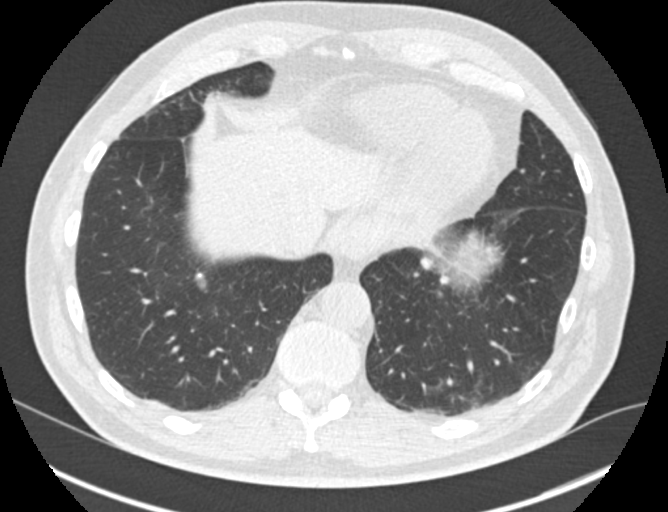
[im 24/70  vessel]
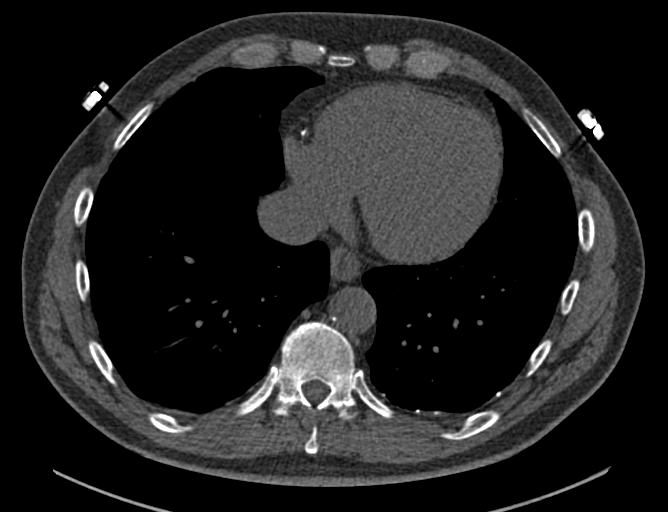
[im 35/70  vessel]
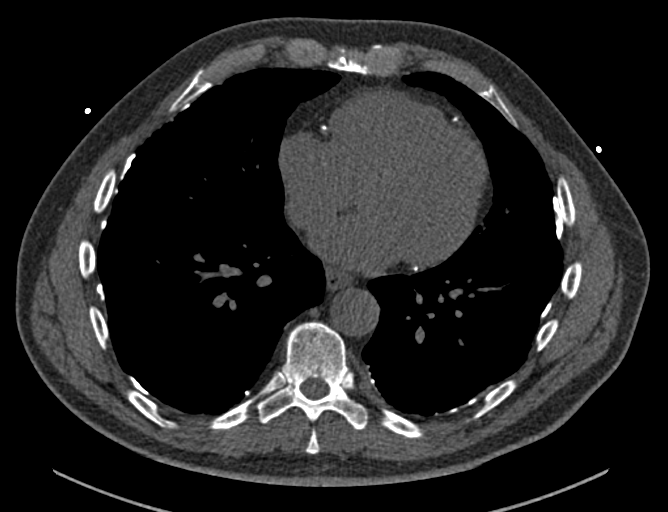
[im 47/70  vessel]
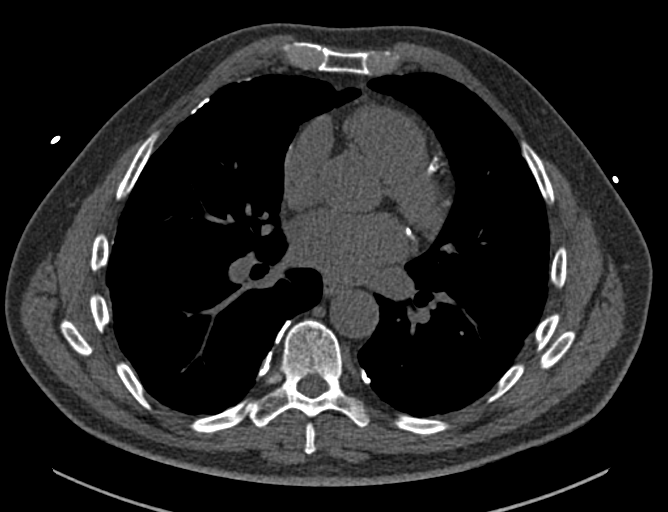
[im 58/70  vessel]
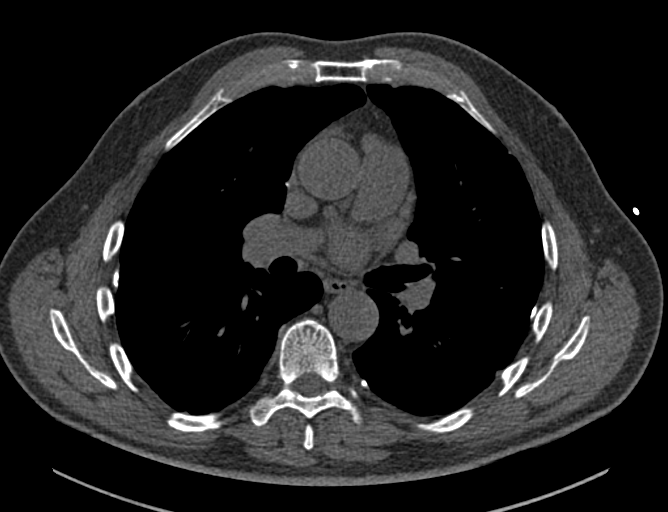
[im 58/70  lung]
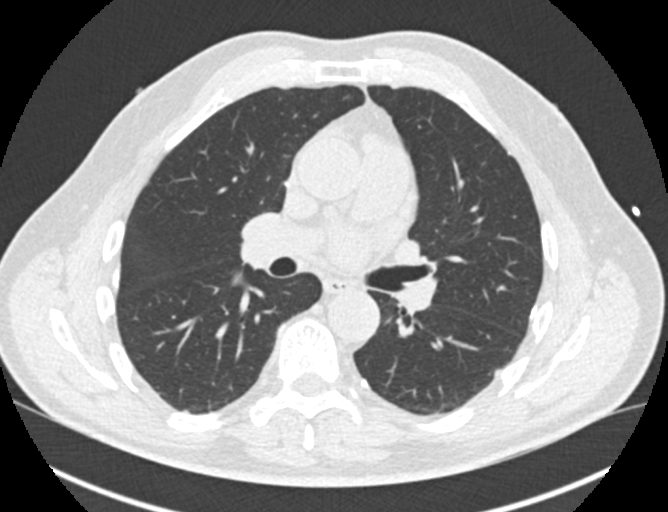

[Series 9: calcium scoring 2.00 br60 bestdiast 70% ax fov · axial · 0.57mm/px · z∈[+1648,+1740]mm · 5 of 70 slices shown]
[im 12/70  vessel]
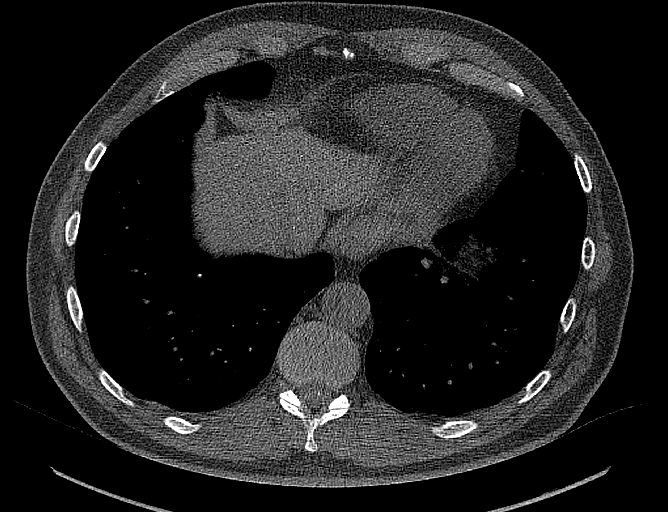
[im 24/70  vessel]
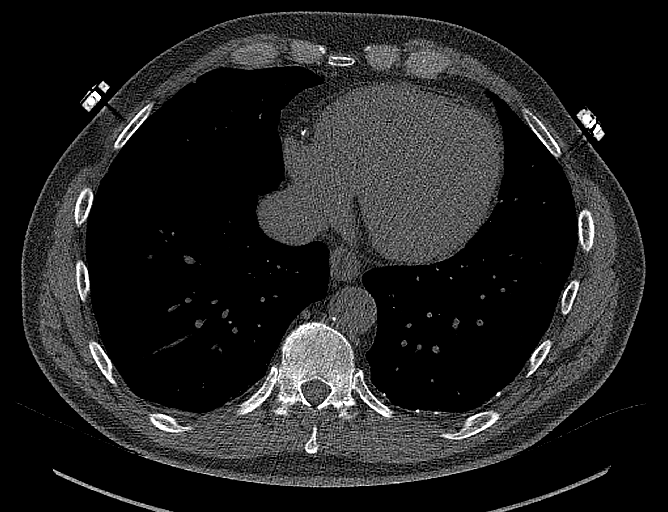
[im 35/70  vessel]
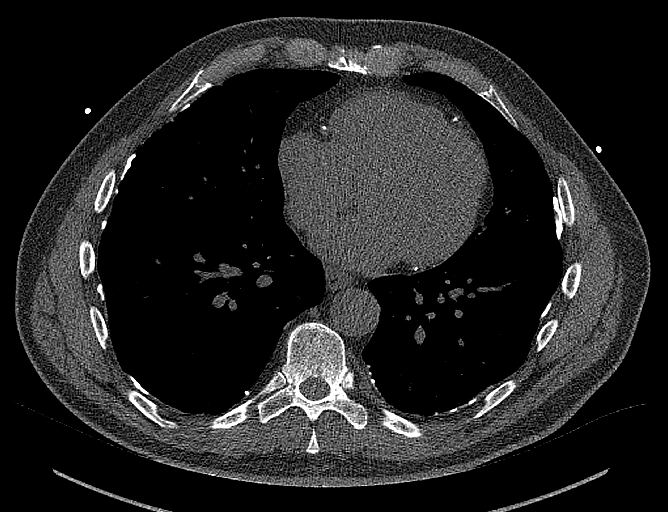
[im 47/70  vessel]
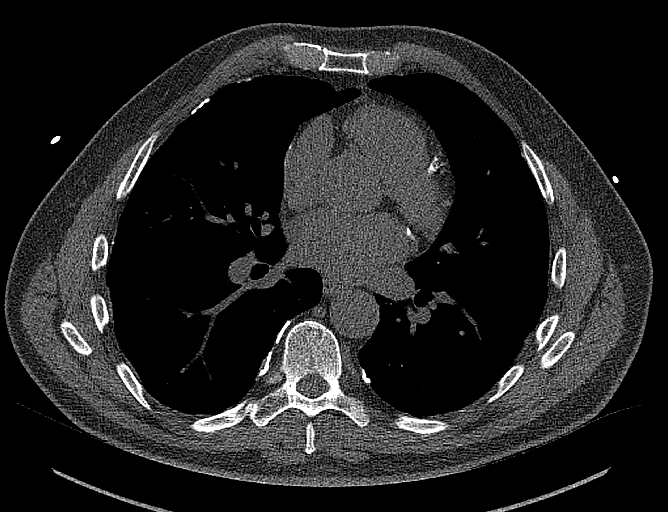
[im 58/70  vessel]
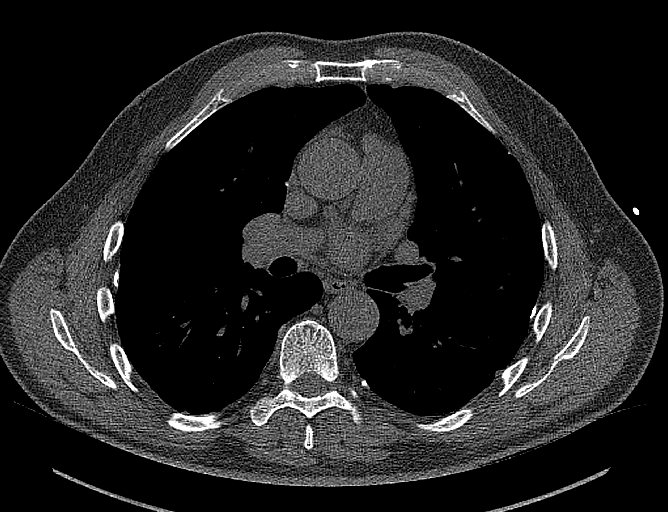

[13 of 20 positions shown; findings below may reference images not displayed]

FINDINGS: Technical quality: Good.

CORONARY CALCIUM

Total Agatston Score: 5083

[HOSPITAL] percentile:  94

OTHER FINDINGS:

Cardiovascular: Extensive coronary artery calcium, particularly in
the LAD and right coronary artery. Ascending thoracic aorta is
mildly ectatic measuring 3.8 cm. Atherosclerotic wall calcifications
in the descending thoracic aorta. Normal caliber of the main
pulmonary arteries.

Mediastinum/Nodes: Mediastinal structures are unremarkable without
significant lymph node enlargement. The visualized esophagus is
unremarkable.

Lungs/Pleura: Bilateral pleural calcifications. No large pleural
effusions. Calcified granuloma in the right lower lobe measuring 4
mm on sequence 9, image 56. Irregular nodular density in the medial
right lower lobe on sequence 9, image 57 measures up to 5 mm on the
axial images but up to 6 mm on the coronal reformats, sequence 5,
image 85. Small amount of nodularity or thickening along the right
major fissure on sequence 9, image 43. Small amount of thickening or
nodularity along the left major fissure on sequence 9, image 25. No
significant consolidation in the visualized lungs.

Upper Abdomen: Images of the upper abdomen are unremarkable.

Musculoskeletal: No suspicious bone findings.
IMPRESSION: 1. Coronary calcium score is 5083 and this is at percentile 94 for
subjects of the same age, gender and ethnicity.
2. Bilateral pleural calcifications. Findings are suggestive for
prior asbestos exposure.
3. Indeterminate 6 mm nodule in the medial right lower lobe.
Non-contrast chest CT at 6-12 months is recommended. If the nodule
is stable at time of repeat CT, then future CT at 18-24 months (from
today's scan) is considered optional for low-risk patients, but is
recommended for high-risk patients. This recommendation follows the
consensus statement: Guidelines for Management of Incidental
Pulmonary Nodules Detected on CT Images: From the [HOSPITAL]
4.  Aortic Atherosclerosis ([id number].W).

## 2020-05-03 DIAGNOSIS — R69 Illness, unspecified: Secondary | ICD-10-CM | POA: Diagnosis not present

## 2020-06-27 DIAGNOSIS — Z125 Encounter for screening for malignant neoplasm of prostate: Secondary | ICD-10-CM | POA: Diagnosis not present

## 2020-06-27 DIAGNOSIS — M859 Disorder of bone density and structure, unspecified: Secondary | ICD-10-CM | POA: Diagnosis not present

## 2020-06-27 DIAGNOSIS — E291 Testicular hypofunction: Secondary | ICD-10-CM | POA: Diagnosis not present

## 2020-06-27 DIAGNOSIS — E785 Hyperlipidemia, unspecified: Secondary | ICD-10-CM | POA: Diagnosis not present

## 2020-07-01 ENCOUNTER — Other Ambulatory Visit: Payer: Self-pay | Admitting: Internal Medicine

## 2020-07-01 DIAGNOSIS — I6389 Other cerebral infarction: Secondary | ICD-10-CM | POA: Diagnosis not present

## 2020-07-01 DIAGNOSIS — Z1331 Encounter for screening for depression: Secondary | ICD-10-CM | POA: Diagnosis not present

## 2020-07-01 DIAGNOSIS — E291 Testicular hypofunction: Secondary | ICD-10-CM | POA: Diagnosis not present

## 2020-07-01 DIAGNOSIS — I2510 Atherosclerotic heart disease of native coronary artery without angina pectoris: Secondary | ICD-10-CM | POA: Diagnosis not present

## 2020-07-01 DIAGNOSIS — Z0000 Encounter for general adult medical examination without abnormal findings: Secondary | ICD-10-CM | POA: Diagnosis not present

## 2020-07-01 DIAGNOSIS — I77810 Thoracic aortic ectasia: Secondary | ICD-10-CM | POA: Diagnosis not present

## 2020-07-01 DIAGNOSIS — R011 Cardiac murmur, unspecified: Secondary | ICD-10-CM | POA: Diagnosis not present

## 2020-07-01 DIAGNOSIS — I10 Essential (primary) hypertension: Secondary | ICD-10-CM | POA: Diagnosis not present

## 2020-07-01 DIAGNOSIS — E785 Hyperlipidemia, unspecified: Secondary | ICD-10-CM | POA: Diagnosis not present

## 2020-07-01 DIAGNOSIS — R82998 Other abnormal findings in urine: Secondary | ICD-10-CM | POA: Diagnosis not present

## 2020-07-01 DIAGNOSIS — G4733 Obstructive sleep apnea (adult) (pediatric): Secondary | ICD-10-CM | POA: Diagnosis not present

## 2020-07-01 DIAGNOSIS — Z1212 Encounter for screening for malignant neoplasm of rectum: Secondary | ICD-10-CM | POA: Diagnosis not present

## 2020-07-04 ENCOUNTER — Other Ambulatory Visit: Payer: Self-pay | Admitting: Internal Medicine

## 2020-07-04 DIAGNOSIS — I77810 Thoracic aortic ectasia: Secondary | ICD-10-CM

## 2020-07-07 ENCOUNTER — Ambulatory Visit: Payer: Medicare HMO | Attending: Internal Medicine

## 2020-07-07 DIAGNOSIS — Z23 Encounter for immunization: Secondary | ICD-10-CM

## 2020-07-07 NOTE — Progress Notes (Signed)
   Covid-19 Vaccination Clinic  Name:  Arpan Eskelson    MRN: [id number] DOB: 1950-07-27  07/07/2020  Mr. Batty was observed post Covid-19 immunization for 15 minutes without incident. He was provided with Vaccine Information Sheet and instruction to access the V-Safe system.   Mr. Bloom was instructed to call 911 with any severe reactions post vaccine: Marland Kitchen Difficulty breathing  . Swelling of face and throat  . A fast heartbeat  . A bad rash all over body  . Dizziness and weakness   Immunizations Administered    Name Date Dose VIS Date Route   Pfizer COVID-19 Vaccine 07/07/2020  4:40 PM 0.3 mL 05/11/2020 Intramuscular   Manufacturer: [place]   Lot: [id number]   [place]: [id number]

## 2020-07-13 DIAGNOSIS — E785 Hyperlipidemia, unspecified: Secondary | ICD-10-CM | POA: Diagnosis not present

## 2020-07-13 DIAGNOSIS — K219 Gastro-esophageal reflux disease without esophagitis: Secondary | ICD-10-CM | POA: Diagnosis not present

## 2020-07-13 DIAGNOSIS — I10 Essential (primary) hypertension: Secondary | ICD-10-CM | POA: Diagnosis not present

## 2020-07-13 DIAGNOSIS — Z1211 Encounter for screening for malignant neoplasm of colon: Secondary | ICD-10-CM | POA: Diagnosis not present

## 2020-08-02 ENCOUNTER — Other Ambulatory Visit: Payer: Medicare HMO

## 2020-11-10 DIAGNOSIS — K2100 Gastro-esophageal reflux disease with esophagitis, without bleeding: Secondary | ICD-10-CM | POA: Diagnosis not present

## 2020-11-10 DIAGNOSIS — K2101 Gastro-esophageal reflux disease with esophagitis, with bleeding: Secondary | ICD-10-CM | POA: Diagnosis not present

## 2020-11-10 DIAGNOSIS — D123 Benign neoplasm of transverse colon: Secondary | ICD-10-CM | POA: Diagnosis not present

## 2020-11-10 DIAGNOSIS — K2289 Other specified disease of esophagus: Secondary | ICD-10-CM | POA: Diagnosis not present

## 2020-11-10 DIAGNOSIS — K5730 Diverticulosis of large intestine without perforation or abscess without bleeding: Secondary | ICD-10-CM | POA: Diagnosis not present

## 2020-11-10 DIAGNOSIS — K449 Diaphragmatic hernia without obstruction or gangrene: Secondary | ICD-10-CM | POA: Diagnosis not present

## 2020-11-10 DIAGNOSIS — K635 Polyp of colon: Secondary | ICD-10-CM | POA: Diagnosis not present

## 2020-11-10 DIAGNOSIS — R12 Heartburn: Secondary | ICD-10-CM | POA: Diagnosis not present

## 2020-11-10 DIAGNOSIS — Z1211 Encounter for screening for malignant neoplasm of colon: Secondary | ICD-10-CM | POA: Diagnosis not present

## 2020-12-30 DIAGNOSIS — L929 Granulomatous disorder of the skin and subcutaneous tissue, unspecified: Secondary | ICD-10-CM | POA: Diagnosis not present

## 2020-12-30 DIAGNOSIS — E785 Hyperlipidemia, unspecified: Secondary | ICD-10-CM | POA: Diagnosis not present

## 2020-12-30 DIAGNOSIS — I10 Essential (primary) hypertension: Secondary | ICD-10-CM | POA: Diagnosis not present

## 2020-12-30 DIAGNOSIS — I77810 Thoracic aortic ectasia: Secondary | ICD-10-CM | POA: Diagnosis not present

## 2020-12-30 DIAGNOSIS — R011 Cardiac murmur, unspecified: Secondary | ICD-10-CM | POA: Diagnosis not present

## 2020-12-30 DIAGNOSIS — Z0000 Encounter for general adult medical examination without abnormal findings: Secondary | ICD-10-CM | POA: Diagnosis not present

## 2020-12-30 DIAGNOSIS — Z1331 Encounter for screening for depression: Secondary | ICD-10-CM | POA: Diagnosis not present

## 2020-12-30 DIAGNOSIS — I2510 Atherosclerotic heart disease of native coronary artery without angina pectoris: Secondary | ICD-10-CM | POA: Diagnosis not present

## 2021-05-11 ENCOUNTER — Ambulatory Visit (INDEPENDENT_AMBULATORY_CARE_PROVIDER_SITE_OTHER): Payer: Medicare HMO | Admitting: Neurology

## 2021-05-11 ENCOUNTER — Encounter: Payer: Self-pay | Admitting: Neurology

## 2021-05-11 DIAGNOSIS — G4730 Sleep apnea, unspecified: Secondary | ICD-10-CM | POA: Diagnosis not present

## 2021-05-11 DIAGNOSIS — R0683 Snoring: Secondary | ICD-10-CM | POA: Insufficient documentation

## 2021-05-11 DIAGNOSIS — G459 Transient cerebral ischemic attack, unspecified: Secondary | ICD-10-CM

## 2021-05-11 DIAGNOSIS — Z9989 Dependence on other enabling machines and devices: Secondary | ICD-10-CM | POA: Diagnosis not present

## 2021-05-11 DIAGNOSIS — G4733 Obstructive sleep apnea (adult) (pediatric): Secondary | ICD-10-CM | POA: Diagnosis not present

## 2021-05-11 DIAGNOSIS — G4710 Hypersomnia, unspecified: Secondary | ICD-10-CM | POA: Diagnosis not present

## 2021-05-11 MED ORDER — EZETIMIBE 10 MG PO TABS: 10.0000 mg | ORAL_TABLET | Freq: Every day | ORAL | 0 refills | Status: AC

## 2021-05-11 NOTE — Addendum Note (Signed)
Addended by: Larey Seat on: 05/11/2021 12:36 PM   Modules accepted: Orders

## 2021-05-11 NOTE — Progress Notes (Addendum)
[facility]  Provider:  Larey Rogers, M D  Referring Provider: [name], MD Primary Care Physician:  Caleb Infante, MD  Chief Complaint  Patient presents with   New Patient (Initial Visit)    Rm 10, alone. Paper referral for OSA. Prior SS done in 2014.current CPAP user, would like to get a new CPAP as his current mask is broken and no longer to able to use his CPAP. Pt reports doing well on CPAP.     HPI:  Caleb Rogers is a 70 y.o. male  and  seen here upon referral  from Dr. Joylene Rogers for a new Sleep evaluation. He was not seen for over 3 years and I have personally not seen him in a longer time.   I am seeing on 05-11-2021 this meanwhile 70 year-old  Caucasian, right-handed gentleman presents today upon request of his spouse.  He uses CPAP and his wife is happy he doesn't snore. His mask broke and he tried to fix it with duct tape and he is snoring again. CPAP pressure of 7 cm water . The patient underwent a sleep study at the time referred by [name], MD and in the sleep study tried to do a split-night protocol.  It was performed on 07-12-2013 the patient was diagnosed with an overall apnea index of 37.6/h was moderately to loud snoring, his REM AHI was 67/h and his nonsupine AHI was surprisingly much higher than his AHI in supine sleep position.  Nadir of oxygen was 78% saturation but occurring during supine REM sleep there was 38 minutes of total desaturation time.  Heart rate was average 64 bpm.  CPAP corrected hypoxemia to a total desaturation time of 3.8 minutes, 7 cmH2O allowed for an AHI of 1.8.  Crescendo snoring was much less loud after treatment on CPAP .   He was initially placed on a nuance medium size nasal pillow mask initially but has meanwhile changed to a new nose mask.   Addendum : the patient brought his mask and headgear to the office after this visit., He uses a LARGE size Hardin Negus made [employer] nasal cradle.    The patient  states that he falls asleep very quickly and promptly when going to bed, his regular bedtime as Pan, taken only a couple of minutes to fall asleep. Sleeps through the night except for one bathroom break at about 4 AM. He noted nor difficulties to reinitiate sleep after this nocturia. He rises at 6.45 , spontaneously waking, no alarm needed.   He shares his bedroom with his wife who is bothered by his snoring.  He sleeps on 2 pillows. Flat bed.  He dreams and recalls dreaming. No enactment of dreams.  He doesn't watch TV or read in bed.  No morning headaches, but  dry mouth , no rhinitis, as long as he uses his CPAP.   No family history of sleep disorders, father took naps ( getting up at 4 AM , neurosurgeon ) died in his 14's.    He naps 2-3 times a month only. POWERNAPPER -10-15 minutes.   - after these Power naps, he feels refreshed and recharged.  He has 1-2 cups of coffee at 10 AM, drinks hot tea in winter. 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 He is exposed to daylight. Works in and outdoors.  He has been cutting down on work hours as he approached his 5s . No further use of caffeine after morning  time, takes power naps about 2-3 days a week.  No shift work history.  Has reduced his wine consumption to 1-2 glasses of white wine daily ( last assessment from 7 years ago) .  Daily exercises pilates, yoga, gym.  He exercises daily , feels that helps him with stress. Attends meditation.   2015- TIA/ CVA: Caleb Rogers is an 70 y.o. male who admits to drinking 4 glasses of wine daily but does not smoke. Patient went to sleep last night feeling well. He awoke this am and noted he was listing to the right when he was walking. He denies any inner ear infection, fullness, ringing, vertigo, diplopia, dysphagia. He also denies any decreased sensation or weakness. He states he has never had symptoms like this prior.    The obtained brain MRI and MRA brain were overall  unremarkable, showed remote R cerebellar infarct, absent signal within R vert.     Review of Systems: Out of a complete 14 system review, the patient complains of only the following symptoms, and all other reviewed systems are negative. Snoring, daytime naps. EDS 1 , FSS zero.   How likely are you to doze in the following situations: 0 = not likely, 1 = slight chance, 2 = moderate chance, 3 = high chance  Sitting and Reading? Watching Television? Sitting inactive in a public place (theater or meeting)? Lying down in the afternoon when circumstances permit? Sitting and talking to someone? Sitting quietly after lunch without alcohol? In a car, while stopped for a few minutes in traffic? As a passenger in a car for an hour without a break?  Total = 3/ 24 on CPAP.   05-11-2021: Now that CPAP hasn't been used for 3 weeks,  Epworth is now increased due to interrupted therapy and back up to 12/ 24.    Social History   Socioeconomic History   Marital status: Married    Spouse name: Caleb Rogers   Number of children: 2   Years of education: college   Highest education level: Not on file  Occupational History   Occupation: Banker: Building surveyor FOR SELF EMPLOYED  Tobacco Use   Smoking status: Former    Types: Cigarettes    Quit date: 07/23/1968    Years since quitting: 52.8   Smokeless tobacco: Never   Tobacco comments:    pt quit July 1997  Substance and Sexual Activity   Alcohol use: Yes    Alcohol/week: 3.0 standard drinks    Types: 3 Glasses of wine per week    Comment: SOCIAL   Drug use: No   Sexual activity: Yes    Partners: Female  Other Topics Concern   Not on file  Social History Narrative   Patient is married Caleb Rogers) and lives at home with his wife.   Patient has two adult children.   Patient is working full-time.   Patient has a college education.   Patient is right- handed.   Patient drinks two cups of coffee daily.   Social Determinants of Health    Financial Resource Strain: Not on file  Food Insecurity: Not on file  Transportation Needs: Not on file  Physical Activity: Not on file  Stress: Not on file  Social Connections: Not on file  Intimate Partner Violence: Not on file    Family History  Problem Relation Age of Onset   Cancer Mother    Congestive Heart Failure Mother    Coronary artery disease Father  Coronary artery disease Brother 16    Past Medical History:  Diagnosis Date   Gait instability 08/2013   GERD (gastroesophageal reflux disease)    Impaired fasting glucose    OSA (obstructive sleep apnea) 02/24/2014   Pure hypercholesterolemia    Sleep apnea     Past Surgical History:  Procedure Laterality Date   COLONOSCOPY     none     TEE WITHOUT CARDIOVERSION N/A 11/11/2014   Procedure: TRANSESOPHAGEAL ECHOCARDIOGRAM (TEE);  Surgeon: Adrian Prows, MD;  Location: [facility] ENDOSCOPY;  Service: Cardiovascular;  Laterality: N/A;  h/p in file drawer    Current Outpatient Medications  Medication Sig Dispense Refill   aspirin EC 81 MG EC tablet Take 1 tablet (81 mg total) by mouth daily.     B Complex-C (SUPER B COMPLEX PO) Take 0.5 tablets by mouth every other day.     lisinopril-hydrochlorothiazide (PRINZIDE,ZESTORETIC) 20-12.5 MG tablet TK 1 T PO QAM  2   omeprazole (PRILOSEC) 20 MG capsule Take 20 mg by mouth daily as needed.      REPATHA SURECLICK 030 MG/ML SOAJ Inject into the skin.     No current facility-administered medications for this visit.    Allergies as of 05/11/2021   (No Known Allergies)    Vitals: There were no vitals taken for this visit. Last Weight:  Wt Readings from Last 1 Encounters:  01/04/16 182 lb 12.8 oz (82.9 kg)   Last Height:   Ht Readings from Last 1 Encounters:  01/04/16 5\' 10"  (1.778 m)    Physical exam:  General: The patient is awake, alert and appears not in acute distress. The patient is well groomed. Head: Normocephalic, atraumatic. Neck is supple. Mallampati 2-3, red  and  elongated uvula,  no TMJ and no lateral restriction. , neck circumference: 15."  Cardiovascular:  Regular rate and rhythm, without  murmurs or carotid bruit, and without distended neck veins. Respiratory: Lungs are clear to auscultation. Skin:  Without evidence of edema, or rash, facial redness.  Trunk: BMI is 26 , patient has normal posture.  Neurologic exam : The patient is awake and alert, oriented to place and time.  Memory subjective  described as intact. There is a normal attention span & concentration ability.  Speech is fluent without  dysarthria, dysphonia or aphasia. Mood and affect are appropriate.  Cranial nerves: Pupils are equal and briskly reactive to light. Funduscopic exam without  evidence of pallor or edema. Extraocular movements  in vertical and horizontal planes intact and without nystagmus. Visual fields by finger perimetry are intact. Hearing to finger rub intact. Facial sensation intact to fine touch. Facial motor strength is symmetric and tongue and uvula move midline.  Motor exam:  Normal tone and normal muscle bulk and symmetric normal strength in all extremities. Symmetric strength.   Sensory:  Fine touch, pinprick and vibration were normal in all extremities.  Proprioception is tested in the upper extremities was normal.  Coordination: Rapid alternating movements in the fingers/hands is tested and normal.  Finger-to-nose maneuver tested and normal without evidence of ataxia, dysmetria or tremor.  Gait and station:  balance is reduced, standing on one foot - manages 10 sec, bare. Patient walks without assistive device . Strength within normal limits. Stance is stable and normal. Tandem gait unfragmented. Romberg testing is normal.  Deep tendon reflexes: in the upper and lower extremities are symmetric and intact.  Babinski maneuver response is downgoing.    Assessment:  After physical and neurologic examination, review  of laboratory studies, imaging,  neurophysiology testing and pre-existing records, assessment is :  0) TIA several years ago but convinced of right upper extremity involvement. Tongue on the right, right big toe-  Has hyperlipidemia, on Repatha.   1) longstanding OSA on CPP, needs urgently new supplies.     Plan:  Treatment plan and additional workup :   1)  HST:  For this patient , unless not covered by insurance.  2) Addendum : the patient brought his mask and headgear to the office after this visit., He uses a LARGE size Hardin Negus made [employer] nasal cradle.  I like for him to get this replaced until we can replace his machine .CPAP pressure of 7 cm water,his DME does no longer exist.   3) balance training in Pilates, no need for referral.balance problem can be related to remote cerebellar infarct, but also to age, and some alcohol use.     RV in 2 months before health insurance starts over again.     Caleb Seat, MD

## 2021-05-19 DIAGNOSIS — G4733 Obstructive sleep apnea (adult) (pediatric): Secondary | ICD-10-CM | POA: Diagnosis not present

## 2021-05-24 ENCOUNTER — Ambulatory Visit (INDEPENDENT_AMBULATORY_CARE_PROVIDER_SITE_OTHER): Payer: Medicare HMO | Admitting: Neurology

## 2021-05-24 DIAGNOSIS — G4733 Obstructive sleep apnea (adult) (pediatric): Secondary | ICD-10-CM | POA: Diagnosis not present

## 2021-05-24 DIAGNOSIS — Z9989 Dependence on other enabling machines and devices: Secondary | ICD-10-CM

## 2021-05-24 DIAGNOSIS — G459 Transient cerebral ischemic attack, unspecified: Secondary | ICD-10-CM

## 2021-05-29 NOTE — Progress Notes (Signed)
[facility] TEST REPORT ( by Watch PAT)   STUDY DATE:  05-26-2021 DOB:  [birth year]    ORDERING CLINICIAN: Larey Seat, MD  REFERRING CLINICIAN: Dr [name], MD    CLINICAL INFORMATION/HISTORY: Needs new machine and supplies. 05-11-2021 this meanwhile 70 year-old gentleman uses a CPAP and his wife is happy he doesn't snore. His mask broke and he tried to fix it with duct tape and now he is snoring again. CPAP pressure is set at 7 cm water for over 7 years. The patient underwent a sleep study in 2014 and was referred by Dr. Carolann Littler, MD at that time,07-12-2013 :the patient was diagnosed with an AHI of 37.6/h with moderately to loud snoring, his REM AHI was 67/h and his nonsupine AHI was surprisingly much higher than his AHI in supine sleep position.   Nadir of oxygen was 78% saturation but occurring during supine REM sleep there was 38 minutes of total desaturation time.  CPAP corrected hypoxemia to a total desaturation time of 3.8 minutes, 7 cmH2O allowed for an AHI of 1.8.  Crescendo snoring was much less loud after treatment on CPAP .   He has meanwhile changed to a LARGE size Hardin Negus made [employer].   Epworth sleepiness score: 12 /24.   BMI: 28 kg/m   Neck Circumference: 15"    Sleep Summary:   Total Recording Time (hours, min): A total recording time amounted to 8 hours and 46 minutes of which 7 hours and 40 minutes were calculated total sleep time.  REM sleep was present for 22.7% of total sleep time.                                 Respiratory Indices:   Calculated pAHI (per hour):   The overall AHI was 5.5/h during REM sleep accentuated to 14.5/h and in non-REM sleep 2.8/h.   The RDI or respiratory disturbance index overall was 12.2/h in rem sleep 20.9/h and in non-REM sleep RDI was 9.6/h.                          Positional AHI: In supine position the AHI was 9.8/h in non-REM supine position, in this  case from sleep, the AHI amounted to 1.1/h.  Snoring was loud above average with a mean volume of 41 dB which accompanied only 17% of total sleep time.  The patient slept equal amounts of time in supine and nonsupine position.                                                  Oxygen Saturation Statistics:  O2 Saturation Range (%): Oxygen saturation varied between and nadir of 85% and a maximum saturation at 98% with a mean oxygen via saturation of 93%.   There was no clinically relevant time in hypoxia.                                     O2 Saturation (minutes) <89%: 0.02          Pulse Rate Statistics  Pulse Range:   Varied between a minimum of 40 bpm and maximum of 89 bpm with a mean heart rate of 50 bpm.              IMPRESSION:  This HST confirms the presence of mild obstructive sleep apnea with a strong REM sleep dependence, and no associated hypoxia. There was also a strong positional component as described above and the patient was and above average loud snorer.  Since snoring continued in nonsupine sleep positions ,I would continue using CPAP for this patient.  Positional therapy alone would resolve his apnea but not his snoring.     RECOMMENDATION:The patient is interested in continuing CPAP use and I will order a large size [employer] Dream[employer]ear nasal cradle interface.  CPAP therapy with an auto-titration device from either [employer] or [employer] with a pressure of 5 through 12 cmH2O with 2 cm EPR.  Heated humidification for machine and tubing.     INTERPRETING PHYSICIAN: Board certified in Neurology and [place], MD  Medical Director of [facility] at [facility].

## 2021-06-02 NOTE — Addendum Note (Signed)
Addended by: Larey Seat on: 06/02/2021 04:41 PM   Modules accepted: Orders

## 2021-06-02 NOTE — Procedures (Signed)
[facility] TEST REPORT ( by Watch PAT)   STUDY DATE:  05-26-2021 DOB:  06/23/1951    ORDERING CLINICIAN: Larey Seat, MD  REFERRING CLINICIAN: Dr [name], MD    CLINICAL INFORMATION/HISTORY: Needs new machine and supplies. 05-11-2021 this meanwhile 70 year-old gentleman uses a CPAP and his wife is happy he doesn't snore. His mask broke and he tried to fix it with duct tape and now he is snoring again. CPAP pressure is set at 7 cm water for over 7 years. The patient underwent a sleep study in 2014 and was referred by Dr. Carolann Littler, MD at that time,07-12-2013 :the patient was diagnosed with an AHI of 37.6/h with moderately to loud snoring, his REM AHI was 67/h and his nonsupine AHI was surprisingly much higher than his AHI in supine sleep position.   Nadir of oxygen was 78% saturation but occurring during supine REM sleep there was 38 minutes of total desaturation time.  CPAP corrected hypoxemia to a total desaturation time of 3.8 minutes, 7 cmH2O allowed for an AHI of 1.8.  Crescendo snoring was much less loud after treatment on CPAP .   He has meanwhile changed to a LARGE size Hardin Negus made [employer].   Epworth sleepiness score: 12 /24.   BMI: 28 kg/m   Neck Circumference: 15"    Sleep Summary:   Total Recording Time (hours, min): A total recording time amounted to 8 hours and 46 minutes of which 7 hours and 40 minutes were calculated total sleep time.  REM sleep was present for 22.7% of total sleep time.                                 Respiratory Indices:   Calculated pAHI (per hour):   The overall AHI was 5.5/h during REM sleep accentuated to 14.5/h and in non-REM sleep 2.8/h.   The RDI or respiratory disturbance index overall was 12.2/h in rem sleep 20.9/h and in non-REM sleep RDI was 9.6/h.                          Positional AHI: In supine position the AHI was 9.8/h in non-REM supine position, in this case from  sleep, the AHI amounted to 1.1/h.  Snoring was loud above average with a mean volume of 41 dB which accompanied only 17% of total sleep time.  The patient slept equal amounts of time in supine and nonsupine position.                                                  Oxygen Saturation Statistics:  O2 Saturation Range (%): Oxygen saturation varied between and nadir of 85% and a maximum saturation at 98% with a mean oxygen via saturation of 93%.   There was no clinically relevant time in hypoxia.                                     O2 Saturation (minutes) <89%: 0.02          Pulse Rate Statistics          Pulse Range:   Varied  between a minimum of 40 bpm and maximum of 89 bpm with a mean heart rate of 50 bpm.              IMPRESSION:  This HST confirms the presence of mild obstructive sleep apnea with a strong REM sleep dependence, and no associated hypoxia. There was also a strong positional component as described above and the patient was and above average loud snorer.  Since snoring continued in nonsupine sleep positions ,I would continue using CPAP for this patient.  Positional therapy alone would resolve his apnea but not his snoring.     RECOMMENDATION:The patient is interested in continuing CPAP use and I will order a large size [employer] Dream[employer]ear nasal cradle interface.  CPAP therapy with an auto-titration device from either [employer] or [employer] with a pressure of 5 through 12 cmH2O with 2 cm EPR.  Heated humidification for machine and tubing.     INTERPRETING PHYSICIAN: Board certified in Neurology and [place], MD  Medical Director of [facility] at [facility].

## 2021-06-02 NOTE — Progress Notes (Signed)
IMPRESSION:  This HST confirms the presence of mild obstructive sleep apnea with a strong REM sleep dependence, and no associated hypoxia. There was also a strong positional component as described above and the patient was and above average loud snorer.  Since snoring continued in nonsupine sleep positions ,I would continue using CPAP for this patient.  Positional therapy alone would resolve his apnea but not his snoring.    RECOMMENDATION:The patient is interested in continuing CPAP use and I will order a large size [employer] Dream[employer]ear nasal cradle interface.  CPAP therapy with an auto-titration device from either [employer] or [employer] with a pressure of 5 through 12 cmH2O with 2 cm EPR.  Heated humidification for machine and tubing. Rv within 90 days of therapy on new machine.

## 2021-06-05 ENCOUNTER — Telehealth: Payer: Self-pay | Admitting: Neurology

## 2021-06-05 NOTE — Telephone Encounter (Signed)
I called pt. I advised pt that Dr. Brett Fairy reviewed their sleep study results and found that pt has mild to moderate osa. Dr. Brett Fairy recommends that pt continues auto CPAP. I reviewed PAP compliance expectations with the pt. Pt is agreeable to starting a CPAP. I advised pt that an order will be sent to a DME, [facility] , and [facility] will call the pt within about one week after they file with the pt's insurance. [facility] ] will show the pt how to use the machine, fit for masks, and troubleshoot the CPAP if needed. A follow up appt was made for insurance purposes with Dr. Brett Fairy on Feb 21,2022 at 2:30 pm. Pt verbalized understanding to arrive 15 minutes early and bring their CPAP. A letter with all of this information in it will be mailed to the pt as a reminder. I verified with the pt that the address we have on file is correct. Pt verbalized understanding of results. Pt had no questions at this time but was encouraged to call back if questions arise. I have sent the order to [facility] and have received confirmation that they have received the order.

## 2021-06-05 NOTE — Telephone Encounter (Signed)
-----   Message from Larey Seat, MD sent at 06/02/2021  4:41 PM EST ----- IMPRESSION:  This HST confirms the presence of mild obstructive sleep apnea with a strong REM sleep dependence, and no associated hypoxia. There was also a strong positional component as described above and the patient was and above average loud snorer.  Since snoring continued in nonsupine sleep positions ,I would continue using CPAP for this patient.  Positional therapy alone would resolve his apnea but not his snoring.    RECOMMENDATION:The patient is interested in continuing CPAP use and I will order a large size [employer] Dream[employer]ear nasal cradle interface.  CPAP therapy with an auto-titration device from either [employer] or [employer] with a pressure of 5 through 12 cmH2O with 2 cm EPR.  Heated humidification for machine and tubing. Rv within 90 days of therapy on new machine.

## 2021-07-11 DIAGNOSIS — M542 Cervicalgia: Secondary | ICD-10-CM | POA: Diagnosis not present

## 2021-08-01 DIAGNOSIS — G459 Transient cerebral ischemic attack, unspecified: Secondary | ICD-10-CM | POA: Diagnosis not present

## 2021-08-01 DIAGNOSIS — G4730 Sleep apnea, unspecified: Secondary | ICD-10-CM | POA: Diagnosis not present

## 2021-08-01 DIAGNOSIS — R0683 Snoring: Secondary | ICD-10-CM | POA: Diagnosis not present

## 2021-08-01 DIAGNOSIS — I10 Essential (primary) hypertension: Secondary | ICD-10-CM | POA: Diagnosis not present

## 2021-08-01 DIAGNOSIS — G4733 Obstructive sleep apnea (adult) (pediatric): Secondary | ICD-10-CM | POA: Diagnosis not present

## 2021-08-01 DIAGNOSIS — R2681 Unsteadiness on feet: Secondary | ICD-10-CM | POA: Diagnosis not present

## 2021-08-01 DIAGNOSIS — G4710 Hypersomnia, unspecified: Secondary | ICD-10-CM | POA: Diagnosis not present

## 2021-08-02 DIAGNOSIS — M542 Cervicalgia: Secondary | ICD-10-CM | POA: Diagnosis not present

## 2021-08-04 DIAGNOSIS — Z125 Encounter for screening for malignant neoplasm of prostate: Secondary | ICD-10-CM | POA: Diagnosis not present

## 2021-08-04 DIAGNOSIS — E291 Testicular hypofunction: Secondary | ICD-10-CM | POA: Diagnosis not present

## 2021-08-04 DIAGNOSIS — I10 Essential (primary) hypertension: Secondary | ICD-10-CM | POA: Diagnosis not present

## 2021-08-04 DIAGNOSIS — E785 Hyperlipidemia, unspecified: Secondary | ICD-10-CM | POA: Diagnosis not present

## 2021-08-09 DIAGNOSIS — M542 Cervicalgia: Secondary | ICD-10-CM | POA: Diagnosis not present

## 2021-08-22 DIAGNOSIS — M542 Cervicalgia: Secondary | ICD-10-CM | POA: Diagnosis not present

## 2021-08-22 DIAGNOSIS — Z1331 Encounter for screening for depression: Secondary | ICD-10-CM | POA: Diagnosis not present

## 2021-08-22 DIAGNOSIS — Z1212 Encounter for screening for malignant neoplasm of rectum: Secondary | ICD-10-CM | POA: Diagnosis not present

## 2021-08-22 DIAGNOSIS — Z1339 Encounter for screening examination for other mental health and behavioral disorders: Secondary | ICD-10-CM | POA: Diagnosis not present

## 2021-08-22 DIAGNOSIS — I2510 Atherosclerotic heart disease of native coronary artery without angina pectoris: Secondary | ICD-10-CM | POA: Diagnosis not present

## 2021-08-22 DIAGNOSIS — M8580 Other specified disorders of bone density and structure, unspecified site: Secondary | ICD-10-CM | POA: Diagnosis not present

## 2021-08-22 DIAGNOSIS — I77810 Thoracic aortic ectasia: Secondary | ICD-10-CM | POA: Diagnosis not present

## 2021-08-22 DIAGNOSIS — Z0000 Encounter for general adult medical examination without abnormal findings: Secondary | ICD-10-CM | POA: Diagnosis not present

## 2021-08-22 DIAGNOSIS — R918 Other nonspecific abnormal finding of lung field: Secondary | ICD-10-CM | POA: Diagnosis not present

## 2021-08-22 DIAGNOSIS — E785 Hyperlipidemia, unspecified: Secondary | ICD-10-CM | POA: Diagnosis not present

## 2021-08-22 DIAGNOSIS — J479 Bronchiectasis, uncomplicated: Secondary | ICD-10-CM | POA: Diagnosis not present

## 2021-08-22 DIAGNOSIS — Z23 Encounter for immunization: Secondary | ICD-10-CM | POA: Diagnosis not present

## 2021-08-22 DIAGNOSIS — R82998 Other abnormal findings in urine: Secondary | ICD-10-CM | POA: Diagnosis not present

## 2021-08-22 DIAGNOSIS — I6389 Other cerebral infarction: Secondary | ICD-10-CM | POA: Diagnosis not present

## 2021-08-22 DIAGNOSIS — I10 Essential (primary) hypertension: Secondary | ICD-10-CM | POA: Diagnosis not present

## 2021-08-25 ENCOUNTER — Other Ambulatory Visit: Payer: Self-pay | Admitting: Internal Medicine

## 2021-08-25 DIAGNOSIS — J479 Bronchiectasis, uncomplicated: Secondary | ICD-10-CM

## 2021-08-28 DIAGNOSIS — G4733 Obstructive sleep apnea (adult) (pediatric): Secondary | ICD-10-CM | POA: Diagnosis not present

## 2021-08-28 DIAGNOSIS — G459 Transient cerebral ischemic attack, unspecified: Secondary | ICD-10-CM | POA: Diagnosis not present

## 2021-08-28 DIAGNOSIS — I10 Essential (primary) hypertension: Secondary | ICD-10-CM | POA: Diagnosis not present

## 2021-08-28 DIAGNOSIS — G4730 Sleep apnea, unspecified: Secondary | ICD-10-CM | POA: Diagnosis not present

## 2021-08-28 DIAGNOSIS — G4710 Hypersomnia, unspecified: Secondary | ICD-10-CM | POA: Diagnosis not present

## 2021-08-30 DIAGNOSIS — M542 Cervicalgia: Secondary | ICD-10-CM | POA: Diagnosis not present

## 2021-09-01 DIAGNOSIS — G4710 Hypersomnia, unspecified: Secondary | ICD-10-CM | POA: Diagnosis not present

## 2021-09-01 DIAGNOSIS — G459 Transient cerebral ischemic attack, unspecified: Secondary | ICD-10-CM | POA: Diagnosis not present

## 2021-09-01 DIAGNOSIS — R2681 Unsteadiness on feet: Secondary | ICD-10-CM | POA: Diagnosis not present

## 2021-09-01 DIAGNOSIS — G4730 Sleep apnea, unspecified: Secondary | ICD-10-CM | POA: Diagnosis not present

## 2021-09-01 DIAGNOSIS — R0683 Snoring: Secondary | ICD-10-CM | POA: Diagnosis not present

## 2021-09-01 DIAGNOSIS — I10 Essential (primary) hypertension: Secondary | ICD-10-CM | POA: Diagnosis not present

## 2021-09-01 DIAGNOSIS — G4733 Obstructive sleep apnea (adult) (pediatric): Secondary | ICD-10-CM | POA: Diagnosis not present

## 2021-09-06 DIAGNOSIS — M542 Cervicalgia: Secondary | ICD-10-CM | POA: Diagnosis not present

## 2021-09-12 ENCOUNTER — Encounter: Payer: Self-pay | Admitting: Neurology

## 2021-09-12 ENCOUNTER — Ambulatory Visit: Payer: Medicare HMO | Admitting: Neurology

## 2021-09-12 VITALS — BP 143/80 | HR 62 | Ht 70.0 in | Wt 191.0 lb

## 2021-09-12 DIAGNOSIS — R2681 Unsteadiness on feet: Secondary | ICD-10-CM

## 2021-09-12 DIAGNOSIS — G4733 Obstructive sleep apnea (adult) (pediatric): Secondary | ICD-10-CM

## 2021-09-12 DIAGNOSIS — Z9989 Dependence on other enabling machines and devices: Secondary | ICD-10-CM | POA: Diagnosis not present

## 2021-09-12 NOTE — Progress Notes (Signed)
[facility]  Provider:  Larey Seat, M D  Referring Provider: [name], MD Primary Care Physician:  [name], MD  Chief Complaint  Patient presents with   Obstructive Sleep Apnea    Rm 11, alone. Here for initial CPAP f/u. Pt reports doing well. No issues or concerns today.     HPI:  Caleb Rogers is a 71 y.o. male  and  seen after he started therapy on a new device, 09-12-2021.   The patient underwent a retesting on 05-26-2021 and until that time he has used a large size [employer] and the patient will continue to use CPAP with an auto titration device.  Pressure settings were located between 5 through 12 cmH2O pressure was 2 cm EPR heated humidification.  The patient was above average loud snoring in his sleep study.  His AHI was only 5.5 so he really had very mild apnea but he had several respiratory arousals or RDI's.  And these interrupted especially in dream sleep at a frequency of 20.9/h.  So we are basically treating a mild sleep apnea and a moderate severe upper respiratory resistance with CPAP at this time.  The patient has been 100% compliant with the new device, he has used the machine 6 hours and 31 minutes nightly on average.  He is using an air sense 11 AutoSet by [employer] with a serial [id number].  Five 2000 m water 2 cm EPR residual AHI is 0.1/h.  95th percentile pressure was 8.8 cm water.  So this is an excellent resolution of her already mild apnea.  Epworth score endorsed at 5/ 24 points.      He was seen here upon referral  from [name] for a new Sleep evaluation. He was not seen for over 3 years and I have personally not seen him in a longer time.   I am seeing on 05-11-2021 this meanwhile 71 year-old  Caucasian, right-handed gentleman presents today upon request of his spouse.  He uses CPAP and his wife is happy he doesn't snore. His mask broke and he tried to fix it with duct tape and he is  snoring again. CPAP pressure of 7 cm water . The patient underwent a sleep study at the time referred by [name], MD and in the sleep study tried to do a split-night protocol.  It was performed on 07-12-2013 the patient was diagnosed with an overall apnea index of 37.6/h was moderately to loud snoring, his REM AHI was 67/h and his nonsupine AHI was surprisingly much higher than his AHI in supine sleep position.  Nadir of oxygen was 78% saturation but occurring during supine REM sleep there was 38 minutes of total desaturation time.  Heart rate was average 64 bpm.  CPAP corrected hypoxemia to a total desaturation time of 3.8 minutes, 7 cmH2O allowed for an AHI of 1.8.  Crescendo snoring was much less loud after treatment on CPAP .   He was initially placed on a nuance medium size nasal pillow mask initially but has meanwhile changed to a new nose mask.   Addendum : the patient brought his mask and headgear to the office after this visit., He uses a LARGE size Hardin Negus made [employer] nasal cradle.    The patient states that he falls asleep very quickly and promptly when going to bed, his regular bedtime as Pan, taken only a couple of minutes to fall asleep. Sleeps through the night except for  one bathroom break at about 4 AM. He noted nor difficulties to reinitiate sleep after this nocturia. He rises at 6.45 , spontaneously waking, no alarm needed.   He shares his bedroom with his wife who is bothered by his snoring.  He sleeps on 2 pillows. Flat bed.  He dreams and recalls dreaming. No enactment of dreams.  He doesn't watch TV or read in bed.  No morning headaches, but  dry mouth , no rhinitis, as long as he uses his CPAP.   No family history of sleep disorders, father took naps ( getting up at 4 AM , neurosurgeon ) died in his 81's.    He naps 2-3 times a month only. POWERNAPPER -10-15 minutes.   - after these Power naps, he feels refreshed and recharged.  He has 1-2 cups of  coffee at 10 AM, drinks hot tea in winter. 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 He is exposed to daylight. Works in and outdoors.  He has been cutting down on work hours as he approached his 32s . No further use of caffeine after morning time, takes power naps about 2-3 days a week.  No shift work history.  Has reduced his wine consumption to 1-2 glasses of white wine daily ( last assessment from 7 years ago) .  Daily exercises pilates, yoga, gym.  He exercises daily , feels that helps him with stress. Attends meditation.   2015- TIA/ CVA: [name] is an 71 y.o. male who admits to drinking 4 glasses of wine daily but does not smoke. Patient went to sleep last night feeling well. He awoke this am and noted he was listing to the right when he was walking. He denies any inner ear infection, fullness, ringing, vertigo, diplopia, dysphagia. He also denies any decreased sensation or weakness. He states he has never had symptoms like this prior.    The obtained brain MRI and MRA brain were overall unremarkable, showed remote R cerebellar infarct, absent signal within R vert.     Review of Systems: Out of a complete 14 system review, the patient complains of only the following symptoms, and all other reviewed systems are negative. Snoring, daytime naps. EDS 1 , FSS zero.   How likely are you to doze in the following situations: 0 = not likely, 1 = slight chance, 2 = moderate chance, 3 = high chance  Sitting and Reading?2 Watching Television? Sitting inactive in a public place (theater or meeting)? Lying down in the afternoon when circumstances permit?2 Sitting and talking to someone? Sitting quietly after lunch without alcohol?1 In a car, while stopped for a few minutes in traffic? As a passenger in a car for an hour without a break?  Total = 5/ 24 on CPAP.   05-11-2021: Now that CPAP hasn't been used for 3 weeks, Epworth is now increased due to interrupted  therapy and back up to 12/ 24.    Social History   Socioeconomic History   Marital status: Married    Spouse name: Vicente Males   Number of children: 2   Years of education: college   Highest education level: Not on file  Occupational History   Occupation: Banker: Building surveyor FOR SELF EMPLOYED  Tobacco Use   Smoking status: Former    Types: Cigarettes    Quit date: 07/23/1968    Years since quitting: 53.1   Smokeless tobacco: Never   Tobacco comments:    pt  quit July 1997  Substance and Sexual Activity   Alcohol use: Yes    Alcohol/week: 3.0 standard drinks    Types: 3 Glasses of wine per week    Comment: SOCIAL   Drug use: No   Sexual activity: Yes    Partners: Female  Other Topics Concern   Not on file  Social History Narrative   Patient is married Vicente Males) and lives at home with his wife.   Patient has two adult children.   Patient is working full-time.   Patient has a college education.   Patient is right- handed.   Patient drinks two cups of coffee daily.   Social Determinants of Health   Financial Resource Strain: Not on file  Food Insecurity: Not on file  Transportation Needs: Not on file  Physical Activity: Not on file  Stress: Not on file  Social Connections: Not on file  Intimate Partner Violence: Not on file    Family History  Problem Relation Age of Onset   Cancer Mother    Congestive Heart Failure Mother    Coronary artery disease Father    Coronary artery disease Brother 60    Past Medical History:  Diagnosis Date   Gait instability 08/2013   GERD (gastroesophageal reflux disease)    Impaired fasting glucose    OSA (obstructive sleep apnea) 02/24/2014   Pure hypercholesterolemia    Sleep apnea     Past Surgical History:  Procedure Laterality Date   COLONOSCOPY     none     TEE WITHOUT CARDIOVERSION N/A 11/11/2014   Procedure: TRANSESOPHAGEAL ECHOCARDIOGRAM (TEE);  Surgeon: Adrian Prows, MD;  Location: [facility] ENDOSCOPY;  Service:  Cardiovascular;  Laterality: N/A;  h/p in file drawer    Current Outpatient Medications  Medication Sig Dispense Refill   aspirin EC 81 MG EC tablet Take 1 tablet (81 mg total) by mouth daily.     B Complex-C (SUPER B COMPLEX PO) Take 0.5 tablets by mouth every other day.     ezetimibe (ZETIA) 10 MG tablet Take 1 tablet (10 mg total) by mouth daily. 30 tablet 0   omeprazole (PRILOSEC) 20 MG capsule Take 20 mg by mouth daily as needed.      omeprazole (PRILOSEC) 40 MG capsule Take 40 mg by mouth daily.     REPATHA SURECLICK 086 MG/ML SOAJ Inject into the skin.     valsartan-hydrochlorothiazide (DIOVAN-HCT) 80-12.5 MG tablet Take 1 tablet by mouth daily.     No current facility-administered medications for this visit.    Allergies as of 09/12/2021   (No Known Allergies)    Vitals: BP (!) 143/80    Pulse 62    Ht 5\' 10"  (1.778 m)    Wt 191 lb (86.6 kg)    BMI 27.41 kg/m  Last Weight:  Wt Readings from Last 1 Encounters:  09/12/21 191 lb (86.6 kg)   Last Height:   Ht Readings from Last 1 Encounters:  09/12/21 5\' 10"  (1.778 m)    Physical exam:  General: The patient is awake, alert and appears not in acute distress. The patient is well groomed. Head: Normocephalic, atraumatic. Neck is supple. Mallampati 2-3, red and  elongated uvula,  no TMJ and no lateral restriction. , neck circumference: 15."  Cardiovascular:  Regular rate and rhythm, without  murmurs or carotid bruit, and without distended neck veins. Respiratory: Lungs are clear to auscultation. Skin:  Without evidence of edema, or rash, facial redness.  Trunk: BMI is 26 ,  patient has normal posture.  Neurologic exam : The patient is awake and alert, oriented to place and time.   Memory subjective described as intact. There is a normal attention span & concentration ability.  Speech is fluent without  dysarthria, dysphonia or aphasia. Mood and affect are appropriate.  Cranial nerves: Pupils are equal and briskly  reactive to light. Funduscopic exam without  evidence of pallor or edema. Extraocular movements  in vertical and horizontal planes intact and without nystagmus. Visual fields by finger perimetry are intact. Hearing to finger rub intact. Facial sensation intact to fine touch. Facial motor strength is symmetric and tongue and uvula move midline.  Motor exam:  Normal tone and normal muscle bulk and symmetric normal strength in all extremities. Symmetric strength.  Tandem gait unfragmented. Romberg testing is normal.  Deep tendon reflexes: in the upper and lower extremities are symmetric and intact.  Babinski maneuver response is downgoing.    Assessment:  After physical and neurologic examination, review of laboratory studies, imaging, neurophysiology testing and pre-existing records, 19 minute assessment is :  0) TIA several years ago but convinced of right upper extremity involvement. Tongue on the right, right big toe-  Has hyperlipidemia, on Repatha.   1) longstanding OSA on CPAP- continue.  2) Balance reportedly improved since last visit, life style adjustments.    Plan:  Treatment plan and additional workup :   1)  the patient brought his mask and headgear to the office after this visit., He uses a LARGE size Hardin Negus made [employer] nasal cradle.  He has used his new CPAP- compliantly, will follow up in 12 months.   2)  fatty liver, drank 2 glasses of wine a day before. He quit drinking and sleeps better, less anxiety, more activity.   3) balance training in Pilates, no need for referral.balance problem can be related to remote cerebellar infarct, but also to age, and some alcohol use. He has improved his balance since last visit !     RV in 12 months before health insurance starts over again.     Larey Seat, MD

## 2021-09-13 DIAGNOSIS — M542 Cervicalgia: Secondary | ICD-10-CM | POA: Diagnosis not present

## 2021-09-20 ENCOUNTER — Ambulatory Visit
Admission: RE | Admit: 2021-09-20 | Discharge: 2021-09-20 | Disposition: A | Discharge status: Home / Self Care | Payer: Medicare HMO | Source: Ambulatory Visit | Attending: Internal Medicine | Admitting: Internal Medicine

## 2021-09-20 ENCOUNTER — Other Ambulatory Visit: Payer: Self-pay

## 2021-09-20 DIAGNOSIS — J479 Bronchiectasis, uncomplicated: Secondary | ICD-10-CM | POA: Diagnosis not present

## 2021-09-25 DIAGNOSIS — G4710 Hypersomnia, unspecified: Secondary | ICD-10-CM | POA: Diagnosis not present

## 2021-09-25 DIAGNOSIS — G4733 Obstructive sleep apnea (adult) (pediatric): Secondary | ICD-10-CM | POA: Diagnosis not present

## 2021-09-25 DIAGNOSIS — I10 Essential (primary) hypertension: Secondary | ICD-10-CM | POA: Diagnosis not present

## 2021-09-25 DIAGNOSIS — G459 Transient cerebral ischemic attack, unspecified: Secondary | ICD-10-CM | POA: Diagnosis not present

## 2021-09-25 DIAGNOSIS — G4730 Sleep apnea, unspecified: Secondary | ICD-10-CM | POA: Diagnosis not present

## 2021-09-29 DIAGNOSIS — G4730 Sleep apnea, unspecified: Secondary | ICD-10-CM | POA: Diagnosis not present

## 2021-09-29 DIAGNOSIS — G459 Transient cerebral ischemic attack, unspecified: Secondary | ICD-10-CM | POA: Diagnosis not present

## 2021-09-29 DIAGNOSIS — G4733 Obstructive sleep apnea (adult) (pediatric): Secondary | ICD-10-CM | POA: Diagnosis not present

## 2021-09-29 DIAGNOSIS — G4710 Hypersomnia, unspecified: Secondary | ICD-10-CM | POA: Diagnosis not present

## 2021-09-29 DIAGNOSIS — R2681 Unsteadiness on feet: Secondary | ICD-10-CM | POA: Diagnosis not present

## 2021-09-29 DIAGNOSIS — R0683 Snoring: Secondary | ICD-10-CM | POA: Diagnosis not present

## 2021-09-29 DIAGNOSIS — I10 Essential (primary) hypertension: Secondary | ICD-10-CM | POA: Diagnosis not present

## 2021-10-25 DIAGNOSIS — M542 Cervicalgia: Secondary | ICD-10-CM | POA: Diagnosis not present

## 2021-10-26 DIAGNOSIS — I10 Essential (primary) hypertension: Secondary | ICD-10-CM | POA: Diagnosis not present

## 2021-10-26 DIAGNOSIS — G4730 Sleep apnea, unspecified: Secondary | ICD-10-CM | POA: Diagnosis not present

## 2021-10-26 DIAGNOSIS — G4733 Obstructive sleep apnea (adult) (pediatric): Secondary | ICD-10-CM | POA: Diagnosis not present

## 2021-10-26 DIAGNOSIS — G459 Transient cerebral ischemic attack, unspecified: Secondary | ICD-10-CM | POA: Diagnosis not present

## 2021-10-26 DIAGNOSIS — G4710 Hypersomnia, unspecified: Secondary | ICD-10-CM | POA: Diagnosis not present

## 2021-10-30 DIAGNOSIS — G4733 Obstructive sleep apnea (adult) (pediatric): Secondary | ICD-10-CM | POA: Diagnosis not present

## 2021-10-30 DIAGNOSIS — I10 Essential (primary) hypertension: Secondary | ICD-10-CM | POA: Diagnosis not present

## 2021-10-30 DIAGNOSIS — G459 Transient cerebral ischemic attack, unspecified: Secondary | ICD-10-CM | POA: Diagnosis not present

## 2021-10-30 DIAGNOSIS — R0683 Snoring: Secondary | ICD-10-CM | POA: Diagnosis not present

## 2021-10-30 DIAGNOSIS — G4710 Hypersomnia, unspecified: Secondary | ICD-10-CM | POA: Diagnosis not present

## 2021-10-30 DIAGNOSIS — R2681 Unsteadiness on feet: Secondary | ICD-10-CM | POA: Diagnosis not present

## 2021-10-30 DIAGNOSIS — G4730 Sleep apnea, unspecified: Secondary | ICD-10-CM | POA: Diagnosis not present

## 2021-11-29 DIAGNOSIS — R2681 Unsteadiness on feet: Secondary | ICD-10-CM | POA: Diagnosis not present

## 2021-11-29 DIAGNOSIS — I10 Essential (primary) hypertension: Secondary | ICD-10-CM | POA: Diagnosis not present

## 2021-11-29 DIAGNOSIS — G4733 Obstructive sleep apnea (adult) (pediatric): Secondary | ICD-10-CM | POA: Diagnosis not present

## 2021-11-29 DIAGNOSIS — G4730 Sleep apnea, unspecified: Secondary | ICD-10-CM | POA: Diagnosis not present

## 2021-11-29 DIAGNOSIS — G4710 Hypersomnia, unspecified: Secondary | ICD-10-CM | POA: Diagnosis not present

## 2021-11-29 DIAGNOSIS — R0683 Snoring: Secondary | ICD-10-CM | POA: Diagnosis not present

## 2021-11-29 DIAGNOSIS — G459 Transient cerebral ischemic attack, unspecified: Secondary | ICD-10-CM | POA: Diagnosis not present

## 2021-12-13 DIAGNOSIS — R0981 Nasal congestion: Secondary | ICD-10-CM | POA: Diagnosis not present

## 2021-12-13 DIAGNOSIS — Z1152 Encounter for screening for COVID-19: Secondary | ICD-10-CM | POA: Diagnosis not present

## 2021-12-13 DIAGNOSIS — R5383 Other fatigue: Secondary | ICD-10-CM | POA: Diagnosis not present

## 2021-12-13 DIAGNOSIS — G4733 Obstructive sleep apnea (adult) (pediatric): Secondary | ICD-10-CM | POA: Diagnosis not present

## 2021-12-13 DIAGNOSIS — J029 Acute pharyngitis, unspecified: Secondary | ICD-10-CM | POA: Diagnosis not present

## 2021-12-13 DIAGNOSIS — J3489 Other specified disorders of nose and nasal sinuses: Secondary | ICD-10-CM | POA: Diagnosis not present

## 2021-12-28 DIAGNOSIS — H5203 Hypermetropia, bilateral: Secondary | ICD-10-CM | POA: Diagnosis not present

## 2021-12-28 DIAGNOSIS — Z0100 Encounter for examination of eyes and vision without abnormal findings: Secondary | ICD-10-CM | POA: Diagnosis not present

## 2021-12-30 DIAGNOSIS — R0683 Snoring: Secondary | ICD-10-CM | POA: Diagnosis not present

## 2021-12-30 DIAGNOSIS — G459 Transient cerebral ischemic attack, unspecified: Secondary | ICD-10-CM | POA: Diagnosis not present

## 2021-12-30 DIAGNOSIS — I10 Essential (primary) hypertension: Secondary | ICD-10-CM | POA: Diagnosis not present

## 2021-12-30 DIAGNOSIS — G4733 Obstructive sleep apnea (adult) (pediatric): Secondary | ICD-10-CM | POA: Diagnosis not present

## 2021-12-30 DIAGNOSIS — R2681 Unsteadiness on feet: Secondary | ICD-10-CM | POA: Diagnosis not present

## 2021-12-30 DIAGNOSIS — G4730 Sleep apnea, unspecified: Secondary | ICD-10-CM | POA: Diagnosis not present

## 2021-12-30 DIAGNOSIS — G4710 Hypersomnia, unspecified: Secondary | ICD-10-CM | POA: Diagnosis not present

## 2022-01-29 DIAGNOSIS — R0683 Snoring: Secondary | ICD-10-CM | POA: Diagnosis not present

## 2022-01-29 DIAGNOSIS — G4730 Sleep apnea, unspecified: Secondary | ICD-10-CM | POA: Diagnosis not present

## 2022-01-29 DIAGNOSIS — G459 Transient cerebral ischemic attack, unspecified: Secondary | ICD-10-CM | POA: Diagnosis not present

## 2022-01-29 DIAGNOSIS — R2681 Unsteadiness on feet: Secondary | ICD-10-CM | POA: Diagnosis not present

## 2022-01-29 DIAGNOSIS — G4733 Obstructive sleep apnea (adult) (pediatric): Secondary | ICD-10-CM | POA: Diagnosis not present

## 2022-01-29 DIAGNOSIS — G4710 Hypersomnia, unspecified: Secondary | ICD-10-CM | POA: Diagnosis not present

## 2022-01-29 DIAGNOSIS — I10 Essential (primary) hypertension: Secondary | ICD-10-CM | POA: Diagnosis not present

## 2022-03-01 DIAGNOSIS — R0683 Snoring: Secondary | ICD-10-CM | POA: Diagnosis not present

## 2022-03-01 DIAGNOSIS — G459 Transient cerebral ischemic attack, unspecified: Secondary | ICD-10-CM | POA: Diagnosis not present

## 2022-03-01 DIAGNOSIS — I10 Essential (primary) hypertension: Secondary | ICD-10-CM | POA: Diagnosis not present

## 2022-03-01 DIAGNOSIS — G4710 Hypersomnia, unspecified: Secondary | ICD-10-CM | POA: Diagnosis not present

## 2022-03-01 DIAGNOSIS — G4733 Obstructive sleep apnea (adult) (pediatric): Secondary | ICD-10-CM | POA: Diagnosis not present

## 2022-03-01 DIAGNOSIS — G4730 Sleep apnea, unspecified: Secondary | ICD-10-CM | POA: Diagnosis not present

## 2022-03-01 DIAGNOSIS — R2681 Unsteadiness on feet: Secondary | ICD-10-CM | POA: Diagnosis not present

## 2022-03-05 DIAGNOSIS — M542 Cervicalgia: Secondary | ICD-10-CM | POA: Diagnosis not present

## 2022-03-07 DIAGNOSIS — M542 Cervicalgia: Secondary | ICD-10-CM | POA: Diagnosis not present

## 2022-03-14 DIAGNOSIS — M542 Cervicalgia: Secondary | ICD-10-CM | POA: Diagnosis not present

## 2022-03-21 DIAGNOSIS — M542 Cervicalgia: Secondary | ICD-10-CM | POA: Diagnosis not present

## 2022-03-28 DIAGNOSIS — L820 Inflamed seborrheic keratosis: Secondary | ICD-10-CM | POA: Diagnosis not present

## 2022-03-28 DIAGNOSIS — M542 Cervicalgia: Secondary | ICD-10-CM | POA: Diagnosis not present

## 2022-03-28 DIAGNOSIS — D225 Melanocytic nevi of trunk: Secondary | ICD-10-CM | POA: Diagnosis not present

## 2022-03-28 DIAGNOSIS — C44319 Basal cell carcinoma of skin of other parts of face: Secondary | ICD-10-CM | POA: Diagnosis not present

## 2022-03-28 DIAGNOSIS — D485 Neoplasm of uncertain behavior of skin: Secondary | ICD-10-CM | POA: Diagnosis not present

## 2022-03-28 DIAGNOSIS — L821 Other seborrheic keratosis: Secondary | ICD-10-CM | POA: Diagnosis not present

## 2022-04-01 DIAGNOSIS — G4733 Obstructive sleep apnea (adult) (pediatric): Secondary | ICD-10-CM | POA: Diagnosis not present

## 2022-04-01 DIAGNOSIS — G459 Transient cerebral ischemic attack, unspecified: Secondary | ICD-10-CM | POA: Diagnosis not present

## 2022-04-01 DIAGNOSIS — G4730 Sleep apnea, unspecified: Secondary | ICD-10-CM | POA: Diagnosis not present

## 2022-04-01 DIAGNOSIS — I10 Essential (primary) hypertension: Secondary | ICD-10-CM | POA: Diagnosis not present

## 2022-04-01 DIAGNOSIS — G4710 Hypersomnia, unspecified: Secondary | ICD-10-CM | POA: Diagnosis not present

## 2022-04-01 DIAGNOSIS — R2681 Unsteadiness on feet: Secondary | ICD-10-CM | POA: Diagnosis not present

## 2022-04-01 DIAGNOSIS — R0683 Snoring: Secondary | ICD-10-CM | POA: Diagnosis not present

## 2022-04-11 DIAGNOSIS — M542 Cervicalgia: Secondary | ICD-10-CM | POA: Diagnosis not present

## 2022-04-30 DIAGNOSIS — C44319 Basal cell carcinoma of skin of other parts of face: Secondary | ICD-10-CM | POA: Diagnosis not present

## 2022-05-01 DIAGNOSIS — I10 Essential (primary) hypertension: Secondary | ICD-10-CM | POA: Diagnosis not present

## 2022-05-01 DIAGNOSIS — G459 Transient cerebral ischemic attack, unspecified: Secondary | ICD-10-CM | POA: Diagnosis not present

## 2022-05-01 DIAGNOSIS — G4733 Obstructive sleep apnea (adult) (pediatric): Secondary | ICD-10-CM | POA: Diagnosis not present

## 2022-05-01 DIAGNOSIS — R2681 Unsteadiness on feet: Secondary | ICD-10-CM | POA: Diagnosis not present

## 2022-05-01 DIAGNOSIS — R0683 Snoring: Secondary | ICD-10-CM | POA: Diagnosis not present

## 2022-05-01 DIAGNOSIS — G4710 Hypersomnia, unspecified: Secondary | ICD-10-CM | POA: Diagnosis not present

## 2022-05-01 DIAGNOSIS — G4730 Sleep apnea, unspecified: Secondary | ICD-10-CM | POA: Diagnosis not present

## 2022-05-02 DIAGNOSIS — M25551 Pain in right hip: Secondary | ICD-10-CM | POA: Diagnosis not present

## 2022-05-02 DIAGNOSIS — M25571 Pain in right ankle and joints of right foot: Secondary | ICD-10-CM | POA: Diagnosis not present

## 2022-05-02 DIAGNOSIS — M1611 Unilateral primary osteoarthritis, right hip: Secondary | ICD-10-CM | POA: Diagnosis not present

## 2022-05-18 ENCOUNTER — Ambulatory Visit (INDEPENDENT_AMBULATORY_CARE_PROVIDER_SITE_OTHER): Payer: Medicare HMO | Admitting: Orthopaedic Surgery

## 2022-05-18 ENCOUNTER — Ambulatory Visit (INDEPENDENT_AMBULATORY_CARE_PROVIDER_SITE_OTHER): Payer: Medicare HMO

## 2022-05-18 ENCOUNTER — Encounter: Payer: Self-pay | Admitting: Orthopaedic Surgery

## 2022-05-18 DIAGNOSIS — M1611 Unilateral primary osteoarthritis, right hip: Secondary | ICD-10-CM | POA: Diagnosis not present

## 2022-05-18 NOTE — Progress Notes (Signed)
Office Visit Note   Patient: Caleb Rogers           Date of Birth: 1950-10-01           MRN: [id number] Visit Date: 05/18/2022              Requested by: [name], MD [address] [place],  [place] [address] PCP: [name], MD   Assessment & Plan: Visit Diagnoses:  1. Primary osteoarthritis of right hip     Plan: Based on findings impression is severe right hip osteoarthritis.  He has complete destruction of the joint space.  He has bone-on-bone.  X-rays reviewed with Caleb Rogers and his wife.  Treatment options were explained in detail.  Risk benefits prognosis of the hip replacement reviewed.  Questions encouraged and answered.  Based on his options he has elected to move forward with a right total hip replacement for sometime in the future.  We will obtain preoperative clearance from Dr. Haynes Kerns.  We look forward to treating Caleb Rogers in the operating theater.  Follow-Up Instructions: No follow-ups on file.   Orders:  Orders Placed This Encounter  Procedures   XR Pelvis 1-2 Views   No orders of the defined types were placed in this encounter.     Procedures: No procedures performed   Clinical Data: No additional findings.   Subjective: Chief Complaint  Patient presents with   Right Hip - Pain    HPI Caleb Rogers is a very pleasant 71 year old gentleman here with his wife for evaluation of chronic right hip pain due to advanced osteoarthritis.  He has had increasing problems over the last 2 years.  He reports deep-seated hip and thigh and groin pain.  He was previously seen by Dr. Delilah Shan at [facility] and was referred to Dr. Lyla Glassing for hip replacement surgery.  He is here to see me to discuss treatment options.  Currently he is still working and owns a Therapist, sports.  He is very active and used to be able to walk 5 to 6 miles a day but is unable to do so now.  He currently treats the pain with ibuprofen and Tylenol which provide temporary relief.   He is not interested in cortisone injections since they are only temporary and treat the symptoms.  Review of Systems  Constitutional: Negative.   All other systems reviewed and are negative.    Objective: Vital Signs: There were no vitals taken for this visit.  Physical Exam Vitals and nursing note reviewed.  Constitutional:      Appearance: He is well-developed.  HENT:     Head: Normocephalic and atraumatic.  Eyes:     Pupils: Pupils are equal, round, and reactive to light.  Pulmonary:     Effort: Pulmonary effort is normal.  Abdominal:     Palpations: Abdomen is soft.  Musculoskeletal:        General: Normal range of motion.     Cervical back: Neck supple.  Skin:    General: Skin is warm.  Neurological:     Mental Status: He is alert and oriented to person, place, and time.  Psychiatric:        Behavior: Behavior normal.        Thought Content: Thought content normal.        Judgment: Judgment normal.     Ortho Exam Examination of the right hip girdle shows 0 degrees of internal rotation.  Pain with logroll.  The skin is intact without  any evidence of infection or dermatitis.  Normal sensation and temperature.  Pain with hip flexion past 90 degrees.  There is no trochanteric tenderness.  There is no lumbar tenderness.  No sciatic tension signs. Specialty Comments:  No specialty comments available.  Imaging: No results found.   PMFS History: Patient Active Problem List   Diagnosis Date Noted   Primary osteoarthritis of right hip 05/18/2022   Loud snoring 05/11/2021   Hypersomnia with sleep apnea 05/11/2021   CPAP (continuous positive airway pressure) dependence 05/11/2021   OSA on CPAP 09/27/2014   OSA (obstructive sleep apnea) 02/24/2014   Gait instability 09/16/2013   Hypertension 09/16/2013   Hyperlipidemia 09/16/2013   TIA (transient ischemic attack) 09/16/2013   Past Medical History:  Diagnosis Date   Gait instability 08/2013   GERD  (gastroesophageal reflux disease)    Impaired fasting glucose    OSA (obstructive sleep apnea) 02/24/2014   Pure hypercholesterolemia    Sleep apnea     Family History  Problem Relation Age of Onset   Cancer Mother    Congestive Heart Failure Mother    Coronary artery disease Father    Coronary artery disease Brother 65    Past Surgical History:  Procedure Laterality Date   COLONOSCOPY     none     TEE WITHOUT CARDIOVERSION N/A 11/11/2014   Procedure: TRANSESOPHAGEAL ECHOCARDIOGRAM (TEE);  Surgeon: Adrian Prows, MD;  Location: [facility] ENDOSCOPY;  Service: Cardiovascular;  Laterality: N/A;  h/p in file drawer   Social History   Occupational History   Occupation: painter    Employer: [employer]  Tobacco Use   Smoking status: Former    Types: Cigarettes    Quit date: 07/23/1968    Years since quitting: 53.8   Smokeless tobacco: Never   Tobacco comments:    pt quit July 1997  Substance and Sexual Activity   Alcohol use: Yes    Alcohol/week: 3.0 standard drinks of alcohol    Types: 3 Glasses of wine per week    Comment: SOCIAL   Drug use: No   Sexual activity: Yes    Partners: Female

## 2022-05-23 ENCOUNTER — Other Ambulatory Visit: Payer: Self-pay

## 2022-06-08 ENCOUNTER — Other Ambulatory Visit: Payer: Self-pay | Admitting: Physician Assistant

## 2022-06-11 NOTE — Pre-Procedure Instructions (Signed)
Surgical Instructions    Your procedure is scheduled on June 20, 2022.  Report to [facility] Main Entrance "A" at 5:30 A.M., then check in with the Admitting office.  Call this number if you have problems the morning of surgery:  [phone]   If you have any questions prior to your surgery date call [phone]: Open Monday-Friday 8am-4pm    Remember:  Do not eat after midnight the night before your surgery  You may drink clear liquids until 4:30 AM the morning of your surgery.   Clear liquids allowed are: Water, Non-Citrus Juices (without pulp), Carbonated Beverages, Clear Tea, Black Coffee Only (NO MILK, CREAM OR POWDERED CREAMER of any kind), and Gatorade.  Patient Instructions  The night before surgery:  No food after midnight. ONLY clear liquids after midnight  The day of surgery (if you do NOT have diabetes):  Drink ONE (1) Pre-Surgery Clear Ensure by 4:30 AM the morning of surgery. Drink in one sitting. Do not sip.  This drink was given to you during your hospital  pre-op appointment visit.  Nothing else to drink after completing the  Pre-Surgery Clear Ensure.         If you have questions, please contact your surgeon's office.      Take these medicines the morning of surgery with A SIP OF WATER:  ezetimibe (ZETIA)   omeprazole (PRILOSEC)    Follow your surgeon's instructions on when to stop Aspirin.  If no instructions were given by your surgeon then you will need to call the office to get those instructions.     As of today, STOP taking any Aleve, Naproxen, Ibuprofen, Motrin, Advil, Goody's, BC's, all herbal medications, fish oil, and all vitamins.                     Do NOT Smoke (Tobacco/Vaping) for 24 hours prior to your procedure.  If you use a CPAP at night, you may bring your mask/headgear for your overnight stay.   Contacts, glasses, piercing's, hearing aid's, dentures or partials may not be worn into surgery, please bring cases for these  belongings.    For patients admitted to the hospital, discharge time will be determined by your treatment team.   Patients discharged the day of surgery will not be allowed to drive home, and someone needs to stay with them for 24 hours.  SURGICAL WAITING ROOM VISITATION Patients having surgery or a procedure may have no more than 2 support people in the waiting area - these visitors may rotate.   Children under the age of 24 must have an adult with them who is not the patient. If the patient needs to stay at the hospital during part of their recovery, the visitor guidelines for inpatient rooms apply. Pre-op nurse will coordinate an appropriate time for 1 support person to accompany patient in pre-op.  This support person may not rotate.   Please refer to the [facility] website for the visitor guidelines for Inpatients (after your surgery is over and you are in a regular room).    Special instructions:   [facility]- Preparing For Surgery  Before surgery, you can play an important role. Because skin is not sterile, your skin needs to be as free of germs as possible. You can reduce the number of germs on your skin by washing with CHG (chlorahexidine gluconate) Soap before surgery.  CHG is an antiseptic cleaner which kills germs and bonds with the skin to continue killing germs  even after washing.    Oral Hygiene is also important to reduce your risk of infection.  Remember - BRUSH YOUR TEETH THE MORNING OF SURGERY WITH YOUR REGULAR TOOTHPASTE  Please do not use if you have an allergy to CHG or antibacterial soaps. If your skin becomes reddened/irritated stop using the CHG.  Do not shave (including legs and underarms) for at least 48 hours prior to first CHG shower. It is OK to shave your face.  Please follow these instructions carefully.   Shower the NIGHT BEFORE SURGERY and the MORNING OF SURGERY  If you chose to wash your hair, wash your hair first as usual with your normal  shampoo.  After you shampoo, rinse your hair and body thoroughly to remove the shampoo.  Use CHG Soap as you would any other liquid soap. You can apply CHG directly to the skin and wash gently with a scrungie or a clean washcloth.   Apply the CHG Soap to your body ONLY FROM THE NECK DOWN.  Do not use on open wounds or open sores. Avoid contact with your eyes, ears, mouth and genitals (private parts). Wash Face and genitals (private parts)  with your normal soap.   Wash thoroughly, paying special attention to the area where your surgery will be performed.  Thoroughly rinse your body with warm water from the neck down.  DO NOT shower/wash with your normal soap after using and rinsing off the CHG Soap.  Pat yourself dry with a CLEAN TOWEL.  Wear CLEAN PAJAMAS to bed the night before surgery  Place CLEAN SHEETS on your bed the night before your surgery  DO NOT SLEEP WITH PETS.   Day of Surgery: Take a shower with CHG soap. Do not wear jewelry or makeup Do not wear lotions, powders, perfumes/colognes, or deodorant. Do not shave 48 hours prior to surgery.  Men may shave face and neck. Do not bring valuables to the hospital.  [facility] is not responsible for any belongings or valuables. Do not wear nail polish, gel polish, artificial nails, or any other type of covering on natural nails (fingers and toes) If you have artificial nails or gel coating that need to be removed by a nail salon, please have this removed prior to surgery. Artificial nails or gel coating may interfere with anesthesia's ability to adequately monitor your vital signs.  Wear Clean/Comfortable clothing the morning of surgery Remember to brush your teeth WITH YOUR REGULAR TOOTHPASTE.   Please read over the following fact sheets that you were given.    If you received a COVID test during your pre-op visit  it is requested that you wear a mask when out in public, stay away from anyone that may not be feeling well  and notify your surgeon if you develop symptoms. If you have been in contact with anyone that has tested positive in the last 10 days please notify you surgeon.

## 2022-06-12 ENCOUNTER — Encounter: Payer: Self-pay | Admitting: Cardiology

## 2022-06-12 ENCOUNTER — Encounter (HOSPITAL_COMMUNITY): Payer: Self-pay

## 2022-06-12 ENCOUNTER — Ambulatory Visit: Payer: Medicare HMO | Admitting: Cardiology

## 2022-06-12 ENCOUNTER — Other Ambulatory Visit: Payer: Self-pay

## 2022-06-12 ENCOUNTER — Encounter (HOSPITAL_COMMUNITY)
Admission: RE | Admit: 2022-06-12 | Discharge: 2022-06-12 | Disposition: A | Discharge status: Home / Self Care | Payer: Medicare HMO | Source: Ambulatory Visit | Attending: Orthopaedic Surgery | Admitting: Orthopaedic Surgery

## 2022-06-12 VITALS — BP 132/92 | HR 55 | Temp 97.4°F | Resp 17 | Ht 70.0 in | Wt 191.5 lb

## 2022-06-12 VITALS — BP 143/84 | HR 54 | Ht 70.0 in | Wt 192.8 lb

## 2022-06-12 DIAGNOSIS — E782 Mixed hyperlipidemia: Secondary | ICD-10-CM

## 2022-06-12 DIAGNOSIS — M1611 Unilateral primary osteoarthritis, right hip: Secondary | ICD-10-CM | POA: Insufficient documentation

## 2022-06-12 DIAGNOSIS — I340 Nonrheumatic mitral (valve) insufficiency: Secondary | ICD-10-CM | POA: Diagnosis not present

## 2022-06-12 DIAGNOSIS — R931 Abnormal findings on diagnostic imaging of heart and coronary circulation: Secondary | ICD-10-CM

## 2022-06-12 DIAGNOSIS — G4733 Obstructive sleep apnea (adult) (pediatric): Secondary | ICD-10-CM | POA: Diagnosis not present

## 2022-06-12 DIAGNOSIS — Z01818 Encounter for other preprocedural examination: Secondary | ICD-10-CM | POA: Diagnosis not present

## 2022-06-12 DIAGNOSIS — I10 Essential (primary) hypertension: Secondary | ICD-10-CM

## 2022-06-12 LAB — TYPE AND SCREEN
ABO/RH(D): O POS
Antibody Screen: NEGATIVE

## 2022-06-12 LAB — COMPREHENSIVE METABOLIC PANEL
ALT: 26 U/L (ref 0–44)
AST: 24 U/L (ref 15–41)
Albumin: 4.1 g/dL (ref 3.5–5.0)
Alkaline Phosphatase: 65 U/L (ref 38–126)
Anion gap: 11 (ref 5–15)
BUN: 16 mg/dL (ref 8–23)
CO2: 25 mmol/L (ref 22–32)
Calcium: 9.3 mg/dL (ref 8.9–10.3)
Chloride: 101 mmol/L (ref 98–111)
Creatinine, Ser: 0.96 mg/dL (ref 0.61–1.24)
GFR, Estimated: >60 mL/min (ref >60)
Glucose, Bld: 101 mg/dL — ABNORMAL HIGH (ref 70–99)
Potassium: 3.8 mmol/L (ref 3.5–5.1)
Sodium: 137 mmol/L (ref 135–145)
Total Bilirubin: 0.6 mg/dL (ref 0.3–1.2)
Total Protein: 7.2 g/dL (ref 6.5–8.1)

## 2022-06-12 LAB — CBC
HCT: 41.4 % (ref 39.0–52.0)
Hemoglobin: 14.6 g/dL (ref 13.0–17.0)
MCH: 30.7 pg (ref 26.0–34.0)
MCHC: 35.3 g/dL (ref 30.0–36.0)
MCV: 87 fL (ref 80.0–100.0)
Platelets: 317 10*3/uL (ref 150–400)
RBC: 4.76 MIL/uL (ref 4.22–5.81)
RDW: 12.8 % (ref 11.5–15.5)
WBC: 6.9 10*3/uL (ref 4.0–10.5)
nRBC: 0 % (ref 0.0–0.2)

## 2022-06-12 LAB — SURGICAL PCR SCREEN
MRSA, PCR: NEGATIVE
Staphylococcus aureus: POSITIVE — AB

## 2022-06-12 MED ORDER — VALSARTAN-HYDROCHLOROTHIAZIDE 160-25 MG PO TABS: 1.0000 | ORAL_TABLET | Freq: Every day | ORAL | 3 refills | Status: DC

## 2022-06-12 NOTE — Progress Notes (Signed)
Primary Physician/Referring:  [name], MD  Patient ID: Real Cona, male    DOB: 25-Oct-1950, 71 y.o.   MRN: [id number]  Chief Complaint  Patient presents with   Medical Clearance    Surgical   Hyperlipidemia   Hypertension   HPI:    Saron Tweed  is a 71 y.o. Caucasian male with a past medical history significant for hypertension, hyperlipidemia, elevated Agatston score of 1897 placing  him at [place] percentile for subjects of the same age, gender, ethnicity, 2 suspected TIAs, the first in February 2015 and the second one February 2016.  MRI at that time revealed chronic small vessel ischemia and small chronic right cerebellar lacunar infarct without any acute infarct in 2015 and repeat MRI revealed no new changes. Carotid stenosis of 40% left ICA. Remote brief former tobacco use in 1970. Had PFO by TEE as well as aortic atherosclerosis, did not have closure as stroke is felt to be atherosclerotic. Family history significant for coronary artery disease.  He presents today for surgical clearance for right total hip replacement.  He was previously seen by Dr. Einar Gip and 2015 but has been lost to follow-up until now.  Overall, he is doing well without any complaints.  He is currently exercising twice daily for approximately 1 to 2 hours.  He follows a healthy low-sodium diet.  He reports compliance with his CPAP.  He does monitor his blood pressure at home and readings are generally 130-140s/80.  He stopped drinking alcohol in January 2023.  He denies chest pain, shortness of breath, palpitations, leg edema, orthopnea, PND, TIA/syncope.  Past Medical History:  Diagnosis Date   Gait instability 08/23/2013   GERD (gastroesophageal reflux disease)    Hypertension    Impaired fasting glucose    OSA (obstructive sleep apnea) 02/24/2014   Pure hypercholesterolemia    Sleep apnea    Past Surgical History:  Procedure Laterality Date   COLONOSCOPY     none     TEE WITHOUT CARDIOVERSION N/A  11/11/2014   Procedure: TRANSESOPHAGEAL ECHOCARDIOGRAM (TEE);  Surgeon: Adrian Prows, MD;  Location: [facility] ENDOSCOPY;  Service: Cardiovascular;  Laterality: N/A;  h/p in file drawer   Family History  Problem Relation Age of Onset   Cancer Mother    Congestive Heart Failure Mother    Coronary artery disease Father    Coronary artery disease Brother 85    Social History   Tobacco Use   Smoking status: Former    Types: Cigarettes    Quit date: 07/23/1968    Years since quitting: 53.9   Smokeless tobacco: Never   Tobacco comments:    pt quit July 1997  Substance Use Topics   Alcohol use: Not on file    Comment: SOCIAL   Marital Status: Married  ROS  Review of Systems  Constitutional: Negative for malaise/fatigue.  Cardiovascular:  Negative for chest pain, claudication, dyspnea on exertion, irregular heartbeat, leg swelling, near-syncope, orthopnea, palpitations, paroxysmal nocturnal dyspnea and syncope.  Gastrointestinal:  Negative for melena.  Neurological:  Negative for dizziness and light-headedness.   Objective  Blood pressure (!) 143/84, pulse (!) 54, height _0  (1.778 m), weight 192 lb 12.8 oz (87.5 kg), SpO2 96 %. Body mass index is 27.66 kg/m.     06/12/2022   11:54 AM 09/12/2021    2:32 PM 01/04/2016    1:07 PM  Vitals with BMI  Height _1  _2  _3   Weight 192 lbs 13 oz 191 lbs 182  lbs 13 oz  BMI 27.66 32.54 98.2  Systolic 641 583 094  Diastolic 84 80 75  Pulse 54 62 53    Physical Exam Neck:     Vascular: No carotid bruit or JVD.  Cardiovascular:     Rate and Rhythm: Regular rhythm. Bradycardia present.     Pulses:          Carotid pulses are 2+ on the right side and 2+ on the left side.      Radial pulses are 2+ on the right side and 2+ on the left side.       Femoral pulses are 2+ on the right side and 2+ on the left side.      Popliteal pulses are 1+ on the right side and 1+ on the left side.       Dorsalis pedis pulses are 2+ on the right side and  2+ on the left side.       Posterior tibial pulses are 2+ on the right side and 2+ on the left side.     Heart sounds: Murmur heard.     High-pitched blowing midsystolic murmur is present with a grade of 3/6 at the apex.     No gallop.  Pulmonary:     Effort: Pulmonary effort is normal.     Breath sounds: Normal breath sounds. No wheezing or rales.  Abdominal:     General: Bowel sounds are normal.     Palpations: Abdomen is soft.     Tenderness: There is no abdominal tenderness.  Musculoskeletal:     Right lower leg: No edema.     Left lower leg: No edema.  Neurological:     Mental Status: He is alert.   Medications and allergies   Allergies  Allergen Reactions   Atorvastatin Other (See Comments)    Other Reaction(s): leg pains   Other Other (See Comments)    Tree & scrubs     Medication list after today's encounter   Current Outpatient Medications:    aspirin EC 81 MG EC tablet, Take 1 tablet (81 mg total) by mouth daily., Disp: , Rfl:    ezetimibe (ZETIA) 10 MG tablet, Take 1 tablet (10 mg total) by mouth daily., Disp: 30 tablet, Rfl: 0   MAGNESIUM PO, Take 1 tablet by mouth daily., Disp: , Rfl:    omeprazole (PRILOSEC) 40 MG capsule, Take 40 mg by mouth daily., Disp: , Rfl:    POTASSIUM PO, Take 1 tablet by mouth daily., Disp: , Rfl:    REPATHA SURECLICK 076 MG/ML SOAJ, Inject 140 mg into the skin every 14 (fourteen) days., Disp: , Rfl:    valsartan-hydrochlorothiazide (DIOVAN HCT) 160-25 MG tablet, Take 1 tablet by mouth daily., Disp: 90 tablet, Rfl: 3  Laboratory examination:   Lab Results  Component Value Date   NA 142 09/16/2013   K 4.1 09/16/2013   CO2 23 09/16/2013   GLUCOSE 111 (H) 09/16/2013   BUN 21 09/16/2013   CREATININE 0.78 09/16/2013   CALCIUM 9.4 09/16/2013   GFRNONAA >90 09/16/2013       Latest Ref Rng & Units 09/16/2013    2:38 PM 09/16/2013    7:30 AM  CMP  Glucose 70 - 99 mg/dL  111   BUN 6 - 23 mg/dL  21   Creatinine 0.50 - 1.35 mg/dL  0.78  0.82   Sodium 137 - 147 mEq/L  142   Potassium 3.7 - 5.3 mEq/L  4.1  Chloride 96 - 112 mEq/L  104   CO2 19 - 32 mEq/L  23   Calcium 8.4 - 10.5 mg/dL  9.4   Total Protein 6.0 - 8.3 g/dL  7.5   Total Bilirubin 0.3 - 1.2 mg/dL  0.3   Alkaline Phos 39 - 117 U/L  84   AST 0 - 37 U/L  31   ALT 0 - 53 U/L  30       Latest Ref Rng & Units 09/16/2013    2:38 PM 09/16/2013    7:30 AM  CBC  WBC 4.0 - 10.5 K/uL 7.3  6.8   Hemoglobin 13.0 - 17.0 g/dL 15.7  14.9   Hematocrit 39.0 - 52.0 % 43.6  41.7   Platelets 150 - 400 K/uL 323  293     Lipid Panel No results for input(s): "CHOL", "TRIG", "LDLCALC", "VLDL", "HDL", "CHOLHDL", "LDLDIRECT" in the last 8760 hours.  HEMOGLOBIN A1C Lab Results  Component Value Date   HGBA1C 5.8 (H) 09/16/2013   MPG 120 (H) 09/16/2013   TSH No results for input(s): "TSH" in the last 8760 hours.  External labs:   08/04/2021: Sodium 138, potassium 4.1, glucose 107, creatinine 0.8, BUN 10, EGFR 95.3 Hemoglobin 14.0, hematocrit 40.8, MCV 84.7, platelets 325 Cholesterol 106, triglycerides 45, HDL 46, LDL 51 A lipoprotein B44 TSH 1.46  Radiology:   MRI Brain 10/07/2014:  1. Mild scattered periventricular and subcortical chronic small vessel ischemic disease. 2. No acute findings. No change from MRI on 09/16/13.  CT cardiac scoring 05/27/2018:  Coronary calcium score is 1897. Bilateral pleural calcifications findings consistent with asbestos exposure. Indeterminate 6 mm nodule in the medial right lower lobe. Aortic atherosclerosis.  Cardiac Studies:   CCS 05/27/2018: FINDINGS: Technical quality: Good.   CORONARY CALCIUM   Total Agatston Score: 1897   MESA database percentile:  15   OTHER FINDINGS:   Cardiovascular: Extensive coronary artery calcium, particularly in the LAD and right coronary artery. Ascending thoracic aorta is mildly ectatic measuring 3.8 cm. Atherosclerotic wall calcifications in the descending thoracic aorta. Normal  caliber of the main pulmonary arteries.  Nuclear stress test   06/30/2018: 1. The patient performed treadmill exercise using Bruce protocol, completing 7:41 minutes. The patient completed an estimated workload of 9.6 METS, reaching 88% of the maximum predicted heart rate. Normal hemodynamic response seen. No stress symptoms reported. Exercise capacity was normal. No ischemic changes seen on stress electrocardiogram. 2. The overall quality of the study is good. There is no evidence of abnormal lung activity. Stress and rest SPECT images demonstrate homogeneous tracer distribution throughout the myocardium. Gated SPECT imaging reveals normal myocardial thickening and wall motion. The left ventricular ejection fraction was calculated 45%, although visually appears normal. 3. Intermediate risk due to reduced LVEF. Recommend correlation with echocardiogram.  Echocardiogram   07/05/2018: 1. Left ventricle cavity is normal in size. Normal global wall motion. Visual EF is 60-65%. 2. Left atrial cavity is mildly dilated. Possible small foramen ovale is present. 3. Mild (Grade I) aortic regurgitation. 4. Moderate (Grade II) mitral regurgitation. 5. Moderate tricuspid regurgitation. Mild pulmonary hypertension with approx. PA syst. pressure of 34 mm of Hg. 6. IVC is dilated with respiratory variation. Findings suggest elevated RA pressure.  EKG:   EKG 06/12/2022: Marked sinus bradycardia at rate of 45 bpm.  Left axis deviation.  Incomplete right bundle branch block.  No evidence of ischemia or underlying injury pattern.  Assessment     ICD-10-CM   1. Pre-operative  clearance  Z01.818 EKG 12-Lead    2. Primary hypertension  Z66 Basic Metabolic Panel (BMET)    3. Elevated coronary artery calcium score  R93.1     4. OSA on CPAP  G47.33     5. Mixed hyperlipidemia  E78.2     6. Nonrheumatic mitral valve regurgitation  I34.0 PCV ECHOCARDIOGRAM COMPLETE       Orders Placed This Encounter   Procedures   Basic Metabolic Panel (BMET)   EKG 12-Lead   PCV ECHOCARDIOGRAM COMPLETE    Standing Status:   Future    Standing Expiration Date:   06/13/2023    Meds ordered this encounter  Medications   valsartan-hydrochlorothiazide (DIOVAN HCT) 160-25 MG tablet    Sig: Take 1 tablet by mouth daily.    Dispense:  90 tablet    Refill:  3    Order Specific Question:   Supervising Provider    Answer:   Adrian Prows [[id number]]    Medications Discontinued During This Encounter  Medication Reason   valsartan-hydrochlorothiazide (DIOVAN-HCT) 80-12.5 MG tablet      Recommendations:   Augusten Lipkin is a 71 y.o.  Caucasian male with a past medical history significant for hypertension, hyperlipidemia, elevated Agatston score of 1897 placing  him at [place] percentile for subjects of the same age, gender, ethnicity, 2 suspected TIAs, the first in February 2015 and the second one February 2016.  MRI at that time revealed chronic small vessel ischemia and small chronic right cerebellar lacunar infarct without any acute infarct in 2015 and repeat MRI revealed no new changes. Carotid stenosis of 40% left ICA. Remote brief former tobacco use in 1970. Had PFO by TEE as well as aortic atherosclerosis, did not have closure as stroke is felt to be atherosclerotic. Family history significant for coronary artery disease.  Pre-operative clearance He is currently scheduled for right total hip replacement on 06/20/2022 with Dr. Erlinda Hong. Overall, low risk for surgery from cardiovascular standpoint.  Primary hypertension Blood pressure is slightly elevated at today's visit.  He does monitor his blood pressure at home and readings are 130-140s/80.  He is currently taking valsartan HCT 80/12.5 mg daily.  Will increase dose to 160/25 mg daily as he would not like to add another medication at this time. Advised patient to continue to monitor home blood pressure readings and keep a log of these readings.  He will notify the  office or PCP if blood pressure remains elevated.  Would consider adding additional antihypertensive agent if BP remains >140/80. We will recheck BMP in 2 weeks. He does exercise twice daily for 1 to 2 hours. He is following a low-sodium diet.  Elevated coronary artery calcium score He had elevated coronary calcium score on 05/27/2018 of 1897 placing him at the 94th percentile. He underwent nuclear stress test on 06/30/2018 that did not show any ischemic changes on stress electrocardiogram.  Stress was intermediate risk due to reduced LVEF of 45%. He had echocardiogram on 07/05/2018 which showed LVEF of 60 to 65% with moderate mitral regurgitation and tricuspid regurgitation. Will repeat echocardiogram to evaluate MR and TR. Can repeat coronary calcium score in 2024 since this will be 5 years if he has cardiac symptoms.  OSA on CPAP He reports compliance with CPAP. Currently followed by Dr. Brett Fairy.  Mixed hyperlipidemia Reviewed labs from January 2023, lipids are under excellent control. Continues on Repatha without issues.  Total time spent with patient was 60 minutes and greater than 50% of that time was  spent in counseling and coordination care with the patient regarding complex decision making and discussion as state above.  Follow-up on an as needed basis.   Ernst Spell, AGNP-C  06/12/2022, 1:08 PM Office: [phone] Pager: [phone]

## 2022-06-12 NOTE — Progress Notes (Addendum)
PCP - Dr. [name] Cardiologist - Dr. Adrian Prows  PPM/ICD - Denies  Chest x-ray - N/A  EKG - 06/12/22 Stress Test - 06/30/18 ECHO - 11/11/14 Cardiac Cath - Denies  Sleep Study - OSA CPAP - Yes  Diabetes: Denies  Blood Thinner Instructions: N/A Aspirin Instructions: Patient will call surgeon's office for instructions.  ERAS Protcol - Yes, PRE-SURGERY Ensure   COVID TEST- No   Anesthesia review: Yes, cardiac hx; clearance 06/12/22  Patient denies shortness of breath, fever, cough and chest pain at PAT appointment   All instructions explained to the patient, with a verbal understanding of the material. Patient agrees to go over the instructions while at home for a better understanding. Patient also instructed to self quarantine after being tested for COVID-19. The opportunity to ask questions was provided.

## 2022-06-13 NOTE — Anesthesia Preprocedure Evaluation (Addendum)
Anesthesia Evaluation  Patient identified by MRN, date of birth, ID band Patient awake    Reviewed: Allergy & Precautions, H&P , NPO status , Patient's Chart, lab work & pertinent test results  Airway Mallampati: II  TM Distance: >3 FB Neck ROM: Full    Dental no notable dental hx.    Pulmonary sleep apnea , former smoker   Pulmonary exam normal breath sounds clear to auscultation       Cardiovascular hypertension, Pt. on medications negative cardio ROS Normal cardiovascular exam Rhythm:Regular Rate:Normal     Neuro/Psych TIA negative psych ROS   GI/Hepatic Neg liver ROS,GERD  ,,  Endo/Other  negative endocrine ROS    Renal/GU negative Renal ROS  negative genitourinary   Musculoskeletal  (+) Arthritis , Osteoarthritis,    Abdominal   Peds negative pediatric ROS (+)  Hematology negative hematology ROS (+)   Anesthesia Other Findings   Reproductive/Obstetrics negative OB ROS                             Anesthesia Physical Anesthesia Plan  ASA: 3  Anesthesia Plan: Spinal   Post-op Pain Management: Minimal or no pain anticipated   Induction: Intravenous  PONV Risk Score and Plan: 1 and Ondansetron and Treatment may vary due to age or medical condition  Airway Management Planned: Simple Face Mask  Additional Equipment:   Intra-op Plan:   Post-operative Plan:   Informed Consent: I have reviewed the patients History and Physical, chart, labs and discussed the procedure including the risks, benefits and alternatives for the proposed anesthesia with the patient or authorized representative who has indicated his/her understanding and acceptance.     Dental advisory given  Plan Discussed with: CRNA  Anesthesia Plan Comments: (PAT note by Karoline Caldwell, PA-C:  Follows with [facility] cardiology for history of hypertension, hyperlipidemia,elevated Agatston score of 1897 placing him  at [place] percentile for subjects of the same age, gender, ethnicity,2 suspected TIAs, the first in February 2015 and the second one February 2016. MRI at that time revealed chronic small vessel ischemia and small chronic right cerebellar lacunar infarct without any acute infarct in 2015 and repeat MRI revealed no new changes. Carotid stenosis of 40% left ICA.Had PFO by TEE as well as aortic atherosclerosis, did not have closure as stroke is felt to be atherosclerotic.Family history significant for coronary artery disease.  Nuclear stress 06/2018 was nonischemic.  Echo 06/2018 showed EF 60 to 65% with moderate mitral regurgitation and tricuspid regurgitation.  Last seen by Ernst Spell, NP on 06/12/2022 for preop evaluation.  Per note, "He is currently scheduled for right total hip replacement on 06/20/2022 with Dr.Xu. Overall, low risk for surgery from cardiovascular standpoint."  OSA on CPAP.  Preop labs reviewed, WNL.  EKG 05/23/2022:Sinus bradycardia.  Left axis deviation.  Rate 56.  Coronary calcium scoring 05/27/2018: FINDINGS: Technical quality: Good.  CORONARY CALCIUM  Total Agatston Score: 1897  MESA database percentile: 88  OTHER FINDINGS:  Cardiovascular: Extensive coronary artery calcium, particularly in the LAD and right coronary artery. Ascending thoracic aorta is mildly ectatic measuring 3.8 cm. Atherosclerotic wall calcifications in the descending thoracic aorta. Normal caliber of the main pulmonary arteries.  Nuclear stress test 06/30/2018: 1. The patient performed treadmill exercise using Bruce protocol, completing 7:41 minutes. The patient completed an estimated workload of 9.6 METS, reaching 88% of the maximum predicted heart rate. Normal hemodynamic response seen. No stress symptoms reported. Exercise capacity was  normal. No ischemic changes seen on stress electrocardiogram. 2. The overall quality of the study is good. There is no evidence of  abnormal lung activity. Stress and rest SPECT images demonstrate homogeneous tracer distribution throughout the myocardium. Gated SPECT imaging reveals normal myocardial thickening and wall motion. The left ventricular ejection fraction was calculated 45%, although visually appears normal. 3. Intermediate risk due to reduced LVEF. Recommend correlation with echocardiogram.  Echocardiogram 07/05/2018: 1. Left ventricle cavity is normal in size. Normal global wall motion. Visual EF is 60-65%. 2. Left atrial cavity is mildly dilated. Possible small foramen ovale is present. 3. Mild (Grade I) aortic regurgitation. 4. Moderate (Grade II) mitral regurgitation. 5. Moderate tricuspid regurgitation. Mild pulmonary hypertension with approx. PA syst. pressure of 34 mm of Hg. 6. IVC is dilated with respiratory variation. Findings suggest elevated RA pressure.   )        Anesthesia Quick Evaluation

## 2022-06-13 NOTE — Progress Notes (Signed)
Anesthesia Chart Review:  Follows with [facility] cardiology for history of hypertension, hyperlipidemia, elevated Agatston score of 1897 placing  him at [place] percentile for subjects of the same age, gender, ethnicity, 2 suspected TIAs, the first in February 2015 and the second one February 2016.  MRI at that time revealed chronic small vessel ischemia and small chronic right cerebellar lacunar infarct without any acute infarct in 2015 and repeat MRI revealed no new changes. Carotid stenosis of 40% left ICA. Had PFO by TEE as well as aortic atherosclerosis, did not have closure as stroke is felt to be atherosclerotic. Family history significant for coronary artery disease.  Nuclear stress 06/2018 was nonischemic.  Echo 06/2018 showed EF 60 to 65% with moderate mitral regurgitation and tricuspid regurgitation.  Last seen by Ernst Spell, NP on 06/12/2022 for preop evaluation.  Per note, "He is currently scheduled for right total hip replacement on 06/20/2022 with Dr. Erlinda Hong. Overall, low risk for surgery from cardiovascular standpoint."  OSA on CPAP.  Preop labs reviewed, WNL.  EKG 05/23/2022:Sinus bradycardia.  Left axis deviation.  Rate 56.  Coronary calcium scoring 05/27/2018: FINDINGS: Technical quality: Good.   CORONARY CALCIUM   Total Agatston Score: 1897   MESA database percentile:  38   OTHER FINDINGS:   Cardiovascular: Extensive coronary artery calcium, particularly in the LAD and right coronary artery. Ascending thoracic aorta is mildly ectatic measuring 3.8 cm. Atherosclerotic wall calcifications in the descending thoracic aorta. Normal caliber of the main pulmonary arteries.   Nuclear stress test   06/30/2018: 1. The patient performed treadmill exercise using Bruce protocol, completing 7:41 minutes. The patient completed an estimated workload of 9.6 METS, reaching 88% of the maximum predicted heart rate. Normal hemodynamic response seen. No stress symptoms reported. Exercise  capacity was normal. No ischemic changes seen on stress electrocardiogram. 2. The overall quality of the study is good. There is no evidence of abnormal lung activity. Stress and rest SPECT images demonstrate homogeneous tracer distribution throughout the myocardium. Gated SPECT imaging reveals normal myocardial thickening and wall motion. The left ventricular ejection fraction was calculated 45%, although visually appears normal. 3. Intermediate risk due to reduced LVEF. Recommend correlation with echocardiogram.   Echocardiogram   07/05/2018: 1. Left ventricle cavity is normal in size. Normal global wall motion. Visual EF is 60-65%. 2. Left atrial cavity is mildly dilated. Possible small foramen ovale is present. 3. Mild (Grade I) aortic regurgitation. 4. Moderate (Grade II) mitral regurgitation. 5. Moderate tricuspid regurgitation. Mild pulmonary hypertension with approx. PA syst. pressure of 34 mm of Hg. 6. IVC is dilated with respiratory variation. Findings suggest elevated RA pressure.   Wynonia Musty [facility]/Anesthesiology Phone [phone] 06/13/2022 3:50 PM

## 2022-06-18 ENCOUNTER — Other Ambulatory Visit: Payer: Self-pay | Admitting: Physician Assistant

## 2022-06-18 MED ORDER — ASPIRIN 81 MG PO TBEC: 81.0000 mg | DELAYED_RELEASE_TABLET | Freq: Two times a day (BID) | ORAL | 0 refills | Status: AC

## 2022-06-18 MED ORDER — ONDANSETRON HCL 4 MG PO TABS: 4.0000 mg | ORAL_TABLET | Freq: Every day | ORAL | 1 refills | Status: DC | PRN

## 2022-06-18 MED ORDER — METHOCARBAMOL 750 MG PO TABS: 750.0000 mg | ORAL_TABLET | Freq: Two times a day (BID) | ORAL | 2 refills | Status: DC | PRN

## 2022-06-18 MED ORDER — DOCUSATE SODIUM 100 MG PO CAPS: 100.0000 mg | ORAL_CAPSULE | Freq: Every day | ORAL | 2 refills | Status: AC | PRN

## 2022-06-18 MED ORDER — OXYCODONE-ACETAMINOPHEN 5-325 MG PO TABS: 1.0000 | ORAL_TABLET | Freq: Four times a day (QID) | ORAL | 0 refills | Status: DC | PRN

## 2022-06-19 ENCOUNTER — Telehealth: Payer: Self-pay | Admitting: *Deleted

## 2022-06-19 MED ORDER — TRANEXAMIC ACID 1000 MG/10ML IV SOLN
2000.0000 mg | INTRAVENOUS | Status: DC
  Filled 2022-06-19: qty 20

## 2022-06-19 NOTE — Telephone Encounter (Signed)
Ortho bundle Pre-op call completed. 

## 2022-06-19 NOTE — Care Plan (Addendum)
[facility] RNCM call to patient to discuss his upcoming Right total hip arthroplasty with Dr. Erlinda Hong on 06/20/22. He is an Ortho bundle patient through [facility] and is agreeable to case management. He lives with his spouse and will have assistance at home after discharge. He has a RW already. Anticipate HHPT will be needed after a short hospital stay. Referral made to [facility] after choice provided. Reviewed post op care instructions. Will continue to follow for needs.

## 2022-06-20 ENCOUNTER — Other Ambulatory Visit: Payer: Self-pay

## 2022-06-20 ENCOUNTER — Ambulatory Visit (HOSPITAL_COMMUNITY): Payer: Medicare HMO

## 2022-06-20 ENCOUNTER — Observation Stay (HOSPITAL_COMMUNITY): Payer: Medicare HMO

## 2022-06-20 ENCOUNTER — Observation Stay (HOSPITAL_COMMUNITY)
Admission: RE | Admit: 2022-06-20 | Discharge: 2022-06-21 | Disposition: A | Discharge status: Home / Self Care | Payer: Medicare HMO | Attending: Orthopaedic Surgery | Admitting: Orthopaedic Surgery

## 2022-06-20 ENCOUNTER — Ambulatory Visit (HOSPITAL_COMMUNITY): Payer: Medicare HMO | Admitting: Physician Assistant

## 2022-06-20 ENCOUNTER — Ambulatory Visit (HOSPITAL_BASED_OUTPATIENT_CLINIC_OR_DEPARTMENT_OTHER): Payer: Medicare HMO | Admitting: Physician Assistant

## 2022-06-20 ENCOUNTER — Encounter (HOSPITAL_COMMUNITY)
Admission: RE | Disposition: A | Discharge status: Home / Self Care | Payer: Self-pay | Source: Home / Self Care | Attending: Orthopaedic Surgery

## 2022-06-20 DIAGNOSIS — M1611 Unilateral primary osteoarthritis, right hip: Principal | ICD-10-CM | POA: Diagnosis present

## 2022-06-20 DIAGNOSIS — Z87891 Personal history of nicotine dependence: Secondary | ICD-10-CM | POA: Insufficient documentation

## 2022-06-20 DIAGNOSIS — M659 Synovitis and tenosynovitis, unspecified: Secondary | ICD-10-CM | POA: Diagnosis not present

## 2022-06-20 DIAGNOSIS — I10 Essential (primary) hypertension: Secondary | ICD-10-CM | POA: Insufficient documentation

## 2022-06-20 DIAGNOSIS — M25451 Effusion, right hip: Secondary | ICD-10-CM

## 2022-06-20 DIAGNOSIS — Z471 Aftercare following joint replacement surgery: Secondary | ICD-10-CM | POA: Diagnosis not present

## 2022-06-20 DIAGNOSIS — Z96641 Presence of right artificial hip joint: Secondary | ICD-10-CM | POA: Diagnosis not present

## 2022-06-20 DIAGNOSIS — M24051 Loose body in right hip: Secondary | ICD-10-CM

## 2022-06-20 DIAGNOSIS — Z79899 Other long term (current) drug therapy: Secondary | ICD-10-CM | POA: Diagnosis not present

## 2022-06-20 DIAGNOSIS — Z7982 Long term (current) use of aspirin: Secondary | ICD-10-CM | POA: Diagnosis not present

## 2022-06-20 DIAGNOSIS — G4730 Sleep apnea, unspecified: Secondary | ICD-10-CM | POA: Diagnosis not present

## 2022-06-20 HISTORY — DX: Transient cerebral ischemic attack, unspecified: G45.9

## 2022-06-20 HISTORY — PX: TOTAL HIP ARTHROPLASTY: SHX124

## 2022-06-20 LAB — ABO/RH: ABO/RH(D): O POS

## 2022-06-20 SURGERY — ARTHROPLASTY, HIP, TOTAL, ANTERIOR APPROACH
Anesthesia: Spinal | Site: Hip | Laterality: Right

## 2022-06-20 MED ORDER — CEFAZOLIN SODIUM-DEXTROSE 2-4 GM/100ML-% IV SOLN
2.0000 g | Freq: Four times a day (QID) | INTRAVENOUS | Status: AC
  Administered 2022-06-20 (×2): 2 g via INTRAVENOUS
  Filled 2022-06-20 (×2): qty 100

## 2022-06-20 MED ORDER — KETOROLAC TROMETHAMINE 15 MG/ML IJ SOLN
7.5000 mg | Freq: Four times a day (QID) | INTRAMUSCULAR | Status: AC
  Administered 2022-06-20 – 2022-06-21 (×4): 7.5 mg via INTRAVENOUS
  Filled 2022-06-20 (×4): qty 1

## 2022-06-20 MED ORDER — ORAL CARE MOUTH RINSE: 15.0000 mL | Freq: Once | OROMUCOSAL | Status: AC

## 2022-06-20 MED ORDER — PROPOFOL 10 MG/ML IV BOLUS
INTRAVENOUS | Status: DC | PRN
  Administered 2022-06-20: 20 mg via INTRAVENOUS

## 2022-06-20 MED ORDER — MAGNESIUM CITRATE PO SOLN: 1.0000 | Freq: Once | ORAL | Status: DC | PRN

## 2022-06-20 MED ORDER — LACTATED RINGERS IV SOLN
INTRAVENOUS | Status: DC
  Administered 2022-06-20: 950 mL via INTRAVENOUS

## 2022-06-20 MED ORDER — ONDANSETRON HCL 4 MG/2ML IJ SOLN: 4.0000 mg | Freq: Four times a day (QID) | INTRAMUSCULAR | Status: DC | PRN

## 2022-06-20 MED ORDER — METHOCARBAMOL 500 MG PO TABS
ORAL_TABLET | ORAL | Status: AC
  Filled 2022-06-20: qty 1

## 2022-06-20 MED ORDER — OXYCODONE HCL ER 10 MG PO T12A: 10.0000 mg | EXTENDED_RELEASE_TABLET | Freq: Two times a day (BID) | ORAL | Status: DC

## 2022-06-20 MED ORDER — PHENOL 1.4 % MT LIQD: 1.0000 | OROMUCOSAL | Status: DC | PRN

## 2022-06-20 MED ORDER — DEXAMETHASONE SODIUM PHOSPHATE 10 MG/ML IJ SOLN
10.0000 mg | Freq: Once | INTRAMUSCULAR | Status: AC
  Administered 2022-06-21: 10 mg via INTRAVENOUS
  Filled 2022-06-20: qty 1

## 2022-06-20 MED ORDER — DOCUSATE SODIUM 100 MG PO CAPS
100.0000 mg | ORAL_CAPSULE | Freq: Two times a day (BID) | ORAL | Status: DC
  Administered 2022-06-20 – 2022-06-21 (×2): 100 mg via ORAL
  Filled 2022-06-20 (×2): qty 1

## 2022-06-20 MED ORDER — FERROUS SULFATE 325 (65 FE) MG PO TABS
325.0000 mg | ORAL_TABLET | Freq: Three times a day (TID) | ORAL | Status: DC
  Administered 2022-06-20 – 2022-06-21 (×4): 325 mg via ORAL
  Filled 2022-06-20 (×3): qty 1

## 2022-06-20 MED ORDER — BUPIVACAINE IN DEXTROSE 0.75-8.25 % IT SOLN
INTRATHECAL | Status: DC | PRN
  Administered 2022-06-20: 2 mL via INTRATHECAL

## 2022-06-20 MED ORDER — ASPIRIN 81 MG PO CHEW
81.0000 mg | CHEWABLE_TABLET | Freq: Two times a day (BID) | ORAL | Status: DC
  Administered 2022-06-20 – 2022-06-21 (×2): 81 mg via ORAL
  Filled 2022-06-20 (×2): qty 1

## 2022-06-20 MED ORDER — TRANEXAMIC ACID 1000 MG/10ML IV SOLN
INTRAVENOUS | Status: DC | PRN
  Administered 2022-06-20: 2000 mg via TOPICAL

## 2022-06-20 MED ORDER — OXYCODONE HCL 5 MG PO TABS
5.0000 mg | ORAL_TABLET | ORAL | Status: DC | PRN
  Administered 2022-06-20: 10 mg via ORAL
  Filled 2022-06-20: qty 2

## 2022-06-20 MED ORDER — HYDROMORPHONE HCL 1 MG/ML IJ SOLN: 0.2500 mg | INTRAMUSCULAR | Status: DC | PRN

## 2022-06-20 MED ORDER — HYDROMORPHONE HCL 1 MG/ML IJ SOLN: 0.5000 mg | INTRAMUSCULAR | Status: DC | PRN

## 2022-06-20 MED ORDER — TRANEXAMIC ACID-NACL 1000-0.7 MG/100ML-% IV SOLN
1000.0000 mg | INTRAVENOUS | Status: AC
  Administered 2022-06-20: 1000 mg via INTRAVENOUS

## 2022-06-20 MED ORDER — BUPIVACAINE-MELOXICAM ER 400-12 MG/14ML IJ SOLN
INTRAMUSCULAR | Status: DC | PRN
  Administered 2022-06-20: 400 mg

## 2022-06-20 MED ORDER — ONDANSETRON HCL 4 MG PO TABS: 4.0000 mg | ORAL_TABLET | Freq: Four times a day (QID) | ORAL | Status: DC | PRN

## 2022-06-20 MED ORDER — OXYCODONE HCL 5 MG PO TABS: 5.0000 mg | ORAL_TABLET | Freq: Once | ORAL | Status: DC | PRN

## 2022-06-20 MED ORDER — ACETAMINOPHEN 500 MG PO TABS
1000.0000 mg | ORAL_TABLET | Freq: Four times a day (QID) | ORAL | Status: AC
  Administered 2022-06-20 – 2022-06-21 (×4): 1000 mg via ORAL
  Filled 2022-06-20 (×4): qty 2

## 2022-06-20 MED ORDER — MIDAZOLAM HCL 2 MG/2ML IJ SOLN
INTRAMUSCULAR | Status: AC
  Filled 2022-06-20: qty 2

## 2022-06-20 MED ORDER — LACTATED RINGERS IV SOLN: INTRAVENOUS | Status: DC

## 2022-06-20 MED ORDER — POVIDONE-IODINE 10 % EX SWAB
2.0000 | Freq: Once | CUTANEOUS | Status: AC
  Administered 2022-06-20: 2 via TOPICAL

## 2022-06-20 MED ORDER — POLYETHYLENE GLYCOL 3350 17 G PO PACK
17.0000 g | PACK | Freq: Every day | ORAL | Status: DC
  Administered 2022-06-21: 17 g via ORAL
  Filled 2022-06-20: qty 1

## 2022-06-20 MED ORDER — VALSARTAN-HYDROCHLOROTHIAZIDE 160-25 MG PO TABS: 1.0000 | ORAL_TABLET | Freq: Every day | ORAL | Status: DC

## 2022-06-20 MED ORDER — 0.9 % SODIUM CHLORIDE (POUR BTL) OPTIME
TOPICAL | Status: DC | PRN
  Administered 2022-06-20: 1000 mL

## 2022-06-20 MED ORDER — DIPHENHYDRAMINE HCL 12.5 MG/5ML PO ELIX: 25.0000 mg | ORAL_SOLUTION | ORAL | Status: DC | PRN

## 2022-06-20 MED ORDER — BUPIVACAINE-MELOXICAM ER 400-12 MG/14ML IJ SOLN
INTRAMUSCULAR | Status: AC
  Filled 2022-06-20: qty 1

## 2022-06-20 MED ORDER — OXYCODONE HCL ER 10 MG PO T12A
10.0000 mg | EXTENDED_RELEASE_TABLET | Freq: Two times a day (BID) | ORAL | Status: DC
  Administered 2022-06-20 (×2): 10 mg via ORAL
  Filled 2022-06-20 (×2): qty 1

## 2022-06-20 MED ORDER — METHOCARBAMOL 500 MG PO TABS
500.0000 mg | ORAL_TABLET | Freq: Four times a day (QID) | ORAL | Status: DC | PRN
  Administered 2022-06-20 – 2022-06-21 (×2): 500 mg via ORAL
  Filled 2022-06-20: qty 1

## 2022-06-20 MED ORDER — VANCOMYCIN HCL 1 G IV SOLR
INTRAVENOUS | Status: DC | PRN
  Administered 2022-06-20: 1000 mg via TOPICAL

## 2022-06-20 MED ORDER — OXYCODONE HCL 5 MG/5ML PO SOLN: 5.0000 mg | Freq: Once | ORAL | Status: DC | PRN

## 2022-06-20 MED ORDER — VANCOMYCIN HCL 1000 MG IV SOLR
INTRAVENOUS | Status: AC
  Filled 2022-06-20: qty 20

## 2022-06-20 MED ORDER — HYDROCHLOROTHIAZIDE 25 MG PO TABS
25.0000 mg | ORAL_TABLET | Freq: Every day | ORAL | Status: DC
  Administered 2022-06-20 – 2022-06-21 (×2): 25 mg via ORAL
  Filled 2022-06-20 (×2): qty 1

## 2022-06-20 MED ORDER — TRANEXAMIC ACID-NACL 1000-0.7 MG/100ML-% IV SOLN
1000.0000 mg | Freq: Once | INTRAVENOUS | Status: AC
  Administered 2022-06-20: 1000 mg via INTRAVENOUS
  Filled 2022-06-20: qty 100

## 2022-06-20 MED ORDER — PHENYLEPHRINE HCL-NACL 20-0.9 MG/250ML-% IV SOLN
INTRAVENOUS | Status: DC | PRN
  Administered 2022-06-20: 50 ug/min via INTRAVENOUS

## 2022-06-20 MED ORDER — PANTOPRAZOLE SODIUM 40 MG PO TBEC
40.0000 mg | DELAYED_RELEASE_TABLET | Freq: Every day | ORAL | Status: DC
  Administered 2022-06-21: 40 mg via ORAL
  Filled 2022-06-20: qty 1

## 2022-06-20 MED ORDER — METOCLOPRAMIDE HCL 5 MG PO TABS: 5.0000 mg | ORAL_TABLET | Freq: Three times a day (TID) | ORAL | Status: DC | PRN

## 2022-06-20 MED ORDER — PROMETHAZINE HCL 25 MG/ML IJ SOLN: 6.2500 mg | INTRAMUSCULAR | Status: DC | PRN

## 2022-06-20 MED ORDER — MENTHOL 3 MG MT LOZG: 1.0000 | LOZENGE | OROMUCOSAL | Status: DC | PRN

## 2022-06-20 MED ORDER — SODIUM CHLORIDE 0.9 % IR SOLN
Status: DC | PRN
  Administered 2022-06-20: 1000 mL

## 2022-06-20 MED ORDER — ONDANSETRON HCL 4 MG/2ML IJ SOLN
INTRAMUSCULAR | Status: DC | PRN
  Administered 2022-06-20: 4 mg via INTRAVENOUS

## 2022-06-20 MED ORDER — IRBESARTAN 150 MG PO TABS
150.0000 mg | ORAL_TABLET | Freq: Every day | ORAL | Status: DC
  Administered 2022-06-20 – 2022-06-21 (×2): 150 mg via ORAL
  Filled 2022-06-20 (×2): qty 1

## 2022-06-20 MED ORDER — SORBITOL 70 % SOLN: 30.0000 mL | Freq: Every day | Status: DC | PRN

## 2022-06-20 MED ORDER — METOCLOPRAMIDE HCL 5 MG/ML IJ SOLN: 5.0000 mg | Freq: Three times a day (TID) | INTRAMUSCULAR | Status: DC | PRN

## 2022-06-20 MED ORDER — PROPOFOL 500 MG/50ML IV EMUL
INTRAVENOUS | Status: DC | PRN
  Administered 2022-06-20: 100 ug/kg/min via INTRAVENOUS

## 2022-06-20 MED ORDER — CHLORHEXIDINE GLUCONATE 0.12 % MT SOLN
15.0000 mL | Freq: Once | OROMUCOSAL | Status: AC
  Administered 2022-06-20: 15 mL via OROMUCOSAL

## 2022-06-20 MED ORDER — PRONTOSAN WOUND IRRIGATION OPTIME
TOPICAL | Status: DC | PRN
  Administered 2022-06-20: 1

## 2022-06-20 MED ORDER — ACETAMINOPHEN 325 MG PO TABS: 325.0000 mg | ORAL_TABLET | Freq: Four times a day (QID) | ORAL | Status: DC | PRN

## 2022-06-20 MED ORDER — CEFAZOLIN SODIUM-DEXTROSE 2-4 GM/100ML-% IV SOLN
INTRAVENOUS | Status: AC
  Filled 2022-06-20: qty 100

## 2022-06-20 MED ORDER — ALUM & MAG HYDROXIDE-SIMETH 200-200-20 MG/5ML PO SUSP: 30.0000 mL | ORAL | Status: DC | PRN

## 2022-06-20 MED ORDER — CEFAZOLIN SODIUM-DEXTROSE 2-4 GM/100ML-% IV SOLN
2.0000 g | INTRAVENOUS | Status: AC
  Administered 2022-06-20: 3 g via INTRAVENOUS

## 2022-06-20 MED ORDER — METHOCARBAMOL 1000 MG/10ML IJ SOLN: 500.0000 mg | Freq: Four times a day (QID) | INTRAVENOUS | Status: DC | PRN

## 2022-06-20 MED ORDER — OXYCODONE HCL 5 MG PO TABS
10.0000 mg | ORAL_TABLET | ORAL | Status: DC | PRN
  Administered 2022-06-20: 10 mg via ORAL

## 2022-06-20 MED ORDER — SODIUM CHLORIDE 0.9 % IV SOLN: INTRAVENOUS | Status: DC

## 2022-06-20 MED ORDER — OXYCODONE HCL 5 MG PO TABS
ORAL_TABLET | ORAL | Status: AC
  Filled 2022-06-20: qty 2

## 2022-06-20 MED ORDER — TRANEXAMIC ACID-NACL 1000-0.7 MG/100ML-% IV SOLN
INTRAVENOUS | Status: AC
  Filled 2022-06-20: qty 100

## 2022-06-20 MED ORDER — MIDAZOLAM HCL 2 MG/2ML IJ SOLN
INTRAMUSCULAR | Status: DC | PRN
  Administered 2022-06-20: 2 mg via INTRAVENOUS

## 2022-06-20 SURGICAL SUPPLY — 72 items
ACETAB CUP W/GRIPTION 54 (Plate) ×1 IMPLANT
BAG COUNTER SPONGE SURGICOUNT (BAG) ×1 IMPLANT
BAG DECANTER FOR FLEXI CONT (MISCELLANEOUS) ×1 IMPLANT
BAG SPNG CNTER NS LX DISP (BAG) ×1
BLADE SAW SAG 90X13X1.27 (BLADE) IMPLANT
CELLS DAT CNTRL 66122 CELL SVR (MISCELLANEOUS) IMPLANT
CLSR STERI-STRIP ANTIMIC 1/2X4 (GAUZE/BANDAGES/DRESSINGS) IMPLANT
COOLER ICEMAN CLASSIC (MISCELLANEOUS) IMPLANT
COVER PERINEAL POST (MISCELLANEOUS) ×1 IMPLANT
COVER SURGICAL LIGHT HANDLE (MISCELLANEOUS) ×1 IMPLANT
CUP ACETAB W/GRIPTION 54 (Plate) IMPLANT
DRAPE C-ARM 42X72 X-RAY (DRAPES) ×1 IMPLANT
DRAPE POUCH INSTRU U-SHP 10X18 (DRAPES) ×1 IMPLANT
DRAPE STERI IOBAN 125X83 (DRAPES) ×1 IMPLANT
DRAPE U-SHAPE 47X51 STRL (DRAPES) ×2 IMPLANT
DRSG AQUACEL AG ADV 3.5X10 (GAUZE/BANDAGES/DRESSINGS) ×1 IMPLANT
DURAPREP 26ML APPLICATOR (WOUND CARE) ×2 IMPLANT
ELECT BLADE 4.0 EZ CLEAN MEGAD (MISCELLANEOUS) ×1
ELECT REM PT RETURN 9FT ADLT (ELECTROSURGICAL) ×1
ELECTRODE BLDE 4.0 EZ CLN MEGD (MISCELLANEOUS) ×1 IMPLANT
ELECTRODE REM PT RTRN 9FT ADLT (ELECTROSURGICAL) ×1 IMPLANT
GLOVE BIOGEL PI IND STRL 7.0 (GLOVE) ×2 IMPLANT
GLOVE BIOGEL PI IND STRL 7.5 (GLOVE) ×5 IMPLANT
GLOVE ECLIPSE 7.0 STRL STRAW (GLOVE) ×2 IMPLANT
GLOVE SKINSENSE STRL SZ7.5 (GLOVE) ×1 IMPLANT
GLOVE SURG SYN 7.5  E (GLOVE) ×2
GLOVE SURG SYN 7.5 E (GLOVE) ×2 IMPLANT
GLOVE SURG SYN 7.5 PF PI (GLOVE) ×2 IMPLANT
GLOVE SURG UNDER POLY LF SZ7 (GLOVE) ×1 IMPLANT
GLOVE SURG UNDER POLY LF SZ7.5 (GLOVE) ×2 IMPLANT
GOWN STRL REIN XL XLG (GOWN DISPOSABLE) ×1 IMPLANT
GOWN STRL REUS W/ TWL LRG LVL3 (GOWN DISPOSABLE) IMPLANT
GOWN STRL REUS W/ TWL XL LVL3 (GOWN DISPOSABLE) ×1 IMPLANT
GOWN STRL REUS W/TWL LRG LVL3 (GOWN DISPOSABLE)
GOWN STRL REUS W/TWL XL LVL3 (GOWN DISPOSABLE) ×1
HANDPIECE INTERPULSE COAX TIP (DISPOSABLE) ×1
HEAD CERAMIC 36 PLUS5 (Hips) IMPLANT
HOOD PEEL AWAY FLYTE STAYCOOL (MISCELLANEOUS) ×2 IMPLANT
IV NS IRRIG 3000ML ARTHROMATIC (IV SOLUTION) ×1 IMPLANT
JET LAVAGE IRRISEPT WOUND (IRRIGATION / IRRIGATOR)
KIT BASIN OR (CUSTOM PROCEDURE TRAY) ×1 IMPLANT
LAVAGE JET IRRISEPT WOUND (IRRIGATION / IRRIGATOR) ×1 IMPLANT
LINER NEUTRAL 54X36MM PLUS 4 (Hips) IMPLANT
MARKER SKIN DUAL TIP RULER LAB (MISCELLANEOUS) ×1 IMPLANT
NDL SPNL 18GX3.5 QUINCKE PK (NEEDLE) ×1 IMPLANT
NEEDLE SPNL 18GX3.5 QUINCKE PK (NEEDLE) ×1 IMPLANT
PACK TOTAL JOINT (CUSTOM PROCEDURE TRAY) ×1 IMPLANT
PACK UNIVERSAL I (CUSTOM PROCEDURE TRAY) ×1 IMPLANT
PAD COLD SHLDR WRAP-ON (PAD) IMPLANT
RETRACTOR WND ALEXIS 18 MED (MISCELLANEOUS) IMPLANT
RTRCTR WOUND ALEXIS 18CM MED (MISCELLANEOUS)
SAW OSC TIP CART 19.5X105X1.3 (SAW) ×1 IMPLANT
SCREW 6.5MMX30MM (Screw) IMPLANT
SET HNDPC FAN SPRY TIP SCT (DISPOSABLE) ×1 IMPLANT
SOLUTION PRONTOSAN WOUND 350ML (IRRIGATION / IRRIGATOR) IMPLANT
STAPLER VISISTAT 35W (STAPLE) IMPLANT
STEM FEMORAL SZ 6MM STD ACTIS (Stem) IMPLANT
SUT ETHIBOND 2 V 37 (SUTURE) ×1 IMPLANT
SUT MNCRL AB 3-0 PS2 27 (SUTURE) IMPLANT
SUT VIC AB 0 CT1 27 (SUTURE) ×1
SUT VIC AB 0 CT1 27XBRD ANBCTR (SUTURE) ×1 IMPLANT
SUT VIC AB 1 CTX 36 (SUTURE) ×1
SUT VIC AB 1 CTX36XBRD ANBCTR (SUTURE) ×1 IMPLANT
SUT VIC AB 2-0 CT1 27 (SUTURE) ×3
SUT VIC AB 2-0 CT1 TAPERPNT 27 (SUTURE) ×2 IMPLANT
SYR 50ML LL SCALE MARK (SYRINGE) ×1 IMPLANT
TOWEL GREEN STERILE (TOWEL DISPOSABLE) ×1 IMPLANT
TRAY CATH 16FR W/PLASTIC CATH (SET/KITS/TRAYS/PACK) IMPLANT
TRAY FOLEY W/BAG SLVR 16FR (SET/KITS/TRAYS/PACK) ×1
TRAY FOLEY W/BAG SLVR 16FR ST (SET/KITS/TRAYS/PACK) ×1 IMPLANT
TUBE SUCT ARGYLE STRL (TUBING) IMPLANT
YANKAUER SUCT BULB TIP NO VENT (SUCTIONS) ×1 IMPLANT

## 2022-06-20 NOTE — Discharge Instructions (Signed)

## 2022-06-20 NOTE — H&P (Signed)
PREOPERATIVE H&P  Chief Complaint: right hip osteoarthritis  HPI: Caleb Rogers is a 71 y.o. male who presents for surgical treatment of right hip osteoarthritis.  He denies any changes in medical history.  Past Medical History:  Diagnosis Date   Gait instability 08/23/2013   GERD (gastroesophageal reflux disease)    Hypertension    Impaired fasting glucose    OSA (obstructive sleep apnea) 02/24/2014   Pure hypercholesterolemia    Sleep apnea    Past Surgical History:  Procedure Laterality Date   COLONOSCOPY     none     TEE WITHOUT CARDIOVERSION N/A 11/11/2014   Procedure: TRANSESOPHAGEAL ECHOCARDIOGRAM (TEE);  Surgeon: Adrian Prows, MD;  Location: [facility] ENDOSCOPY;  Service: Cardiovascular;  Laterality: N/A;  h/p in file drawer   Social History   Socioeconomic History   Marital status: Married    Spouse name: Vicente Males   Number of children: 2   Years of education: college   Highest education level: Not on file  Occupational History   Occupation: Banker: Building surveyor FOR SELF EMPLOYED  Tobacco Use   Smoking status: Former    Types: Cigarettes    Quit date: 07/23/1968    Years since quitting: 53.9   Smokeless tobacco: Never   Tobacco comments:    pt quit July 1997  Vaping Use   Vaping Use: Never used  Substance and Sexual Activity   Alcohol use: Not Currently   Drug use: No   Sexual activity: Yes    Partners: Female  Other Topics Concern   Not on file  Social History Narrative   Patient is married Vicente Males) and lives at home with his wife.   Patient has two adult children.   Patient is working full-time.   Patient has a college education.   Patient is right- handed.   Patient drinks two cups of coffee daily.   Social Determinants of Health   Financial Resource Strain: Not on file  Food Insecurity: Not on file  Transportation Needs: Not on file  Physical Activity: Not on file  Stress: Not on file  Social Connections: Not on file   Family History   Problem Relation Age of Onset   Cancer Mother    Congestive Heart Failure Mother    Coronary artery disease Father    Coronary artery disease Brother 68   Allergies  Allergen Reactions   Atorvastatin Other (See Comments)    Other Reaction(s): leg pains   Other Other (See Comments)    Tree & scrubs   Prior to Admission medications   Medication Sig Start Date End Date Taking? Authorizing Provider  aspirin EC 81 MG EC tablet Take 1 tablet (81 mg total) by mouth daily. 09/17/13  Yes Geradine Girt, DO  ezetimibe (ZETIA) 10 MG tablet Take 1 tablet (10 mg total) by mouth daily. 05/11/21  Yes Dohmeier, Asencion Partridge, MD  MAGNESIUM PO Take 1 tablet by mouth daily.   Yes [provider]  omeprazole (PRILOSEC) 40 MG capsule Take 40 mg by mouth daily. 08/07/21  Yes [provider]  POTASSIUM PO Take 1 tablet by mouth daily.   Yes [provider]  REPATHA SURECLICK 716 MG/ML SOAJ Inject 140 mg into the skin every 14 (fourteen) days. 05/05/21  Yes [provider]  aspirin EC 81 MG tablet Take 1 tablet (81 mg total) by mouth 2 (two) times daily. To be taken after surgery to prevent blood clots 06/18/22 06/18/23  Venida Jarvis,  Mary L, PA-C  docusate sodium (COLACE) 100 MG capsule Take 1 capsule (100 mg total) by mouth daily as needed. 06/18/22 06/18/23  Aundra Dubin, PA-C  methocarbamol (ROBAXIN-750) 750 MG tablet Take 1 tablet (750 mg total) by mouth 2 (two) times daily as needed for muscle spasms. 06/18/22   Aundra Dubin, PA-C  ondansetron (ZOFRAN) 4 MG tablet Take 1 tablet (4 mg total) by mouth daily as needed for nausea or vomiting. 06/18/22 06/18/23  Aundra Dubin, PA-C  oxyCODONE-acetaminophen (PERCOCET) 5-325 MG tablet Take 1-2 tablets by mouth every 6 (six) hours as needed. To be taken after surgery 06/18/22   Aundra Dubin, PA-C  valsartan-hydrochlorothiazide (DIOVAN HCT) 160-25 MG tablet Take 1 tablet by mouth daily. 06/12/22   Ernst Spell, NP      Positive ROS: All other systems have been reviewed and were otherwise negative with the exception of those mentioned in the HPI and as above.  Physical Exam: General: Alert, no acute distress Cardiovascular: No pedal edema Respiratory: No cyanosis, no use of accessory musculature GI: abdomen soft Skin: No lesions in the area of chief complaint Neurologic: Sensation intact distally Psychiatric: Patient is competent for consent with normal mood and affect Lymphatic: no lymphedema  MUSCULOSKELETAL: exam stable  Assessment: right hip osteoarthritis  Plan: Plan for Procedure(s): RIGHT TOTAL HIP ARTHROPLASTY ANTERIOR APPROACH  The risks benefits and alternatives were discussed with the patient including but not limited to the risks of nonoperative treatment, versus surgical intervention including infection, bleeding, nerve injury,  blood clots, cardiopulmonary complications, morbidity, mortality, among others, and they were willing to proceed.   Eduard Roux, MD 06/20/2022 5:55 AM

## 2022-06-20 NOTE — Evaluation (Signed)
Physical Therapy Evaluation Patient Details Name: Caleb Rogers MRN: [id number] DOB: July 19, 1951 Today's Date: 06/20/2022  History of Present Illness  Pt is 71 yo male s/p R anterior THA on 06/20/22.  Pt with hx of GERD, HTN, OSA  Clinical Impression  Pt is s/p THA resulting in the deficits listed below (see PT Problem List). Pt is normally independent and active.  He has DME at home and home support.  Today, pt did well for DOS.  He was min A transfers and ambulated 66' with RW and good pain control.  Expected to progress well with PT.  Pt will benefit from skilled PT to increase their independence and safety with mobility to allow discharge to the venue listed below.         Recommendations for follow up therapy are one component of a multi-disciplinary discharge planning process, led by the attending physician.  Recommendations may be updated based on patient status, additional functional criteria and insurance authorization.  Follow Up Recommendations Follow physician's recommendations for discharge plan and follow up therapies      Assistance Recommended at Discharge Intermittent Supervision/Assistance  Patient can return home with the following  A little help with walking and/or transfers;A little help with bathing/dressing/bathroom;Assistance with cooking/housework;Help with stairs or ramp for entrance    Equipment Recommendations None recommended by PT  Recommendations for Other Services       Functional Status Assessment Patient has had a recent decline in their functional status and demonstrates the ability to make significant improvements in function in a reasonable and predictable amount of time.     Precautions / Restrictions Precautions Precautions: Fall      Mobility  Bed Mobility Overal bed mobility: Needs Assistance Bed Mobility: Supine to Sit, Sit to Supine     Supine to sit: Min assist Sit to supine: Min assist   General bed mobility comments: cues and  light min A for  R LE    Transfers Overall transfer level: Needs assistance Equipment used: Rolling walker (2 wheels) Transfers: Sit to/from Stand Sit to Stand: Min guard           General transfer comment: cues for hand placement and R LE managment    Ambulation/Gait Ambulation/Gait assistance: Min guard Gait Distance (Feet): 80 Feet Assistive device: Rolling walker (2 wheels) Gait Pattern/deviations: Step-through pattern, Decreased stride length Gait velocity: decreased     General Gait Details: initial cues for sequencing and RW proximity; min guard for safety  Stairs            Wheelchair Mobility    Modified Rankin (Stroke Patients Only)       Balance Overall balance assessment: Needs assistance Sitting-balance support: No upper extremity supported Sitting balance-Leahy Scale: Good     Standing balance support: Bilateral upper extremity supported, No upper extremity supported Standing balance-Leahy Scale: Fair Standing balance comment: RW to ambulate and steady but could static stand without support                             Pertinent Vitals/Pain Pain Assessment Pain Assessment: 0-10 Pain Score: 4  Pain Location: R hip Pain Descriptors / Indicators: Sore Pain Intervention(s): Limited activity within patient's tolerance, Premedicated before session, Monitored during session, Repositioned    Home Living Family/patient expects to be discharged to:: Private residence Living Arrangements: Spouse/significant other Available Help at Discharge: Family;Available 24 hours/day (also has a nurse coming in to help) Type of Home:  House Home Access: Stairs to enter   [employer] of Steps: 2 from sidewalk with bil rails can reach both; 4 into house bil rails but too far apart   [place]: One [place]: Conservation officer, nature (2 wheels);Cane - single point;Wheelchair - manual;Toilet riser      Prior Function Prior Level of  Function : Independent/Modified Independent             Mobility Comments: Hydrographic surveyor; likes to work out ADLs Comments: independent with all     Journalist, newspaper        Extremity/Trunk Assessment   Upper Extremity Assessment Upper Extremity Assessment: Overall WFL for tasks assessed    Lower Extremity Assessment Lower Extremity Assessment: RLE deficits/detail RLE Deficits / Details: Expected post op changes; ROM WFL; MMT: ankle 5/5, kne ext 3/5, hip 2/5    Cervical / Trunk Assessment Cervical / Trunk Assessment: Normal  Communication   Communication: No difficulties  Cognition Arousal/Alertness: Awake/alert Behavior During Therapy: WFL for tasks assessed/performed Overall Cognitive Status: Within Functional Limits for tasks assessed                                          General Comments General comments (skin integrity, edema, etc.): VSS    Exercises     Assessment/Plan    PT Assessment Patient needs continued PT services  PT Problem List Decreased strength;Decreased coordination;Decreased range of motion;Decreased activity tolerance;Decreased knowledge of use of DME;Decreased balance;Decreased mobility;Decreased knowledge of precautions;Decreased safety awareness       PT Treatment Interventions DME instruction;Therapeutic exercise;Gait training;Balance training;Stair training;Functional mobility training;Therapeutic activities;Patient/family education;Modalities    PT Goals (Current goals can be found in the Care Plan section)  Acute Rehab PT Goals Patient Stated Goal: return home PT Goal Formulation: With patient/family Time For Goal Achievement: 07/04/22 Potential to Achieve Goals: Good    Frequency 7X/week     Co-evaluation               AM-PAC PT "6 Clicks" Mobility  Outcome Measure Help needed turning from your back to your side while in a flat bed without using bedrails?: None Help needed moving from lying on  your back to sitting on the side of a flat bed without using bedrails?: A Little Help needed moving to and from a bed to a chair (including a wheelchair)?: A Little Help needed standing up from a chair using your arms (e.g., wheelchair or bedside chair)?: A Little Help needed to walk in hospital room?: A Little Help needed climbing 3-5 steps with a railing? : A Little 6 Click Score: 19    End of Session Equipment Utilized During Treatment: Gait belt Activity Tolerance: Patient tolerated treatment well Patient left: in bed;with call bell/phone within reach;with bed alarm set;with SCD's reapplied Nurse Communication: Mobility status PT Visit Diagnosis: Other abnormalities of gait and mobility (R26.89);Muscle weakness (generalized) (M62.81)    Time: 1610-9604 PT Time Calculation (min) (ACUTE ONLY): 33 min   Charges:   PT Evaluation $PT Eval Low Complexity: 1 Low PT Treatments $Gait Training: 8-22 mins        Abran Richard, PT Acute Rehab [facility] [place] 06/20/2022, 3:32 PM

## 2022-06-20 NOTE — Op Note (Signed)
RIGHT TOTAL HIP ARTHROPLASTY ANTERIOR APPROACH  Procedure Note Quientin Jent   [id number]  Pre-op Diagnosis: right hip osteoarthritis     Post-op Diagnosis: same  Operative Findings Complete loss of articular cartilage Loose body Joint synovitis and effusion Clinical leg length discrepancy   Operative Procedures  1. Total hip replacement; Right hip; uncemented [id number]   Surgeon: Frankey Shown, M.D.  Assist: Madalyn Rob, PA-C   Anesthesia: spinal  Prosthesis: Depuy Acetabulum: Pinnacle 54 mm Femur: Actis 6 STD Head: 36 mm size: +5 Liner: +4 Bearing Type: ceramic/poly  Total Hip Arthroplasty (Anterior Approach) Op Note:  After informed consent was obtained and the operative extremity marked in the holding area, the patient was brought back to the operating room and placed supine on the HANA table. Next, the operative extremity was prepped and draped in normal sterile fashion. Surgical timeout occurred verifying patient identification, surgical site, surgical procedure and administration of antibiotics.  A Hueter approach to the hip was performed, using the interval between tensor fascia lata and sartorius.  Dissection was carried bluntly down onto the anterior hip capsule. The lateral femoral circumflex vessels were identified and coagulated. A capsulotomy was performed and the capsular flaps tagged for later repair.  The neck osteotomy was performed. The femoral head was removed which showed severe wear, the acetabular rim was cleared of soft tissue and attention was turned to reaming the acetabulum.  Sequential reaming was performed under fluoroscopic guidance. We reamed to a size 53 mm, and then impacted the acetabular shell in 40 degrees of abduction and 20 degrees of anteversion to match his native anatomy.  Anterior osteophytes were removed with an osteotome. A 30 mm cancellous screw was placed through the shell for added fixation.  The liner was then placed after  irrigation and attention turned to the femur.  After placing the femoral hook, the leg was taken to externally rotated, extended and adducted position taking care to perform soft tissue releases to allow for adequate mobilization of the femur. Soft tissue was cleared from the shoulder of the greater trochanter and the hook elevator used to improve exposure of the proximal femur. Sequential broaching performed up to a size 6. Trial neck and head were placed and I found that a standard neck with a +5 head ball most restored offset and equalized his leg lengths. The leg was brought back up to neutral and the construct reduced.  Antibiotic irrigation was placed in the surgical wound.  The position and sizing of components, offset and leg lengths were checked using fluoroscopy. Stability of the construct was checked in extension and external rotation without any subluxation or impingement of prosthesis. We dislocated the prosthesis, dropped the leg back into position, removed trial components, and irrigated copiously. The final stem and head was then placed, the leg brought back up, the system reduced and fluoroscopy used to verify positioning.  We irrigated, obtained hemostasis and closed the capsule using #2 ethibond suture.  One gram of vancomycin powder was placed in the surgical bed.   One gram of topical tranexamic acid was injected into the joint.  The fascia was closed with #1 vicryl plus, the deep fat layer was closed with 0 vicryl, the subcutaneous layers closed with 2.0 Vicryl Plus and the skin closed with 3.0 monocryl and steri strips. A sterile dressing was applied. The patient was awakened in the operating room and taken to recovery in stable condition.  All sponge, needle, and instrument counts were correct at the  end of the case.   Tawanna Cooler, my PA, was a medical necessity for opening, closing, limb positioning, retracting, exposing, and overall facilitation and timely completion of the  surgery.  Position: supine  Complications: see description of procedure.  Time Out: performed   Drains/Packing: none  Estimated blood loss: see anesthesia record  Returned to Recovery Room: in good condition.   Antibiotics: yes   Mechanical VTE (DVT) Prophylaxis: sequential compression devices, TED thigh-high  Chemical VTE (DVT) Prophylaxis: aspirin   Fluid Replacement: see anesthesia record  Specimens Removed: 1 to pathology   Sponge and Instrument Count Correct? yes   PACU: portable radiograph - low AP   Plan/RTC: Return in 2 weeks for staple removal. Weight Bearing/Load Lower Extremity: full  Hip precautions: none Suture Removal: 2 weeks   N. Eduard Roux, MD [facility] 9:01 AM   Implant Name Type Inv. Item Serial No. Manufacturer Lot No. LRB No. Used Action  ACETAB CUP W/GRIPTION 54 - [id number] Plate ACETAB CUP W/GRIPTION 54  [employer] [id number] Right 1 Implanted  LINER NEUTRAL 54X36MM PLUS 4 - [id number] Hips LINER NEUTRAL 54X36MM PLUS 4  [employer] [id number] Right 1 Implanted  SCREW 6.5MMX30MM - [id number] Screw SCREW 6.5MMX30MM  [employer] [id number] Right 1 Implanted  STEM FEMORAL SZ 6MM STD ACTIS - [id number] Stem STEM FEMORAL SZ 6MM STD ACTIS  [employer] [id number] Right 1 Implanted  HEAD CERAMIC 36 PLUS5 - [id number] Hips HEAD CERAMIC 36 PLUS5  [employer] [id number] Right 1 Implanted

## 2022-06-20 NOTE — Anesthesia Postprocedure Evaluation (Signed)
Anesthesia Post Note  Patient: Caleb Rogers  Procedure(s) Performed: RIGHT TOTAL HIP ARTHROPLASTY ANTERIOR APPROACH (Right: Hip)     Patient location during evaluation: PACU Anesthesia Type: Spinal Level of consciousness: awake and alert Pain management: pain level controlled Vital Signs Assessment: post-procedure vital signs reviewed and stable Respiratory status: spontaneous breathing, nonlabored ventilation and respiratory function stable Cardiovascular status: blood pressure returned to baseline and stable Postop Assessment: no apparent nausea or vomiting Anesthetic complications: no   No notable events documented.  Last Vitals:  Vitals:   06/20/22 1005 06/20/22 1015  BP: 100/67 (!) 117/56  Pulse: (!) 44 (!) 43  Resp: 17 12  Temp:  36.7 C  SpO2: 99% 93%    Last Pain:  Vitals:   06/20/22 1015  TempSrc:   PainSc: 0-No pain                 Lynda Rainwater

## 2022-06-20 NOTE — Transfer of Care (Signed)
Immediate Anesthesia Transfer of Care Note  Patient: Liliana Brentlinger  Procedure(s) Performed: RIGHT TOTAL HIP ARTHROPLASTY ANTERIOR APPROACH (Right: Hip)  Patient Location: PACU  Anesthesia Type:MAC combined with regional for post-op pain  Level of Consciousness: awake, alert , and oriented  Airway & Oxygen Therapy: Patient Spontanous Breathing and Patient connected to face mask oxygen  Post-op Assessment: Report given to RN and Post -op Vital signs reviewed and stable  Post vital signs: Reviewed and stable  Last Vitals:  Vitals Value Taken Time  BP 105/56 06/20/22 0933  Temp 98   Pulse 39 06/20/22 0936  Resp 12 06/20/22 0936  SpO2 100 % 06/20/22 0936  Vitals shown include unvalidated device data.  Last Pain:  Vitals:   06/20/22 0626  TempSrc: Oral         Complications: No notable events documented.

## 2022-06-20 NOTE — Anesthesia Procedure Notes (Signed)
Spinal  Patient location during procedure: OR Start time: 06/20/2022 7:38 AM End time: 06/20/2022 7:43 AM Reason for block: surgical anesthesia Staffing Performed: anesthesiologist  Anesthesiologist: Lynda Rainwater, MD Performed by: Lynda Rainwater, MD Authorized by: Lynda Rainwater, MD   Preanesthetic Checklist Completed: patient identified, IV checked, site marked, risks and benefits discussed, surgical consent, monitors and equipment checked, pre-op evaluation and timeout performed Spinal Block Patient position: sitting Prep: DuraPrep Patient monitoring: heart rate, cardiac monitor, continuous pulse ox and blood pressure Approach: midline Location: L3-4 Injection technique: single-shot Needle Needle type: Quincke  Needle gauge: 22 G Needle length: 9 cm Assessment Sensory level: T4 Events: CSF return

## 2022-06-21 ENCOUNTER — Encounter (HOSPITAL_COMMUNITY): Payer: Self-pay | Admitting: Orthopaedic Surgery

## 2022-06-21 ENCOUNTER — Other Ambulatory Visit: Payer: Self-pay | Admitting: Physician Assistant

## 2022-06-21 DIAGNOSIS — Z87891 Personal history of nicotine dependence: Secondary | ICD-10-CM | POA: Diagnosis not present

## 2022-06-21 DIAGNOSIS — Z7982 Long term (current) use of aspirin: Secondary | ICD-10-CM | POA: Diagnosis not present

## 2022-06-21 DIAGNOSIS — I10 Essential (primary) hypertension: Secondary | ICD-10-CM | POA: Diagnosis not present

## 2022-06-21 DIAGNOSIS — M1611 Unilateral primary osteoarthritis, right hip: Secondary | ICD-10-CM | POA: Diagnosis not present

## 2022-06-21 DIAGNOSIS — Z79899 Other long term (current) drug therapy: Secondary | ICD-10-CM | POA: Diagnosis not present

## 2022-06-21 LAB — CBC
HCT: 33.2 % — ABNORMAL LOW (ref 39.0–52.0)
Hemoglobin: 12.1 g/dL — ABNORMAL LOW (ref 13.0–17.0)
MCH: 31.6 pg (ref 26.0–34.0)
MCHC: 36.4 g/dL — ABNORMAL HIGH (ref 30.0–36.0)
MCV: 86.7 fL (ref 80.0–100.0)
Platelets: 251 10*3/uL (ref 150–400)
RBC: 3.83 MIL/uL — ABNORMAL LOW (ref 4.22–5.81)
RDW: 12.6 % (ref 11.5–15.5)
WBC: 9.3 10*3/uL (ref 4.0–10.5)
nRBC: 0 % (ref 0.0–0.2)

## 2022-06-21 MED ORDER — HYDROCODONE-ACETAMINOPHEN 5-325 MG PO TABS: 1.0000 | ORAL_TABLET | Freq: Four times a day (QID) | ORAL | 0 refills | Status: DC | PRN

## 2022-06-21 MED ORDER — HYDROCODONE-ACETAMINOPHEN 5-325 MG PO TABS
1.0000 | ORAL_TABLET | ORAL | Status: DC | PRN
  Administered 2022-06-21: 1 via ORAL
  Filled 2022-06-21: qty 2

## 2022-06-21 MED ORDER — INFLUENZA VAC A&B SA ADJ QUAD 0.5 ML IM PRSY: 0.5000 mL | PREFILLED_SYRINGE | INTRAMUSCULAR | Status: DC

## 2022-06-21 NOTE — Progress Notes (Signed)
Physical Therapy Treatment Patient Details Name: Caleb Rogers MRN: [id number] DOB: 1950-09-29 Today's Date: 06/21/2022   History of Present Illness Pt is 71 yo male s/p R anterior THA on 06/20/22.  Pt with hx of GERD, HTN, OSA    PT Comments    Pt received in supine, agreeable to therapy session after premedication with muscle relaxant, reporting mild pain and with good effort and participation in all functional mobility tasks. Pt needing up to min guard for safety with bed mobility and transfers using gait belt as leg lifter for OOB and for some HEP exercises. Pt needing Supervision after initial instruction for gait with RW and stairs with single rail with multiple bouts of practice on stairs for proper sequencing. From a functional mobility standpoint, anticipate pt safe to DC home with family assist PRN once medically cleared. He will not require additional PT sessions today but if he remains acutely we will see following day to ensure pt safety with functional mobility tasks.  Recommendations for follow up therapy are one component of a multi-disciplinary discharge planning process, led by the attending physician.  Recommendations may be updated based on patient status, additional functional criteria and insurance authorization.  Follow Up Recommendations  Follow physician's recommendations for discharge plan and follow up therapies     Assistance Recommended at Discharge Intermittent Supervision/Assistance  Patient can return home with the following A little help with walking and/or transfers;A little help with bathing/dressing/bathroom;Assistance with cooking/housework;Help with stairs or ramp for entrance   Equipment Recommendations  None recommended by PT    Recommendations for Other Services       Precautions / Restrictions Precautions Precautions: Fall Restrictions Weight Bearing Restrictions: Yes Other Position/Activity Restrictions: WBAT RLE     Mobility  Bed  Mobility Overal bed mobility: Needs Assistance Bed Mobility: Supine to Sit, Sit to Supine     Supine to sit: Min guard     General bed mobility comments: cues and pt shown how to use gait belt as strap to assist for RLE mgmt/lowering; HOB flat and cues not to use rail to simulate home set-up.    Transfers Overall transfer level: Needs assistance Equipment used: Rolling walker (2 wheels) Transfers: Sit to/from Stand Sit to Stand: Min guard, Supervision           General transfer comment: cues for hand placement and R LE managment, pt tends to initiate stand/sit prior to cues being given but receptive to all cues.    Ambulation/Gait Ambulation/Gait assistance: Supervision Gait Distance (Feet): 300 Feet Assistive device: Rolling walker (2 wheels) Gait Pattern/deviations: Step-through pattern, Decreased stride length, Step-to pattern Gait velocity: decreased     General Gait Details: initial cues for sequencing and RW proximity; no LOB or buckling throughout, SpO2/HR stable   Stairs Stairs: Yes Stairs assistance: Min guard, Supervision Stair Management: One rail Right, Step to pattern, Forwards Number of Stairs: 5 (4 steps in regular stairwell, then 5 steps of varying heights in PT gym) General stair comments: cues for step-to and proper sequencing, pt at times forgetting cues and stepping with "good" leg down, but no buckling. Handout given to reinforce (on HEP packet)   Wheelchair Mobility    Modified Rankin (Stroke Patients Only)       Balance Overall balance assessment: Needs assistance Sitting-balance support: No upper extremity supported Sitting balance-Leahy Scale: Good     Standing balance support: Bilateral upper extremity supported, No upper extremity supported Standing balance-Leahy Scale: Fair Standing balance comment: RW to ambulate  and steady but could static stand without support                            Cognition  Arousal/Alertness: Awake/alert Behavior During Therapy: WFL for tasks assessed/performed Overall Cognitive Status: Within Functional Limits for tasks assessed                                          Exercises Total Joint Exercises Ankle Circles/Pumps: AROM, Both, 10 reps, Supine Quad Sets: AROM, Right, 5 reps, Supine (pt also activating L when cued) Short Arc Quad: AROM, Right, 5 reps, Supine (5 sec hold) Heel Slides: AROM, AAROM, Right, 5 reps, Supine Hip ABduction/ADduction: AROM, AAROM, Right, 5 reps, Supine (improved ROM with plastic bag under RLE) Straight Leg Raises: AAROM, Right (a few reps) Long Arc Quad: AROM, Right, 5 reps, Seated (5 sec hold)    General Comments General comments (skin integrity, edema, etc.): HR 60's bpm ambulating and SpO2 92-95% on RA with exertion; BP 118/96 seated EOB initially, no dizziness reported with transfers/gait      Pertinent Vitals/Pain Pain Assessment Pain Assessment: 0-10 Pain Score: 2  Pain Location: R hip Pain Descriptors / Indicators: Sore Pain Intervention(s): Monitored during session, Premedicated before session, Repositioned, Ice applied    Home Living                          Prior Function            PT Goals (current goals can now be found in the care plan section) Acute Rehab PT Goals Patient Stated Goal: return home PT Goal Formulation: With patient/family Time For Goal Achievement: 07/04/22 Progress towards PT goals: Progressing toward goals    Frequency    7X/week      PT Plan Current plan remains appropriate       AM-PAC PT "6 Clicks" Mobility   Outcome Measure  Help needed turning from your back to your side while in a flat bed without using bedrails?: None Help needed moving from lying on your back to sitting on the side of a flat bed without using bedrails?: A Little Help needed moving to and from a bed to a chair (including a wheelchair)?: A Little Help needed standing  up from a chair using your arms (e.g., wheelchair or bedside chair)?: A Little Help needed to walk in hospital room?: A Little Help needed climbing 3-5 steps with a railing? : A Little 6 Click Score: 19    End of Session Equipment Utilized During Treatment: Gait belt Activity Tolerance: Patient tolerated treatment well Patient left: in chair;with call bell/phone within reach Nurse Communication: Mobility status;Other (comment) (Ok for DC) PT Visit Diagnosis: Other abnormalities of gait and mobility (R26.89);Muscle weakness (generalized) (M62.81)     Time: 1031-1101 PT Time Calculation (min) (ACUTE ONLY): 30 min  Charges:  $Gait Training: 8-22 mins $Therapeutic Exercise: 8-22 mins                     British Moyd P., PTA Acute Rehabilitation Services Secure Chat Preferred 9a-5:30pm Office: [place] 06/21/2022, 11:14 AM

## 2022-06-21 NOTE — Progress Notes (Signed)
PT. and wife verbalized understanding of discharge instruction.

## 2022-06-21 NOTE — Discharge Summary (Signed)
Patient ID: Caleb Rogers MRN: [id number] DOB/AGE: 1951-01-31 71 y.o.  Admit date: 06/20/2022 Discharge date: 06/21/2022  Admission Diagnoses:  Principal Problem:   Primary osteoarthritis of right hip Active Problems:   Status post total replacement of right hip   Discharge Diagnoses:  Same  Past Medical History:  Diagnosis Date   Gait instability 08/23/2013   GERD (gastroesophageal reflux disease)    Hypertension    Impaired fasting glucose    OSA (obstructive sleep apnea) 02/24/2014   Pure hypercholesterolemia    Sleep apnea     Surgeries: Procedure(s): RIGHT TOTAL HIP ARTHROPLASTY ANTERIOR APPROACH on 06/20/2022   Consultants:   Discharged Condition: Improved  Hospital Course: Gatsby Chismar is an 71 y.o. male who was admitted 06/20/2022 for operative treatment ofPrimary osteoarthritis of right hip. Patient has severe unremitting pain that affects sleep, daily activities, and work/hobbies. After pre-op clearance the patient was taken to the operating room on 06/20/2022 and underwent  Procedure(s): RIGHT TOTAL HIP ARTHROPLASTY ANTERIOR APPROACH.    Patient was given perioperative antibiotics:  Anti-infectives (From admission, onward)    Start     Dose/Rate Route Frequency Ordered Stop   06/20/22 1400  ceFAZolin (ANCEF) IVPB 2g/100 mL premix        2 g 200 mL/hr over 30 Minutes Intravenous Every 6 hours 06/20/22 1013 06/20/22 2039   06/20/22 1026  ceFAZolin (ANCEF) 2-4 GM/100ML-% IVPB       Note to Pharmacy: Ulice Dash: cabinet override      06/20/22 1026 06/20/22 2229   06/20/22 0815  vancomycin (VANCOCIN) powder  Status:  Discontinued          As needed 06/20/22 0815 06/20/22 0935   06/20/22 0615  ceFAZolin (ANCEF) IVPB 2g/100 mL premix        2 g 200 mL/hr over 30 Minutes Intravenous On call to O.R. 06/20/22 2505 06/20/22 0752        Patient was given sequential compression devices, early ambulation, and chemoprophylaxis to prevent DVT.  Patient  benefited maximally from hospital stay and there were no complications.    Recent vital signs: Patient Vitals for the past 24 hrs:  BP Temp Temp src Pulse Resp SpO2  06/21/22 0740 126/61 99.1 F (37.3 C) Oral 61 -- 93 %  06/20/22 2328 123/65 97.8 F (36.6 C) Oral 61 17 98 %  06/20/22 1957 120/64 98 F (36.7 C) Oral (!) 59 13 96 %  06/20/22 1343 -- 97.8 F (36.6 C) -- -- -- --  06/20/22 1300 (!) 140/73 97.7 F (36.5 C) -- (!) 47 14 100 %  06/20/22 1205 -- -- -- (!) 44 11 99 %  06/20/22 1105 130/71 98.2 F (36.8 C) -- (!) 40 13 100 %  06/20/22 1015 (!) 117/56 98 F (36.7 C) -- (!) 43 12 93 %  06/20/22 1005 100/67 -- -- (!) 44 17 99 %  06/20/22 0950 116/62 -- -- (!) 44 12 98 %  06/20/22 0935 (!) 105/56 (!) 97.3 F (36.3 C) -- (!) 46 11 100 %     Recent laboratory studies:  Recent Labs    06/21/22 0610  WBC 9.3  HGB 12.1*  HCT 33.2*  PLT 251     Discharge Medications:   Allergies as of 06/21/2022       Reactions   Atorvastatin Other (See Comments)   Other Reaction(s): leg pains   Other Other (See Comments)   Tree & scrubs        Medication List  STOP taking these medications    oxyCODONE-acetaminophen 5-325 MG tablet Commonly known as: Percocet       TAKE these medications    aspirin EC 81 MG tablet Take 1 tablet (81 mg total) by mouth 2 (two) times daily. To be taken after surgery to prevent blood clots What changed: Another medication with the same name was removed. Continue taking this medication, and follow the directions you see here.   docusate sodium 100 MG capsule Commonly known as: Colace Take 1 capsule (100 mg total) by mouth daily as needed.   ezetimibe 10 MG tablet Commonly known as: Zetia Take 1 tablet (10 mg total) by mouth daily.   HYDROcodone-acetaminophen 5-325 MG tablet Commonly known as: Norco Take 1-2 tablets by mouth every 6 (six) hours as needed. To be taken after surgery instead of percocet   MAGNESIUM PO Take 1  tablet by mouth daily.   methocarbamol 750 MG tablet Commonly known as: Robaxin-750 Take 1 tablet (750 mg total) by mouth 2 (two) times daily as needed for muscle spasms.   omeprazole 40 MG capsule Commonly known as: PRILOSEC Take 40 mg by mouth daily.   ondansetron 4 MG tablet Commonly known as: Zofran Take 1 tablet (4 mg total) by mouth daily as needed for nausea or vomiting.   POTASSIUM PO Take 1 tablet by mouth daily.   Repatha SureClick 468 MG/ML Soaj Generic drug: Evolocumab Inject 140 mg into the skin every 14 (fourteen) days.   valsartan-hydrochlorothiazide 160-25 MG tablet Commonly known as: Diovan HCT Take 1 tablet by mouth daily.               Durable Medical Equipment  (From admission, onward)           Start     Ordered   06/20/22 1014  DME Walker rolling  Once       Question:  Patient needs a walker to treat with the following condition  Answer:  History of hip replacement   06/20/22 1013   06/20/22 1014  DME 3 n 1  Once        06/20/22 1013   06/20/22 1014  DME Bedside commode  Once       Question:  Patient needs a bedside commode to treat with the following condition  Answer:  History of hip replacement   06/20/22 1013            Diagnostic Studies: DG Pelvis Portable  Result Date: 06/20/2022 CLINICAL DATA:  Postop hip replacement EXAM: PORTABLE PELVIS 1-2 VIEWS COMPARISON:  05/18/2022 FINDINGS: Changes of right hip replacement. Normal AP alignment. No hardware bony complicating feature. IMPRESSION: Right hip replacement.  No visible complicating feature. Electronically Signed   By: Rolm Baptise M.D.   On: 06/20/2022 10:30   DG HIP UNILAT WITH PELVIS 1V RIGHT  Result Date: 06/20/2022 CLINICAL DATA:  Right total hip arthroplasty EXAM: DG HIP (WITH OR WITHOUT PELVIS) 1V RIGHT COMPARISON:  None Available. FINDINGS: Multiple C-arm images show performance of total hip arthroplasty on the right. Components appear well positioned. No  radiographically detectable complication. IMPRESSION: Good appearance following total hip arthroplasty on the right. Electronically Signed   By: Nelson Chimes M.D.   On: 06/20/2022 09:17   DG C-Arm 1-60 Min-No Report  Result Date: 06/20/2022 Fluoroscopy was utilized by the requesting physician.  No radiographic interpretation.   DG C-Arm 1-60 Min-No Report  Result Date: 06/20/2022 Fluoroscopy was utilized by the requesting physician.  No radiographic interpretation.  Disposition:      Follow-up Information     Leandrew Koyanagi, MD. Schedule an appointment as soon as possible for a visit in 2 week(s).   Specialty: Orthopedic Surgery Contact information: [place] [address] [phone]         [name], MD Follow up.   Specialty: Internal Medicine Contact information: [place] [address] [place], [place] Follow up.   Specialty: Home Health Services Why: Home health PT services setup by your provider with [facility] information: [place] [address] [phone]                  Signed: Aundra Dubin 06/21/2022, 7:49 AM

## 2022-06-21 NOTE — Progress Notes (Signed)
   06/21/22 1405  Clinical Encounter Type  Visited With Patient  Visit Type Initial;Other (Comment) (Advanced Directive)  Referral From Nurse  Consult/Referral To Chaplain   Chaplain responded to a spiritual consult for advanced directive education. The patient Caleb Rogers, was awaiting discharge and planned to work through the paperwork at home and return it to his doctor.   Danice Goltz [facility]  [phone]

## 2022-06-21 NOTE — Plan of Care (Signed)

## 2022-06-21 NOTE — Progress Notes (Signed)
Subjective: 1 Day Post-Op Procedure(s) (LRB): RIGHT TOTAL HIP ARTHROPLASTY ANTERIOR APPROACH (Right) Patient reports pain as mild.  Feeling very loopy from the oxycodone.  Otherwise, no complaints.   Objective: Vital signs in last 24 hours: Temp:  [97.3 F (36.3 C)-99.1 F (37.3 C)] 99.1 F (37.3 C) (11/30 0740) Pulse Rate:  [40-61] 61 (11/30 0740) Resp:  [11-17] 17 (11/29 2328) BP: (100-140)/(56-73) 126/61 (11/30 0740) SpO2:  [93 %-100 %] 93 % (11/30 0740)  Intake/Output from previous day: 11/29 0701 - 11/30 0700 In: 800 [I.V.:800] Out: 2300 [Urine:2200; Blood:100] Intake/Output this shift: No intake/output data recorded.  Recent Labs    06/21/22 0610  HGB 12.1*   Recent Labs    06/21/22 0610  WBC 9.3  RBC 3.83*  HCT 33.2*  PLT 251   No results for input(s): "NA", "K", "CL", "CO2", "BUN", "CREATININE", "GLUCOSE", "CALCIUM" in the last 72 hours. No results for input(s): "LABPT", "INR" in the last 72 hours.  Neurologically intact Neurovascular intact Sensation intact distally Intact pulses distally Dorsiflexion/Plantar flexion intact Incision: dressing C/D/I No cellulitis present Compartment soft   Assessment/Plan: 1 Day Post-Op Procedure(s) (LRB): RIGHT TOTAL HIP ARTHROPLASTY ANTERIOR APPROACH (Right) Advance diet Up with therapy D/C IV fluids Discharge home with home health after second PT session as long as cleared by PT WBAT RLE ABLA- mild and stable I have d/c oxycodone/oxycontin and have added norco.  Also changed d/c pain meds to norco.  Patient is aware       Aundra Dubin 06/21/2022, 7:47 AM

## 2022-06-22 ENCOUNTER — Telehealth: Payer: Self-pay | Admitting: Orthopaedic Surgery

## 2022-06-22 DIAGNOSIS — Z87891 Personal history of nicotine dependence: Secondary | ICD-10-CM | POA: Diagnosis not present

## 2022-06-22 DIAGNOSIS — G4733 Obstructive sleep apnea (adult) (pediatric): Secondary | ICD-10-CM | POA: Diagnosis not present

## 2022-06-22 DIAGNOSIS — G4710 Hypersomnia, unspecified: Secondary | ICD-10-CM | POA: Diagnosis not present

## 2022-06-22 DIAGNOSIS — Z96641 Presence of right artificial hip joint: Secondary | ICD-10-CM | POA: Diagnosis not present

## 2022-06-22 DIAGNOSIS — Z471 Aftercare following joint replacement surgery: Secondary | ICD-10-CM | POA: Diagnosis not present

## 2022-06-22 DIAGNOSIS — Z7982 Long term (current) use of aspirin: Secondary | ICD-10-CM | POA: Diagnosis not present

## 2022-06-22 DIAGNOSIS — I10 Essential (primary) hypertension: Secondary | ICD-10-CM | POA: Diagnosis not present

## 2022-06-22 DIAGNOSIS — Z9181 History of falling: Secondary | ICD-10-CM | POA: Diagnosis not present

## 2022-06-22 DIAGNOSIS — K219 Gastro-esophageal reflux disease without esophagitis: Secondary | ICD-10-CM | POA: Diagnosis not present

## 2022-06-22 DIAGNOSIS — K5900 Constipation, unspecified: Secondary | ICD-10-CM | POA: Diagnosis not present

## 2022-06-22 DIAGNOSIS — E7800 Pure hypercholesterolemia, unspecified: Secondary | ICD-10-CM | POA: Diagnosis not present

## 2022-06-22 NOTE — Telephone Encounter (Signed)
Likely from trauma from the catheter.  If it continues beyond a week, he should see his PCP

## 2022-06-22 NOTE — Telephone Encounter (Signed)
Patient called in stating he saw blood in urine after surgery and was wondering is that concerning and what should be done about please call back at [phone]

## 2022-06-25 ENCOUNTER — Ambulatory Visit (HOSPITAL_COMMUNITY)
Admission: RE | Admit: 2022-06-25 | Discharge: 2022-06-25 | Disposition: A | Discharge status: Home / Self Care | Payer: Medicare HMO | Source: Ambulatory Visit | Attending: Orthopaedic Surgery | Admitting: Orthopaedic Surgery

## 2022-06-25 ENCOUNTER — Telehealth: Payer: Self-pay | Admitting: Radiology

## 2022-06-25 ENCOUNTER — Telehealth: Payer: Self-pay | Admitting: *Deleted

## 2022-06-25 ENCOUNTER — Other Ambulatory Visit: Payer: Self-pay | Admitting: *Deleted

## 2022-06-25 DIAGNOSIS — I10 Essential (primary) hypertension: Secondary | ICD-10-CM | POA: Diagnosis not present

## 2022-06-25 DIAGNOSIS — Z96641 Presence of right artificial hip joint: Secondary | ICD-10-CM | POA: Diagnosis not present

## 2022-06-25 DIAGNOSIS — K5900 Constipation, unspecified: Secondary | ICD-10-CM | POA: Diagnosis not present

## 2022-06-25 DIAGNOSIS — Z9181 History of falling: Secondary | ICD-10-CM | POA: Diagnosis not present

## 2022-06-25 DIAGNOSIS — Z471 Aftercare following joint replacement surgery: Secondary | ICD-10-CM | POA: Diagnosis not present

## 2022-06-25 DIAGNOSIS — G4733 Obstructive sleep apnea (adult) (pediatric): Secondary | ICD-10-CM | POA: Diagnosis not present

## 2022-06-25 DIAGNOSIS — M1611 Unilateral primary osteoarthritis, right hip: Secondary | ICD-10-CM

## 2022-06-25 DIAGNOSIS — Z87891 Personal history of nicotine dependence: Secondary | ICD-10-CM | POA: Diagnosis not present

## 2022-06-25 DIAGNOSIS — G4710 Hypersomnia, unspecified: Secondary | ICD-10-CM | POA: Diagnosis not present

## 2022-06-25 DIAGNOSIS — E7800 Pure hypercholesterolemia, unspecified: Secondary | ICD-10-CM | POA: Diagnosis not present

## 2022-06-25 DIAGNOSIS — Z7982 Long term (current) use of aspirin: Secondary | ICD-10-CM | POA: Diagnosis not present

## 2022-06-25 DIAGNOSIS — K219 Gastro-esophageal reflux disease without esophagitis: Secondary | ICD-10-CM | POA: Diagnosis not present

## 2022-06-25 NOTE — Telephone Encounter (Signed)
Thanks

## 2022-06-25 NOTE — Telephone Encounter (Signed)
Sheri, can you give them a call please.  Thanks.

## 2022-06-25 NOTE — Telephone Encounter (Signed)
Ortho bundle call completed. 

## 2022-06-25 NOTE — Progress Notes (Addendum)
Lower extremity venous duplex has been completed.   Preliminary results in CV Proc.  Attempted to call results, no answer.   Jinny Blossom Sherod Cisse 06/25/2022 1:47 PM

## 2022-06-27 DIAGNOSIS — G4733 Obstructive sleep apnea (adult) (pediatric): Secondary | ICD-10-CM | POA: Diagnosis not present

## 2022-06-27 DIAGNOSIS — Z9181 History of falling: Secondary | ICD-10-CM | POA: Diagnosis not present

## 2022-06-27 DIAGNOSIS — G4710 Hypersomnia, unspecified: Secondary | ICD-10-CM | POA: Diagnosis not present

## 2022-06-27 DIAGNOSIS — Z96641 Presence of right artificial hip joint: Secondary | ICD-10-CM | POA: Diagnosis not present

## 2022-06-27 DIAGNOSIS — E7800 Pure hypercholesterolemia, unspecified: Secondary | ICD-10-CM | POA: Diagnosis not present

## 2022-06-27 DIAGNOSIS — Z7982 Long term (current) use of aspirin: Secondary | ICD-10-CM | POA: Diagnosis not present

## 2022-06-27 DIAGNOSIS — K219 Gastro-esophageal reflux disease without esophagitis: Secondary | ICD-10-CM | POA: Diagnosis not present

## 2022-06-27 DIAGNOSIS — I10 Essential (primary) hypertension: Secondary | ICD-10-CM | POA: Diagnosis not present

## 2022-06-27 DIAGNOSIS — Z471 Aftercare following joint replacement surgery: Secondary | ICD-10-CM | POA: Diagnosis not present

## 2022-06-27 DIAGNOSIS — Z87891 Personal history of nicotine dependence: Secondary | ICD-10-CM | POA: Diagnosis not present

## 2022-06-27 DIAGNOSIS — K5900 Constipation, unspecified: Secondary | ICD-10-CM | POA: Diagnosis not present

## 2022-06-28 ENCOUNTER — Telehealth: Payer: Self-pay | Admitting: *Deleted

## 2022-06-28 NOTE — Telephone Encounter (Signed)
Ortho bundle 7 day call completed. 

## 2022-06-29 DIAGNOSIS — Z87891 Personal history of nicotine dependence: Secondary | ICD-10-CM | POA: Diagnosis not present

## 2022-06-29 DIAGNOSIS — I10 Essential (primary) hypertension: Secondary | ICD-10-CM | POA: Diagnosis not present

## 2022-06-29 DIAGNOSIS — K219 Gastro-esophageal reflux disease without esophagitis: Secondary | ICD-10-CM | POA: Diagnosis not present

## 2022-06-29 DIAGNOSIS — G4710 Hypersomnia, unspecified: Secondary | ICD-10-CM | POA: Diagnosis not present

## 2022-06-29 DIAGNOSIS — E7800 Pure hypercholesterolemia, unspecified: Secondary | ICD-10-CM | POA: Diagnosis not present

## 2022-06-29 DIAGNOSIS — K5900 Constipation, unspecified: Secondary | ICD-10-CM | POA: Diagnosis not present

## 2022-06-29 DIAGNOSIS — Z96641 Presence of right artificial hip joint: Secondary | ICD-10-CM | POA: Diagnosis not present

## 2022-06-29 DIAGNOSIS — Z9181 History of falling: Secondary | ICD-10-CM | POA: Diagnosis not present

## 2022-06-29 DIAGNOSIS — Z471 Aftercare following joint replacement surgery: Secondary | ICD-10-CM | POA: Diagnosis not present

## 2022-06-29 DIAGNOSIS — Z7982 Long term (current) use of aspirin: Secondary | ICD-10-CM | POA: Diagnosis not present

## 2022-06-29 DIAGNOSIS — G4733 Obstructive sleep apnea (adult) (pediatric): Secondary | ICD-10-CM | POA: Diagnosis not present

## 2022-07-02 ENCOUNTER — Telehealth: Payer: Self-pay | Admitting: Orthopaedic Surgery

## 2022-07-02 DIAGNOSIS — G4710 Hypersomnia, unspecified: Secondary | ICD-10-CM | POA: Diagnosis not present

## 2022-07-02 DIAGNOSIS — G4733 Obstructive sleep apnea (adult) (pediatric): Secondary | ICD-10-CM | POA: Diagnosis not present

## 2022-07-02 DIAGNOSIS — Z7982 Long term (current) use of aspirin: Secondary | ICD-10-CM | POA: Diagnosis not present

## 2022-07-02 DIAGNOSIS — Z87891 Personal history of nicotine dependence: Secondary | ICD-10-CM | POA: Diagnosis not present

## 2022-07-02 DIAGNOSIS — K5900 Constipation, unspecified: Secondary | ICD-10-CM | POA: Diagnosis not present

## 2022-07-02 DIAGNOSIS — Z9181 History of falling: Secondary | ICD-10-CM | POA: Diagnosis not present

## 2022-07-02 DIAGNOSIS — I10 Essential (primary) hypertension: Secondary | ICD-10-CM | POA: Diagnosis not present

## 2022-07-02 DIAGNOSIS — Z96641 Presence of right artificial hip joint: Secondary | ICD-10-CM | POA: Diagnosis not present

## 2022-07-02 DIAGNOSIS — E7800 Pure hypercholesterolemia, unspecified: Secondary | ICD-10-CM | POA: Diagnosis not present

## 2022-07-02 DIAGNOSIS — Z471 Aftercare following joint replacement surgery: Secondary | ICD-10-CM | POA: Diagnosis not present

## 2022-07-02 DIAGNOSIS — K219 Gastro-esophageal reflux disease without esophagitis: Secondary | ICD-10-CM | POA: Diagnosis not present

## 2022-07-02 NOTE — Telephone Encounter (Signed)
Patient had right total hip arthroplasty on 06-20-22 and is requesting pain Rx to be called in at [facility] [place].  Patient's post op is this Wednesday at 10:30am.  Patient's call back is [phone]

## 2022-07-03 ENCOUNTER — Other Ambulatory Visit: Payer: Self-pay | Admitting: Physician Assistant

## 2022-07-03 MED ORDER — HYDROCODONE-ACETAMINOPHEN 5-325 MG PO TABS: 1.0000 | ORAL_TABLET | Freq: Three times a day (TID) | ORAL | 0 refills | Status: DC | PRN

## 2022-07-03 NOTE — Telephone Encounter (Signed)
sent 

## 2022-07-03 NOTE — Telephone Encounter (Signed)
Called and advised pt.

## 2022-07-04 ENCOUNTER — Ambulatory Visit (INDEPENDENT_AMBULATORY_CARE_PROVIDER_SITE_OTHER): Payer: Medicare HMO | Admitting: Orthopaedic Surgery

## 2022-07-04 ENCOUNTER — Encounter: Payer: Self-pay | Admitting: Orthopaedic Surgery

## 2022-07-04 DIAGNOSIS — Z96641 Presence of right artificial hip joint: Secondary | ICD-10-CM

## 2022-07-04 NOTE — Progress Notes (Signed)
Post-Op Visit Note   Patient: Caleb Rogers           Date of Birth: 20-May-1951           MRN: [id number] Visit Date: 07/04/2022 PCP: [name], MD   Assessment & Plan:  Chief Complaint:  Chief Complaint  Patient presents with   Right Hip - Follow-up    Right total hip arthroplasty 06/20/2022   Visit Diagnoses:  1. Status post total replacement of right hip     Plan: Caleb Rogers is 2 weeks status post right total hip replacement.  He is doing well overall.  Takes occasional hydrocodone and muscle relaxer.  He is scheduled to finish home health PT tomorrow.  Examination of the right hip shows healed surgical incision without any evidence of infection or surrounding cellulitis.  There is no drainage.  Expected postoperative swelling of the thigh.  No neurovascular compromise distally.  Negative Homans' sign and no calf tenderness.  I am very pleased with Caleb Rogers's progress so far.  Instructions reviewed.  Implant card and handicap placard provided.  Continue aspirin for DVT prophylaxis.  Continue TED hose.  Recheck in 4 weeks with standing AP pelvis x-rays.  Follow-Up Instructions: Return in about 4 weeks (around 08/01/2022).   Orders:  No orders of the defined types were placed in this encounter.  No orders of the defined types were placed in this encounter.   Imaging: No results found.  PMFS History: Patient Active Problem List   Diagnosis Date Noted   Status post total replacement of right hip 06/20/2022   Primary osteoarthritis of right hip 05/18/2022   Loud snoring 05/11/2021   Hypersomnia with sleep apnea 05/11/2021   CPAP (continuous positive airway pressure) dependence 05/11/2021   OSA on CPAP 09/27/2014   OSA (obstructive sleep apnea) 02/24/2014   Gait instability 09/16/2013   Hypertension 09/16/2013   Hyperlipidemia 09/16/2013   TIA (transient ischemic attack) 09/16/2013   Past Medical History:  Diagnosis Date   Gait instability 08/23/2013   GERD  (gastroesophageal reflux disease)    Hypertension    Impaired fasting glucose    OSA (obstructive sleep apnea) 02/24/2014   Pure hypercholesterolemia    Sleep apnea    TIA (transient ischemic attack)    2015    Family History  Problem Relation Age of Onset   Cancer Mother    Congestive Heart Failure Mother    Coronary artery disease Father    Coronary artery disease Brother 33    Past Surgical History:  Procedure Laterality Date   COLONOSCOPY     none     TEE WITHOUT CARDIOVERSION N/A 11/11/2014   Procedure: TRANSESOPHAGEAL ECHOCARDIOGRAM (TEE);  Surgeon: Adrian Prows, MD;  Location: [facility] ENDOSCOPY;  Service: Cardiovascular;  Laterality: N/A;  h/p in file drawer   TOTAL HIP ARTHROPLASTY Right 06/20/2022   Procedure: RIGHT TOTAL HIP ARTHROPLASTY ANTERIOR APPROACH;  Surgeon: Leandrew Koyanagi, MD;  Location: [place];  Service: Orthopedics;  Laterality: Right;  3-C   Social History   Occupational History   Occupation: Banker: Building surveyor FOR SELF EMPLOYED  Tobacco Use   Smoking status: Former    Types: Cigarettes    Quit date: 07/23/1968    Years since quitting: 53.9   Smokeless tobacco: Never   Tobacco comments:    pt quit July 1997  Vaping Use   Vaping Use: Never used  Substance and Sexual Activity   Alcohol use: Not Currently   Drug  use: No   Sexual activity: Yes    Partners: Female

## 2022-07-05 DIAGNOSIS — Z87891 Personal history of nicotine dependence: Secondary | ICD-10-CM | POA: Diagnosis not present

## 2022-07-05 DIAGNOSIS — Z471 Aftercare following joint replacement surgery: Secondary | ICD-10-CM | POA: Diagnosis not present

## 2022-07-05 DIAGNOSIS — K219 Gastro-esophageal reflux disease without esophagitis: Secondary | ICD-10-CM | POA: Diagnosis not present

## 2022-07-05 DIAGNOSIS — K5900 Constipation, unspecified: Secondary | ICD-10-CM | POA: Diagnosis not present

## 2022-07-05 DIAGNOSIS — G4733 Obstructive sleep apnea (adult) (pediatric): Secondary | ICD-10-CM | POA: Diagnosis not present

## 2022-07-05 DIAGNOSIS — G4710 Hypersomnia, unspecified: Secondary | ICD-10-CM | POA: Diagnosis not present

## 2022-07-05 DIAGNOSIS — Z96641 Presence of right artificial hip joint: Secondary | ICD-10-CM | POA: Diagnosis not present

## 2022-07-05 DIAGNOSIS — E7800 Pure hypercholesterolemia, unspecified: Secondary | ICD-10-CM | POA: Diagnosis not present

## 2022-07-05 DIAGNOSIS — Z7982 Long term (current) use of aspirin: Secondary | ICD-10-CM | POA: Diagnosis not present

## 2022-07-05 DIAGNOSIS — I10 Essential (primary) hypertension: Secondary | ICD-10-CM | POA: Diagnosis not present

## 2022-07-05 DIAGNOSIS — Z9181 History of falling: Secondary | ICD-10-CM | POA: Diagnosis not present

## 2022-07-19 ENCOUNTER — Telehealth: Payer: Self-pay | Admitting: *Deleted

## 2022-07-19 NOTE — Telephone Encounter (Signed)
Ortho bundle in office visit completed (Late entry for 07/04/22).

## 2022-07-20 ENCOUNTER — Telehealth: Payer: Self-pay | Admitting: *Deleted

## 2022-07-20 NOTE — Telephone Encounter (Signed)
Ortho bundle 30 day call attempted. No answer and left VM requesting call back.

## 2022-07-25 ENCOUNTER — Encounter: Payer: Self-pay | Admitting: Orthopaedic Surgery

## 2022-07-31 ENCOUNTER — Ambulatory Visit: Payer: Medicare HMO

## 2022-07-31 DIAGNOSIS — I340 Nonrheumatic mitral (valve) insufficiency: Secondary | ICD-10-CM | POA: Diagnosis not present

## 2022-08-01 ENCOUNTER — Encounter: Payer: Self-pay | Admitting: Orthopaedic Surgery

## 2022-08-01 ENCOUNTER — Ambulatory Visit: Payer: Medicare HMO | Admitting: Orthopaedic Surgery

## 2022-08-01 ENCOUNTER — Ambulatory Visit (INDEPENDENT_AMBULATORY_CARE_PROVIDER_SITE_OTHER): Payer: Medicare HMO

## 2022-08-01 DIAGNOSIS — Z96641 Presence of right artificial hip joint: Secondary | ICD-10-CM

## 2022-08-01 MED ORDER — HYDROCODONE-ACETAMINOPHEN 5-325 MG PO TABS: 1.0000 | ORAL_TABLET | Freq: Every day | ORAL | 0 refills | Status: DC | PRN

## 2022-08-01 MED ORDER — CEFADROXIL 500 MG PO CAPS: 500.0000 mg | ORAL_CAPSULE | Freq: Two times a day (BID) | ORAL | 0 refills | Status: AC

## 2022-08-01 MED ORDER — MUPIROCIN 2 % EX OINT: 1.0000 | TOPICAL_OINTMENT | Freq: Two times a day (BID) | CUTANEOUS | 2 refills | Status: DC

## 2022-08-01 NOTE — Progress Notes (Signed)
Post-Op Visit Note   Patient: Caleb Rogers           Date of Birth: April 09, 1951           MRN: [id number] Visit Date: 08/01/2022 PCP: [name], MD   Assessment & Plan:  Chief Complaint:  Chief Complaint  Patient presents with   Right Hip - Follow-up    /Right total hip arthroplasty 06/20/2022   Visit Diagnoses:  1. Status post total replacement of right hip     Plan: Primo is now 6 weeks status post right total hip replacement on 06/20/2022.  He feels fatigue and has had a fever recently.  Denies any problems with the hip.  Examination of right hip shows any healed surgical scar with a slight small area at the proximal extent with some surrounding erythema that appears to be a superficial suture abscess.  I can now perform painless fluid range of motion of the hip.  He does have weakness in hip flexion.  There is no surrounding cellulitis or evidence of infection.  The thigh is soft and nonswollen.  The implant looks good on x-ray.  Based on our conversation I feel that he is overexerting himself and he is feeling quite fatigued.  I would recommend that he back off on activities that he can recover his physiologic reserve.  I would recommend that he stop outpatient physical therapy and do home exercises as tolerated.  I pulled out the stitch and we will send in prescription for mupirocin ointment and 10 days of cefadroxil to take.  Recheck in 6 weeks for 110-monthvisit.  Follow-Up Instructions: No follow-ups on file.   Orders:  Orders Placed This Encounter  Procedures   XR Pelvis 1-2 Views   Meds ordered this encounter  Medications   mupirocin ointment (BACTROBAN) 2 %    Sig: Apply 1 Application topically 2 (two) times daily.    Dispense:  22 g    Refill:  2   cefadroxil (DURICEF) 500 MG capsule    Sig: Take 1 capsule (500 mg total) by mouth 2 (two) times daily for 10 days.    Dispense:  20 capsule    Refill:  0    Imaging: PCV ECHOCARDIOGRAM COMPLETE  Result  Date: 08/01/2022 Echocardiogram 07/31/2022: Normal LV systolic function with visual EF 60-65%. Left ventricle cavity is normal in size. Normal left ventricular wall thickness. Normal global wall motion. Normal diastolic filling pattern, normal LAP. Left atrial cavity is normal in size. The interatrial Septum is thin and mobile but appears to be intact by 2D and CF Doppler interrogation. Trace aortic regurgitation. Trace mitral regurgitation. Mild tricuspid regurgitation. No evidence of pulmonary hypertension. Compared to 07/05/2018 severity of AR, MR, and TR have improved otherwise no significant change.    PMFS History: Patient Active Problem List   Diagnosis Date Noted   Status post total replacement of right hip 06/20/2022   Primary osteoarthritis of right hip 05/18/2022   Loud snoring 05/11/2021   Hypersomnia with sleep apnea 05/11/2021   CPAP (continuous positive airway pressure) dependence 05/11/2021   OSA on CPAP 09/27/2014   OSA (obstructive sleep apnea) 02/24/2014   Gait instability 09/16/2013   Hypertension 09/16/2013   Hyperlipidemia 09/16/2013   TIA (transient ischemic attack) 09/16/2013   Past Medical History:  Diagnosis Date   Gait instability 08/23/2013   GERD (gastroesophageal reflux disease)    Hypertension    Impaired fasting glucose    OSA (obstructive sleep apnea) 02/24/2014  Pure hypercholesterolemia    Sleep apnea    TIA (transient ischemic attack)    2015    Family History  Problem Relation Age of Onset   Cancer Mother    Congestive Heart Failure Mother    Coronary artery disease Father    Coronary artery disease Brother 53    Past Surgical History:  Procedure Laterality Date   COLONOSCOPY     none     TEE WITHOUT CARDIOVERSION N/A 11/11/2014   Procedure: TRANSESOPHAGEAL ECHOCARDIOGRAM (TEE);  Surgeon: Adrian Prows, MD;  Location: [facility] ENDOSCOPY;  Service: Cardiovascular;  Laterality: N/A;  h/p in file drawer   TOTAL HIP ARTHROPLASTY Right 06/20/2022    Procedure: RIGHT TOTAL HIP ARTHROPLASTY ANTERIOR APPROACH;  Surgeon: Leandrew Koyanagi, MD;  Location: [place];  Service: Orthopedics;  Laterality: Right;  3-C   Social History   Occupational History   Occupation: Banker: Building surveyor FOR SELF EMPLOYED  Tobacco Use   Smoking status: Former    Types: Cigarettes    Quit date: 07/23/1968    Years since quitting: 54.0   Smokeless tobacco: Never   Tobacco comments:    pt quit July 1997  Vaping Use   Vaping Use: Never used  Substance and Sexual Activity   Alcohol use: Not Currently   Drug use: No   Sexual activity: Yes    Partners: Female

## 2022-08-06 DIAGNOSIS — R509 Fever, unspecified: Secondary | ICD-10-CM | POA: Diagnosis not present

## 2022-08-06 DIAGNOSIS — Z96641 Presence of right artificial hip joint: Secondary | ICD-10-CM | POA: Diagnosis not present

## 2022-08-28 ENCOUNTER — Other Ambulatory Visit: Payer: Self-pay | Admitting: Physician Assistant

## 2022-08-28 ENCOUNTER — Encounter: Payer: Self-pay | Admitting: Orthopaedic Surgery

## 2022-08-28 MED ORDER — AMOXICILLIN 500 MG PO CAPS: ORAL_CAPSULE | ORAL | 2 refills | Status: DC

## 2022-08-28 NOTE — Telephone Encounter (Signed)
Ok, thanks.

## 2022-08-28 NOTE — Telephone Encounter (Signed)
I sent in the abx for dental work.  I think an iron supplement is a good idea.  Hard to tell what is causing the elevation in temp.  Make sure he is not constipated as that can sometimes increase temp.  Does he have much pain in the operative hip?

## 2022-08-29 DIAGNOSIS — E785 Hyperlipidemia, unspecified: Secondary | ICD-10-CM | POA: Diagnosis not present

## 2022-08-29 DIAGNOSIS — Z1212 Encounter for screening for malignant neoplasm of rectum: Secondary | ICD-10-CM | POA: Diagnosis not present

## 2022-08-29 DIAGNOSIS — Z125 Encounter for screening for malignant neoplasm of prostate: Secondary | ICD-10-CM | POA: Diagnosis not present

## 2022-08-29 DIAGNOSIS — R7989 Other specified abnormal findings of blood chemistry: Secondary | ICD-10-CM | POA: Diagnosis not present

## 2022-08-29 DIAGNOSIS — E291 Testicular hypofunction: Secondary | ICD-10-CM | POA: Diagnosis not present

## 2022-08-29 DIAGNOSIS — I2510 Atherosclerotic heart disease of native coronary artery without angina pectoris: Secondary | ICD-10-CM | POA: Diagnosis not present

## 2022-09-06 DIAGNOSIS — E785 Hyperlipidemia, unspecified: Secondary | ICD-10-CM | POA: Diagnosis not present

## 2022-09-06 DIAGNOSIS — I2510 Atherosclerotic heart disease of native coronary artery without angina pectoris: Secondary | ICD-10-CM | POA: Diagnosis not present

## 2022-09-06 DIAGNOSIS — E291 Testicular hypofunction: Secondary | ICD-10-CM | POA: Diagnosis not present

## 2022-09-06 DIAGNOSIS — D649 Anemia, unspecified: Secondary | ICD-10-CM | POA: Diagnosis not present

## 2022-09-06 DIAGNOSIS — Z0000 Encounter for general adult medical examination without abnormal findings: Secondary | ICD-10-CM | POA: Diagnosis not present

## 2022-09-06 DIAGNOSIS — I10 Essential (primary) hypertension: Secondary | ICD-10-CM | POA: Diagnosis not present

## 2022-09-06 DIAGNOSIS — R82998 Other abnormal findings in urine: Secondary | ICD-10-CM | POA: Diagnosis not present

## 2022-09-06 DIAGNOSIS — G720 Drug-induced myopathy: Secondary | ICD-10-CM | POA: Diagnosis not present

## 2022-09-06 DIAGNOSIS — I6389 Other cerebral infarction: Secondary | ICD-10-CM | POA: Diagnosis not present

## 2022-09-06 DIAGNOSIS — I77810 Thoracic aortic ectasia: Secondary | ICD-10-CM | POA: Diagnosis not present

## 2022-09-06 DIAGNOSIS — Z96641 Presence of right artificial hip joint: Secondary | ICD-10-CM | POA: Diagnosis not present

## 2022-09-06 DIAGNOSIS — Z23 Encounter for immunization: Secondary | ICD-10-CM | POA: Diagnosis not present

## 2022-09-06 DIAGNOSIS — R918 Other nonspecific abnormal finding of lung field: Secondary | ICD-10-CM | POA: Diagnosis not present

## 2022-09-10 ENCOUNTER — Other Ambulatory Visit: Payer: Self-pay | Admitting: Orthopaedic Surgery

## 2022-09-10 ENCOUNTER — Encounter: Payer: Self-pay | Admitting: Orthopaedic Surgery

## 2022-09-10 MED ORDER — HYDROCODONE-ACETAMINOPHEN 5-325 MG PO TABS: 1.0000 | ORAL_TABLET | Freq: Every evening | ORAL | 0 refills | Status: DC | PRN

## 2022-09-10 NOTE — Telephone Encounter (Signed)
done 

## 2022-09-18 ENCOUNTER — Ambulatory Visit: Payer: Medicare HMO | Admitting: Neurology

## 2022-09-18 ENCOUNTER — Encounter: Payer: Self-pay | Admitting: Neurology

## 2022-09-18 VITALS — BP 141/78 | HR 63 | Ht 70.0 in | Wt 191.8 lb

## 2022-09-18 DIAGNOSIS — Z9989 Dependence on other enabling machines and devices: Secondary | ICD-10-CM

## 2022-09-18 DIAGNOSIS — G4733 Obstructive sleep apnea (adult) (pediatric): Secondary | ICD-10-CM | POA: Diagnosis not present

## 2022-09-18 DIAGNOSIS — G459 Transient cerebral ischemic attack, unspecified: Secondary | ICD-10-CM | POA: Diagnosis not present

## 2022-09-18 NOTE — Patient Instructions (Signed)

## 2022-09-18 NOTE — Progress Notes (Signed)
SLEEP MEDICINE CLINIC    Provider:  Larey Seat, MD  Primary Care Physician:  [name], MD [address] [place] [address]     Referring Provider: [name], [name],  [place] [id number]          Chief Complaint according to patient   Patient presents with:     New Patient (Initial Visit)           HISTORY OF PRESENT ILLNESS:  Caleb Rogers is a 72 y.o. male patient who is here for revisit 09/18/2022 for  CPAP compliance.   HPI:  Caleb Rogers is a 72 y.o. male  and  seen after he started therapy on a new device, 09-12-2021.  The patient showed noncompliance for the months of December and until mid January but then compliance dropped off and he is not quite sure why.  He uses the machine on average 5 hours at night but he only used CPAP for 75 out of 90 days which is an 83% compliance and only 43 days over 4 hours.  The residual AHI is 0.4 for this patient so he has excellent resolution of apnea the 95th percentile pressure is 9.6 cm water and there is no significant air leakage.  I like for him to continue daily CPAP use for at least 4 hours.  Extensions are bronchitis sinusitis. -and he tells me now that he actually was sick for a while in early February.  His Epworth Sleepiness Scale was endorsed at 8 out of 24 points his fatigue severity at 23 out of 63 points and his depression score was 0 out of 15. Fatigue, sleepiness - he had a hip replacement in February and developed anemia.  Now taking Flintstone iron multi mineral gummis.          Component Ref Range & Units 2 mo ago (06/21/22) 3 mo ago (06/12/22) 9 yr ago (09/16/13) 9 yr ago (09/16/13)  WBC 4.0 - 10.5 K/uL 9.3 6.9 7.3 6.8  RBC 4.22 - 5.81 MIL/uL 3.83 Low  4.76 4.98 4.75  Hemoglobin 13.0 - 17.0 g/dL 12.1 Low  14.6 15.7 14.9  HCT 39.0 - 52.0 % 33.2 Low  41.4 43.6 41.7  MCV 80.0 - 100.0 fL 86.7 87.0 87.6 R 87.8 R  MCH 26.0 - 34.0 pg 31.6 30.7 31.5 31.4  MCHC 30.0 - 36.0  g/dL 36.4 High  35.3 36.0 35.7  RDW 11.5 - 15.5 % 12.6 12.8 13.5 13.4  Platelets 150 - 400 K/uL 251 317 323 293  nRBC 0.0 - 0.2 % 0.0 0.0 CM           The patient underwent a retesting on 05-26-2021 and until that time he has used a large size [employer] nasal cradle and the patient will continue to use CPAP with an auto titration device.  Pressure settings were located between 5 through 12 cmH2O pressure was 2 cm EPR heated humidification. The patient was above average loud snoring in his sleep study.  His AHI was only 5.5 so he really had very mild apnea but he had several respiratory arousals or RDI's.  And these interrupted especially in dream sleep at a frequency of 20.9/h.  So we are basically treating a mild sleep apnea and a moderate severe upper respiratory resistance with CPAP at this time.  The patient has been 100% compliant with the new device, he has used the machine 6 hours and 31 minutes  nightly on average.  He is using an air sense 11 AutoSet by [employer] with a serial [id number].  Five 2000 m water 2 cm EPR residual AHI is 0.1/h.  95th percentile pressure was 8.8 cm water.  So this is an excellent resolution of her already mild apnea.   Epworth score endorsed at 5/ 24 points.     He was seen here upon referral  from [name] for a new Sleep evaluation. He was not seen for over 3 years and I have personally not seen him in a longer time.   I am seeing on 05-11-2021 this meanwhile 72 year-old  Caucasian, right-handed gentleman presents today upon request of his spouse.  He uses CPAP and his wife is happy he doesn't snore. His mask broke and he tried to fix it with duct tape and he is snoring again. CPAP pressure of 7 cm water . The patient underwent a sleep study at the time referred by [name], MD and in the sleep study tried to do a split-night protocol.  It was performed on 07-12-2013 the patient was diagnosed with an overall apnea index of 37.6/h was  moderately to loud snoring, his REM AHI was 67/h and his nonsupine AHI was surprisingly much higher than his AHI in supine sleep position.  Nadir of oxygen was 78% saturation but occurring during supine REM sleep there was 38 minutes of total desaturation time.  Heart rate was average 64 bpm.  CPAP corrected hypoxemia to a total desaturation time of 3.8 minutes, 7 cmH2O allowed for an AHI of 1.8.  Crescendo snoring was much less loud after treatment on CPAP .   He was initially placed on a nuance medium size nasal pillow mask initially but has meanwhile changed to a new nose mask.    Addendum : the patient brought his mask and headgear to the office after this visit., He uses a LARGE size Hardin Negus made [employer] nasal cradle.        Review of Systems: Out of a complete 14 system review, the patient complains of only the following symptoms, and all other reviewed systems are negative.:  Fatigue, sleepiness - he had a hip replacement in February and developed anemia.  Now taking Flintstone iron multi mineral gummis.    How likely are you to doze in the following situations: 0 = not likely, 1 = slight chance, 2 = moderate chance, 3 = high chance   Sitting and Reading? Watching Television? Sitting inactive in a public place (theater or meeting)? As a passenger in a car for an hour without a break? Lying down in the afternoon when circumstances permit? Sitting and talking to someone? Sitting quietly after lunch without alcohol? In a car, while stopped for a few minutes in traffic?   His Epworth Sleepiness Scale was endorsed at 8 out of 24 points his fatigue severity at 23 out of 63 points and his depression score was 0 out of 15.         Component Ref Range & Units 2 mo ago (06/21/22) 3 mo ago (06/12/22) 9 yr ago (09/16/13) 9 yr ago (09/16/13)  WBC 4.0 - 10.5 K/uL 9.3 6.9 7.3 6.8  RBC 4.22 - 5.81 MIL/uL 3.83 Low  4.76 4.98 4.75  Hemoglobin 13.0 - 17.0 g/dL 12.1 Low  14.6 15.7  14.9  HCT 39.0 - 52.0 % 33.2 Low  41.4 43.6 41.7  MCV 80.0 - 100.0 fL 86.7 87.0 87.6 R 87.8 R  MCH  26.0 - 34.0 pg 31.6 30.7 31.5 31.4  MCHC 30.0 - 36.0 g/dL 36.4 High  35.3 36.0 35.7  RDW 11.5 - 15.5 % 12.6 12.8 13.5 13.4  Platelets 150 - 400 K/uL 251 317 323 293  nRBC 0.0 - 0.2 % 0.0 0.0 CM       Social History   Socioeconomic History   Marital status: Married    Spouse name: Vicente Males   Number of children: 2   Years of education: college   Highest education level: Not on file  Occupational History   Occupation: Banker: Building surveyor FOR SELF EMPLOYED  Tobacco Use   Smoking status: Former    Types: Cigarettes    Quit date: 07/23/1968    Years since quitting: 54.1   Smokeless tobacco: Never   Tobacco comments:    pt quit July 1997  Vaping Use   Vaping Use: Never used  Substance and Sexual Activity   Alcohol use: Not Currently   Drug use: No   Sexual activity: Yes    Partners: Female  Other Topics Concern   Not on file  Social History Narrative   Patient is married Vicente Males) and lives at home with his wife.   Patient has two adult children.   Patient is working full-time.   Patient has a college education.   Patient is right- handed.   Patient drinks two cups of coffee daily.   Social Determinants of Health   Financial Resource Strain: Not on file  Food Insecurity: No Food Insecurity (06/20/2022)   Hunger Vital Sign    Worried About Running Out of Food in the Last Year: Never true    Ran Out of Food in the Last Year: Never true  Transportation Needs: No Transportation Needs (06/20/2022)   PRAPARE - Hydrologist (Medical): No    Lack of Transportation (Non-Medical): No  Physical Activity: Not on file  Stress: Not on file  Social Connections: Not on file    Family History  Problem Relation Age of Onset   Cancer Mother    Congestive Heart Failure Mother    Coronary artery disease Father    Coronary artery disease Brother  77    Past Medical History:  Diagnosis Date   Gait instability 08/23/2013   GERD (gastroesophageal reflux disease)    Hypertension    Impaired fasting glucose    OSA (obstructive sleep apnea) 02/24/2014   Pure hypercholesterolemia    Sleep apnea    TIA (transient ischemic attack)    2015    Past Surgical History:  Procedure Laterality Date   COLONOSCOPY     none     TEE WITHOUT CARDIOVERSION N/A 11/11/2014   Procedure: TRANSESOPHAGEAL ECHOCARDIOGRAM (TEE);  Surgeon: Adrian Prows, MD;  Location: [facility] ENDOSCOPY;  Service: Cardiovascular;  Laterality: N/A;  h/p in file drawer   TOTAL HIP ARTHROPLASTY Right 06/20/2022   Procedure: RIGHT TOTAL HIP ARTHROPLASTY ANTERIOR APPROACH;  Surgeon: Leandrew Koyanagi, MD;  Location: [place];  Service: Orthopedics;  Laterality: Right;  3-C     Current Outpatient Medications on File Prior to Visit  Medication Sig Dispense Refill   amoxicillin (AMOXIL) 500 MG capsule Take 4 pills one hour prior to dental work 8 capsule 2   aspirin EC 81 MG tablet Take 1 tablet (81 mg total) by mouth 2 (two) times daily. To be taken after surgery to prevent blood clots 84 tablet 0   docusate sodium (COLACE)  100 MG capsule Take 1 capsule (100 mg total) by mouth daily as needed. 30 capsule 2   ezetimibe (ZETIA) 10 MG tablet Take 1 tablet (10 mg total) by mouth daily. 30 tablet 0   HYDROcodone-acetaminophen (NORCO) 5-325 MG tablet Take 1 tablet by mouth at bedtime as needed. To be taken after surgery instead of percocet 20 tablet 0   MAGNESIUM PO Take 1 tablet by mouth daily.     methocarbamol (ROBAXIN-750) 750 MG tablet Take 1 tablet (750 mg total) by mouth 2 (two) times daily as needed for muscle spasms. 30 tablet 2   mupirocin ointment (BACTROBAN) 2 % Apply 1 Application topically 2 (two) times daily. 22 g 2   omeprazole (PRILOSEC) 40 MG capsule Take 40 mg by mouth daily.     ondansetron (ZOFRAN) 4 MG tablet Take 1 tablet (4 mg total) by mouth daily as needed for nausea or  vomiting. 30 tablet 1   POTASSIUM PO Take 1 tablet by mouth daily.     REPATHA SURECLICK [id number] MG/ML SOAJ Inject 140 mg into the skin every 14 (fourteen) days.     valsartan-hydrochlorothiazide (DIOVAN HCT) 160-25 MG tablet Take 1 tablet by mouth daily. 90 tablet 3   No current facility-administered medications on file prior to visit.    Allergies  Allergen Reactions   Atorvastatin Other (See Comments)    Other Reaction(s): leg pains   Other Other (See Comments)    Tree & scrubs     DIAGNOSTIC DATA (LABS, IMAGING, TESTING) - I reviewed patient records, labs, notes, testing and imaging myself where available.  Lab Results  Component Value Date   WBC 9.3 06/21/2022   HGB 12.1 (L) 06/21/2022   HCT 33.2 (L) 06/21/2022   MCV 86.7 06/21/2022   PLT 251 06/21/2022      Component Value Date/Time   NA 137 06/12/2022 1410   K 3.8 06/12/2022 1410   CL 101 06/12/2022 1410   CO2 25 06/12/2022 1410   GLUCOSE 101 (H) 06/12/2022 1410   BUN 16 06/12/2022 1410   CREATININE 0.96 06/12/2022 1410   CALCIUM 9.3 06/12/2022 1410   PROT 7.2 06/12/2022 1410   ALBUMIN 4.1 06/12/2022 1410   AST 24 06/12/2022 1410   ALT 26 06/12/2022 1410   ALKPHOS 65 06/12/2022 1410   BILITOT 0.6 06/12/2022 1410   GFRNONAA >60 06/12/2022 1410   GFRAA >90 09/16/2013 1438   Lab Results  Component Value Date   CHOL 213 (H) 09/17/2013   HDL 61 09/17/2013   LDLCALC 127 (H) 09/17/2013   TRIG 127 09/17/2013   CHOLHDL 3.5 09/17/2013   Lab Results  Component Value Date   HGBA1C 5.8 (H) 09/16/2013   Lab Results  Component Value Date   VITAMINB12 461 09/16/2013   No results found for: "TSH"  PHYSICAL EXAM:  Today's Vitals   09/18/22 1426  BP: (!) 141/78  Pulse: 63  Weight: 191 lb 12.8 oz (87 kg)  Height: '5\' 10"'$  (1.778 m)   Body mass index is 27.52 kg/m.   Wt Readings from Last 3 Encounters:  09/18/22 191 lb 12.8 oz (87 kg)  06/20/22 186 lb (84.4 kg)  06/12/22 191 lb 8 oz (86.9 kg)     Ht  Readings from Last 3 Encounters:  09/18/22 '5\' 10"'$  (1.778 m)  06/20/22 '5\' 10"'$  (1.778 m)  06/12/22 '5\' 10"'$  (1.778 m)      General: The patient is awake, alert and appears not in acute distress. The patient is  well groomed. Head: Normocephalic, atraumatic. Neck is supple. Mallampati 2-3, red and  elongated uvula,  no TMJ and no lateral restriction. , neck circumference: 15."   Cardiovascular:  Regular rate and rhythm, without  murmurs or carotid bruit, and without distended neck veins. Skin:  Without evidence of edema, or rash, facial redness.  Trunk: BMI is 26 , patient has normal posture.   Neurologic exam : The patient is awake and alert, oriented to place and time.   Memory subjective described as intact. There is a normal attention span & concentration ability.  Speech is fluent without  dysarthria, dysphonia or aphasia.  Mood and affect are appropriate, he is talkative. .   Cranial nerves: Pupils are equal and briskly reactive to light. Funduscopic exam without  evidence of pallor or edema. Extraocular movements  in vertical and horizontal planes intact and without nystagmus. Visual fields by finger perimetry are intact. Hearing to finger rub intact. Facial sensation intact to fine touch. Facial motor strength is symmetric and tongue and uvula move midline.   Motor exam:  Normal tone and normal muscle bulk and symmetric normal strength in all extremities. Symmetric strength.  Tandem gait unfragmented. Romberg testing is normal.   Deep tendon reflexes: in the upper and lower extremities are symmetric and intact.  Babinski maneuver response is downgoing.     ASSESSMENT AND PLAN 72 y.o. year old male  here with: OSA on CPAP. Recent hip surgery with dr Erlinda Hong.     1) fatigue - explained by postoperative anemia. Now on oral iron.   2) OSA- patient has been less complaint due to  frequent airway infection.  CPAP works.   3) sleepiness is well controlled under CPAP- he can continue at  current settings. RV in 12 months.    I plan to follow up either personally or through our NP within 12 months.   I would like to thank [name], MD and [name], [address],  [place] [address] for allowing me to meet with and to take care of this pleasant patient.   CC: I will share my notes with PCP.  After spending a total time of  20  minutes face to face and additional time for physical and neurologic examination, review of laboratory studies,  personal review of imaging studies, reports and results of other testing and review of referral information / records as far as provided in visit,   Electronically signed by: Larey Seat, MD 09/18/2022 3:05 PM  [facility] and [facility] Sleep Board certified by The [employer] of Sleep Medicine and Diplomate of the [employer] of Sleep Medicine. Board certified In Neurology through the [employer], Fellow of the [employer] of Neurology. Medical Director of [employer].

## 2022-10-11 ENCOUNTER — Encounter: Payer: Self-pay | Admitting: Orthopaedic Surgery

## 2022-10-11 ENCOUNTER — Other Ambulatory Visit: Payer: Self-pay | Admitting: Orthopaedic Surgery

## 2022-10-11 MED ORDER — HYDROCODONE-ACETAMINOPHEN 5-325 MG PO TABS: 1.0000 | ORAL_TABLET | Freq: Every evening | ORAL | 0 refills | Status: DC | PRN

## 2022-11-07 DIAGNOSIS — D649 Anemia, unspecified: Secondary | ICD-10-CM | POA: Diagnosis not present

## 2022-11-07 DIAGNOSIS — Z0000 Encounter for general adult medical examination without abnormal findings: Secondary | ICD-10-CM | POA: Diagnosis not present

## 2022-11-28 ENCOUNTER — Inpatient Hospital Stay: Payer: Medicare HMO

## 2022-11-28 ENCOUNTER — Encounter: Payer: Self-pay | Admitting: Physician Assistant

## 2022-11-28 ENCOUNTER — Inpatient Hospital Stay: Payer: Medicare HMO | Attending: Physician Assistant | Admitting: Physician Assistant

## 2022-11-28 VITALS — BP 139/77 | HR 64 | Temp 97.9°F | Resp 16 | Wt 195.1 lb

## 2022-11-28 DIAGNOSIS — Z803 Family history of malignant neoplasm of breast: Secondary | ICD-10-CM | POA: Insufficient documentation

## 2022-11-28 DIAGNOSIS — G4733 Obstructive sleep apnea (adult) (pediatric): Secondary | ICD-10-CM | POA: Diagnosis not present

## 2022-11-28 DIAGNOSIS — D649 Anemia, unspecified: Secondary | ICD-10-CM | POA: Insufficient documentation

## 2022-11-28 DIAGNOSIS — Z87891 Personal history of nicotine dependence: Secondary | ICD-10-CM | POA: Insufficient documentation

## 2022-11-28 DIAGNOSIS — K219 Gastro-esophageal reflux disease without esophagitis: Secondary | ICD-10-CM | POA: Insufficient documentation

## 2022-11-28 DIAGNOSIS — Z7982 Long term (current) use of aspirin: Secondary | ICD-10-CM | POA: Diagnosis not present

## 2022-11-28 DIAGNOSIS — D472 Monoclonal gammopathy: Secondary | ICD-10-CM

## 2022-11-28 DIAGNOSIS — Z79899 Other long term (current) drug therapy: Secondary | ICD-10-CM | POA: Insufficient documentation

## 2022-11-28 DIAGNOSIS — E7800 Pure hypercholesterolemia, unspecified: Secondary | ICD-10-CM | POA: Diagnosis not present

## 2022-11-28 DIAGNOSIS — Z8673 Personal history of transient ischemic attack (TIA), and cerebral infarction without residual deficits: Secondary | ICD-10-CM | POA: Diagnosis not present

## 2022-11-28 DIAGNOSIS — Z85828 Personal history of other malignant neoplasm of skin: Secondary | ICD-10-CM | POA: Diagnosis not present

## 2022-11-28 DIAGNOSIS — I10 Essential (primary) hypertension: Secondary | ICD-10-CM | POA: Diagnosis not present

## 2022-11-28 LAB — CMP (CANCER CENTER ONLY)
ALT: 19 U/L (ref 0–44)
AST: 17 U/L (ref 15–41)
Albumin: 4.1 g/dL (ref 3.5–5.0)
Alkaline Phosphatase: 97 U/L (ref 38–126)
Anion gap: 6 (ref 5–15)
BUN: 16 mg/dL (ref 8–23)
CO2: 29 mmol/L (ref 22–32)
Calcium: 9.1 mg/dL (ref 8.9–10.3)
Chloride: 99 mmol/L (ref 98–111)
Creatinine: 0.74 mg/dL (ref 0.61–1.24)
GFR, Estimated: >60 mL/min (ref >60)
Glucose, Bld: 101 mg/dL — ABNORMAL HIGH (ref 70–99)
Potassium: 3.5 mmol/L (ref 3.5–5.1)
Sodium: 134 mmol/L — ABNORMAL LOW (ref 135–145)
Total Bilirubin: 0.4 mg/dL (ref 0.3–1.2)
Total Protein: 7.6 g/dL (ref 6.5–8.1)

## 2022-11-28 LAB — CBC WITH DIFFERENTIAL (CANCER CENTER ONLY)
Abs Immature Granulocytes: 0.01 10*3/uL (ref 0.00–0.07)
Basophils Absolute: 0 10*3/uL (ref 0.0–0.1)
Basophils Relative: 1 %
Eosinophils Absolute: 0.2 10*3/uL (ref 0.0–0.5)
Eosinophils Relative: 3 %
HCT: 34.3 % — ABNORMAL LOW (ref 39.0–52.0)
Hemoglobin: 11.5 g/dL — ABNORMAL LOW (ref 13.0–17.0)
Immature Granulocytes: 0 %
Lymphocytes Relative: 29 %
Lymphs Abs: 1.6 10*3/uL (ref 0.7–4.0)
MCH: 28.1 pg (ref 26.0–34.0)
MCHC: 33.5 g/dL (ref 30.0–36.0)
MCV: 83.9 fL (ref 80.0–100.0)
Monocytes Absolute: 0.6 10*3/uL (ref 0.1–1.0)
Monocytes Relative: 10 %
Neutro Abs: 3.2 10*3/uL (ref 1.7–7.7)
Neutrophils Relative %: 57 %
Platelet Count: 407 10*3/uL — ABNORMAL HIGH (ref 150–400)
RBC: 4.09 MIL/uL — ABNORMAL LOW (ref 4.22–5.81)
RDW: 13.7 % (ref 11.5–15.5)
WBC Count: 5.5 10*3/uL (ref 4.0–10.5)
nRBC: 0 % (ref 0.0–0.2)

## 2022-11-28 LAB — IRON AND IRON BINDING CAPACITY (CC-WL,HP ONLY)
Iron: 50 ug/dL (ref 45–182)
Saturation Ratios: 17 % — ABNORMAL LOW (ref 17.9–39.5)
TIBC: 300 ug/dL (ref 250–450)
UIBC: 250 ug/dL (ref 117–376)

## 2022-11-28 LAB — FOLATE: Folate: 21.9 ng/mL (ref >5.9)

## 2022-11-28 LAB — VITAMIN B12: Vitamin B-12: 412 pg/mL (ref 180–914)

## 2022-11-28 NOTE — Progress Notes (Unsigned)
[facility] [phone]   [phone]  INITIAL CONSULT NOTE  Patient Care Team: Rodrigo Ran, MD as PCP - General (Internal Medicine)  Hematological/Oncological History 08/06/2022: WBC 7.01, Hgb 11.4 (L), MCV 86.0, Plt 519 (H) 08/29/2022: WBC 8.96, Hgb 11.6 (L), MCV 82.9,Plt 342, Creatinine 0.8, Calcium 8.9.  11/07/2022: SPEP did not detect monoclonal protein but polyclonal increase. Urine protein electrophoresis did detect M-spike measuring 32.8% with IFE that showed bence jones proteins; lambda type. Iron saturation 13% (L), Ferritin 88, TIBC 282, Iron 38, vitamin B12 381.  11/28/2022: Establish care with [facility] Hematology  CHIEF COMPLAINTS/PURPOSE OF CONSULTATION:  Monoclonal gammopathy Normocytic anemia  HISTORY OF PRESENTING ILLNESS:  Caleb Rogers 72 y.o. male with medical history significant for GERD, hypertension, OSA, hypercholesterolemia, TIA who presents to the hematology clinic for evaluation of monoclonal gammopathy and normocytic anemia. He is accompanied by his wife for this visit.   On exam today, Caleb Rogers has noticed a decline in his energy levels over the last few months especially after he underwent hip replacement end of November 2023. His wife adds that he was a very active person and now requires resting during the day. His appetite and weight are stable. He denies nausea, vomiting or abdominal pain. His bowel habits are regular without recurrent episodes of diarrhea or constipation. He denies easy bruising or signs of bleeding. He denies any new bone or back pain. He does have some discomfort from his right hip after surgery which is improving. He denies fevers, chills, sweats, shortness of breath, chest pain or cough. He has no other complaints. Rest of the ROS is below.   MEDICAL HISTORY:  Past Medical History:  Diagnosis Date   Gait instability 08/23/2013   GERD (gastroesophageal reflux disease)    Hypertension    Impaired  fasting glucose    OSA (obstructive sleep apnea) 02/24/2014   Pure hypercholesterolemia    Sleep apnea    TIA (transient ischemic attack)    2015    SURGICAL HISTORY: Past Surgical History:  Procedure Laterality Date   COLONOSCOPY     none     TEE WITHOUT CARDIOVERSION N/A 11/11/2014   Procedure: TRANSESOPHAGEAL ECHOCARDIOGRAM (TEE);  Surgeon: Yates Decamp, MD;  Location: [facility] ENDOSCOPY;  Service: Cardiovascular;  Laterality: N/A;  h/p in file drawer   TOTAL HIP ARTHROPLASTY Right 06/20/2022   Procedure: RIGHT TOTAL HIP ARTHROPLASTY ANTERIOR APPROACH;  Surgeon: Tarry Kos, MD;  Location: [facility] OR;  Service: Orthopedics;  Laterality: Right;  3-C    SOCIAL HISTORY: Social History   Socioeconomic History   Marital status: Married    Spouse name: Caleb Rogers   Number of children: 2   Years of education: college   Highest education level: Not on file  Occupational History   Occupation: Chief Strategy Officer: Production assistant, 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   Patient has two adult children.  Patient is working full-time.   Patient has a college education.   Patient is right- handed.   Patient drinks two cups of coffee daily.   Social Determinants of Health   Financial Resource Strain: Not on file  Food Insecurity: No Food Insecurity (06/20/2022)   Hunger Vital Sign    Worried About Running Out of Food in the Last Year: Never true    Ran Out of Food in the Last Year: Never true  Transportation Needs: No Transportation Needs (06/20/2022)    PRAPARE - Administrator, Civil Service (Medical): No    Lack of Transportation (Non-Medical): No  Physical Activity: Not on file  Stress: Not on file  Social Connections: Not on file  Intimate Partner Violence: Not At Risk (06/20/2022)   Humiliation, Afraid, Rape, and Kick questionnaire    Fear of Current or Ex-Partner: No    Emotionally Abused: No    Physically Abused: No    Sexually Abused: No    FAMILY HISTORY: Family History  Problem Relation Age of Onset   Cancer Mother    Congestive Heart Failure Mother    Coronary artery disease Father    Coronary artery disease Brother 32    ALLERGIES:  is allergic to atorvastatin and other.  MEDICATIONS:  Current Outpatient Medications  Medication Sig Dispense Refill   amoxicillin (AMOXIL) 500 MG capsule Take 4 pills one hour prior to dental work 8 capsule 2   aspirin EC 81 MG tablet Take 1 tablet (81 mg total) by mouth 2 (two) times daily. To be taken after surgery to prevent blood clots 84 tablet 0   docusate sodium (COLACE) 100 MG capsule Take 1 capsule (100 mg total) by mouth daily as needed. 30 capsule 2   ezetimibe (ZETIA) 10 MG tablet Take 1 tablet (10 mg total) by mouth daily. 30 tablet 0   HYDROcodone-acetaminophen (NORCO) 5-325 MG tablet Take 1 tablet by mouth at bedtime as needed. 10 tablet 0   MAGNESIUM PO Take 1 tablet by mouth daily.     methocarbamol (ROBAXIN-750) 750 MG tablet Take 1 tablet (750 mg total) by mouth 2 (two) times daily as needed for muscle spasms. 30 tablet 2   mupirocin ointment (BACTROBAN) 2 % Apply 1 Application topically 2 (two) times daily. 22 g 2   omeprazole (PRILOSEC) 40 MG capsule Take 40 mg by mouth daily.     POTASSIUM PO Take 1 tablet by mouth daily.     REPATHA SURECLICK 140 MG/ML SOAJ Inject 140 mg into the skin every 14 (fourteen) days.     valsartan-hydrochlorothiazide (DIOVAN HCT) 160-25 MG tablet Take 1 tablet by mouth daily. 90 tablet 3   ondansetron (ZOFRAN) 4 MG tablet  Take 1 tablet (4 mg total) by mouth daily as needed for nausea or vomiting. (Patient not taking: Reported on 11/28/2022) 30 tablet 1   No current facility-administered medications for this visit.    REVIEW OF SYSTEMS:   Constitutional: ( - ) fevers, ( - )  chills , ( - ) night sweats Eyes: ( - ) blurriness of vision, ( - ) double vision, ( - ) watery eyes Ears, nose, mouth, throat, and face: ( - ) mucositis, ( - ) sore throat Respiratory: ( - ) cough, ( - ) dyspnea, ( - ) wheezes Cardiovascular: ( - ) palpitation, ( - ) chest discomfort, ( - ) lower extremity swelling Gastrointestinal:  ( - ) nausea, ( - ) heartburn, ( - ) change  in bowel habits Skin: ( - ) abnormal skin rashes Lymphatics: ( - ) new lymphadenopathy, ( - ) easy bruising Neurological: ( - ) numbness, ( - ) tingling, ( - ) new weaknesses Behavioral/Psych: ( - ) mood change, ( - ) new changes  All other systems were reviewed with the patient and are negative.  PHYSICAL EXAMINATION: ECOG PERFORMANCE STATUS: 1 - Symptomatic but completely ambulatory  Vitals:   11/28/22 1433  BP: 139/77  Pulse: 64  Resp: 16  Temp: 97.9 F (36.6 C)  SpO2: 93%   Filed Weights   11/28/22 1433  Weight: 195 lb 1.6 oz (88.5 kg)    GENERAL: well appearing male in NAD  SKIN: skin color, texture, turgor are normal, no rashes or significant lesions EYES: conjunctiva are pink and non-injected, sclera clear OROPHARYNX: no exudate, no erythema; lips, buccal mucosa, and tongue normal  NECK: supple, non-tender LYMPH:  no palpable lymphadenopathy in the cervical or supraclavicular lymph nodes.  LUNGS: clear to auscultation and percussion with normal breathing effort HEART: regular rate & rhythm and no murmurs and no lower extremity edema ABDOMEN: soft, non-tender, non-distended, normal bowel sounds Musculoskeletal: no cyanosis of digits and no clubbing  PSYCH: alert & oriented x 3, fluent speech NEURO: no focal motor/sensory  deficits  LABORATORY DATA:  I have reviewed the data as listed    Latest Ref Rng & Units 06/21/2022    6:10 AM 06/12/2022    2:30 PM 09/16/2013    2:38 PM  CBC  WBC 4.0 - 10.5 K/uL 9.3  6.9  7.3   Hemoglobin 13.0 - 17.0 g/dL 16.1  09.6  04.5   Hematocrit 39.0 - 52.0 % 33.2  41.4  43.6   Platelets 150 - 400 K/uL 251  317  323        Latest Ref Rng & Units 06/12/2022    2:10 PM 09/16/2013    2:38 PM 09/16/2013    7:30 AM  CMP  Glucose 70 - 99 mg/dL 409   811   BUN 8 - 23 mg/dL 16   21   Creatinine 9.14 - 1.24 mg/dL 7.82  9.56  2.13   Sodium 135 - 145 mmol/L 137   142   Potassium 3.5 - 5.1 mmol/L 3.8   4.1   Chloride 98 - 111 mmol/L 101   104   CO2 22 - 32 mmol/L 25   23   Calcium 8.9 - 10.3 mg/dL 9.3   9.4   Total Protein 6.5 - 8.1 g/dL 7.2   7.5   Total Bilirubin 0.3 - 1.2 mg/dL 0.6   0.3   Alkaline Phos 38 - 126 U/L 65   84   AST 15 - 41 U/L 24   31   ALT 0 - 44 U/L 26   30    ASSESSMENT & PLAN Eryn Stallins is a 72 y.o. male who presents to the hematology clinic due to monoclonal gammopathy and normocytic anemia.  We discussed the recommended workup for monoclonal gammopathy including serologic workup, urinary evaluation and skeletal survey. Based on the initial workup, role for bone marrow biopsy will be determined. Patient expressed understanding of the plan provided and would like to move forward with the workup.   #Monoclonal protein in urine: --Outside UPEP from 11/07/2022 detected M-spike measuring 32.8% with IFE that showed bence jones proteins; lambda type. --SPEP did not detect M protein --Recommend complete workup with multiple myeloma panel, serum free light chains, CBC, CMP. Will  need 24 hour UPEP and bone met survey --Determine if bone marrow biopsy based on above workup --RTC once workup is complete  #Normocytic anemia: --Outside labs from 11/07/2022 did show low iron saturation and borderline B12 levels --Patient is currently taking vitamin B12  supplementation and flinstone vitamin fortified with iron for the last 6 weeks.  --Repeat iron panel, vitamin B12 levels along with folate and MMA levels   No orders of the defined types were placed in this encounter.   All questions were answered. The patient knows to call the clinic with any problems, questions or concerns.  I have spent a total of 60 minutes minutes of face-to-face and non-face-to-face time, preparing to see the patient, obtaining and/or reviewing separately obtained history, performing a medically appropriate examination, counseling and educating the patient, ordering tests/procedures, documenting clinical information in the electronic health record,  and care coordination.   Georga Kaufmann, PA-C Department of Hematology/Oncology [facility] Phone: [phone]

## 2022-11-29 LAB — KAPPA/LAMBDA LIGHT CHAINS
Kappa free light chain: 29.3 mg/L — ABNORMAL HIGH (ref 3.3–19.4)
Kappa, lambda light chain ratio: 0.23 — ABNORMAL LOW (ref 0.26–1.65)
Lambda free light chains: 127.9 mg/L — ABNORMAL HIGH (ref 5.7–26.3)

## 2022-11-29 LAB — FERRITIN: Ferritin: 51 ng/mL (ref 24–336)

## 2022-11-30 ENCOUNTER — Ambulatory Visit (HOSPITAL_COMMUNITY)
Admission: RE | Admit: 2022-11-30 | Discharge: 2022-11-30 | Disposition: A | Discharge status: Home / Self Care | Payer: Medicare HMO | Source: Ambulatory Visit | Attending: Physician Assistant | Admitting: Physician Assistant

## 2022-11-30 DIAGNOSIS — D472 Monoclonal gammopathy: Secondary | ICD-10-CM | POA: Diagnosis not present

## 2022-12-03 DIAGNOSIS — D649 Anemia, unspecified: Secondary | ICD-10-CM | POA: Diagnosis not present

## 2022-12-03 DIAGNOSIS — G4733 Obstructive sleep apnea (adult) (pediatric): Secondary | ICD-10-CM | POA: Diagnosis not present

## 2022-12-03 DIAGNOSIS — K219 Gastro-esophageal reflux disease without esophagitis: Secondary | ICD-10-CM | POA: Diagnosis not present

## 2022-12-03 DIAGNOSIS — Z803 Family history of malignant neoplasm of breast: Secondary | ICD-10-CM | POA: Diagnosis not present

## 2022-12-03 DIAGNOSIS — Z8673 Personal history of transient ischemic attack (TIA), and cerebral infarction without residual deficits: Secondary | ICD-10-CM | POA: Diagnosis not present

## 2022-12-03 DIAGNOSIS — Z79899 Other long term (current) drug therapy: Secondary | ICD-10-CM | POA: Diagnosis not present

## 2022-12-03 DIAGNOSIS — Z85828 Personal history of other malignant neoplasm of skin: Secondary | ICD-10-CM | POA: Diagnosis not present

## 2022-12-03 DIAGNOSIS — E7800 Pure hypercholesterolemia, unspecified: Secondary | ICD-10-CM | POA: Diagnosis not present

## 2022-12-03 DIAGNOSIS — Z7982 Long term (current) use of aspirin: Secondary | ICD-10-CM | POA: Diagnosis not present

## 2022-12-03 DIAGNOSIS — I10 Essential (primary) hypertension: Secondary | ICD-10-CM | POA: Diagnosis not present

## 2022-12-03 DIAGNOSIS — Z87891 Personal history of nicotine dependence: Secondary | ICD-10-CM | POA: Diagnosis not present

## 2022-12-03 DIAGNOSIS — D472 Monoclonal gammopathy: Secondary | ICD-10-CM | POA: Diagnosis not present

## 2022-12-03 LAB — METHYLMALONIC ACID, SERUM: Methylmalonic Acid, Quantitative: 92 nmol/L (ref 0–378)

## 2022-12-04 LAB — MULTIPLE MYELOMA PANEL, SERUM
Albumin SerPl Elph-Mcnc: 3.4 g/dL (ref 2.9–4.4)
Albumin/Glob SerPl: 1 (ref 0.7–1.7)
Alpha 1: 0.3 g/dL (ref 0.0–0.4)
Alpha2 Glob SerPl Elph-Mcnc: 0.9 g/dL (ref 0.4–1.0)
B-Globulin SerPl Elph-Mcnc: 1.1 g/dL (ref 0.7–1.3)
Gamma Glob SerPl Elph-Mcnc: 1.2 g/dL (ref 0.4–1.8)
Globulin, Total: 3.5 g/dL (ref 2.2–3.9)
IgA: 422 mg/dL (ref 61–437)
IgG (Immunoglobin G), Serum: 1306 mg/dL (ref 603–1613)
IgM (Immunoglobulin M), Srm: 58 mg/dL (ref 15–143)
Total Protein ELP: 6.9 g/dL (ref 6.0–8.5)

## 2022-12-06 LAB — UPEP/UIFE/LIGHT CHAINS/TP, 24-HR UR
% BETA, Urine: 20.8 %
ALPHA 1 URINE: 4.6 %
Albumin, U: 8.8 %
Alpha 2, Urine: 2.1 %
Free Kappa Lt Chains,Ur: 40.04 mg/L (ref 1.17–86.46)
Free Kappa/Lambda Ratio: 0.5 — ABNORMAL LOW (ref 1.83–14.26)
Free Lambda Lt Chains,Ur: 80.64 mg/L — ABNORMAL HIGH (ref 0.27–15.21)
GAMMA GLOBULIN URINE: 63.6 %
M-SPIKE %, Urine: 25.7 % — ABNORMAL HIGH
M-Spike, Mg/24 Hr: 41 mg/24 hr — ABNORMAL HIGH
Total Protein, Urine-Ur/day: 159 mg/24 hr — ABNORMAL HIGH (ref 30–150)
Total Protein, Urine: 6.9 mg/dL
Total Volume: 2300

## 2022-12-07 ENCOUNTER — Telehealth: Payer: Self-pay | Admitting: Physician Assistant

## 2022-12-10 ENCOUNTER — Inpatient Hospital Stay (HOSPITAL_BASED_OUTPATIENT_CLINIC_OR_DEPARTMENT_OTHER): Payer: Medicare HMO | Admitting: Physician Assistant

## 2022-12-10 DIAGNOSIS — D472 Monoclonal gammopathy: Secondary | ICD-10-CM | POA: Diagnosis not present

## 2022-12-10 DIAGNOSIS — E611 Iron deficiency: Secondary | ICD-10-CM

## 2022-12-10 DIAGNOSIS — D649 Anemia, unspecified: Secondary | ICD-10-CM | POA: Diagnosis not present

## 2022-12-10 NOTE — Progress Notes (Signed)
[facility] [phone]   [phone]  VIRTUAL NOTE  Patient Care Team: Rodrigo Ran, MD as PCP - General (Internal Medicine)   I connected with Dannielle Burn  on 12/10/22 by telephone visit and verified that I am speaking with the correct person using two identifiers.   I discussed the limitations, risks, security and privacy concerns of performing an evaluation and management service by telemedicine and the availability of in-person appointments. I also discussed with the patient that there may be a patient responsible charge related to this service. The patient expressed understanding and agreed to proceed.  Other persons participating in the visit and their role in the encounter: Bertha Zanin, patient's spouse.   Patient's location: Home Provider's location: Office  Hematological/Oncological History 08/06/2022: WBC 7.01, Hgb 11.4 (L), MCV 86.0, Plt 519 (H) 08/29/2022: WBC 8.96, Hgb 11.6 (L), MCV 82.9,Plt 342, Creatinine 0.8, Calcium 8.9.  11/07/2022: SPEP did not detect monoclonal protein but polyclonal increase. Urine protein electrophoresis did detect M-spike measuring 32.8% with IFE that showed bence jones proteins; lambda type. Iron saturation 13% (L), Ferritin 88, TIBC 282, Iron 38, vitamin B12 381.  11/28/2022: Establish care with [facility] Hematology  CHIEF COMPLAINTS/PURPOSE OF CONSULTATION:  Monoclonal gammopathy Normocytic anemia  HISTORY OF PRESENTING ILLNESS:  Mathews Mow 72 y.o. male returns for a follow up for monoclonal gammopathy and normocytic anemia.   Mr. Venia Carbon overall is feeling well without any changes to his health since our first visit. His energy and appetite are stable. He denies any GI symptoms including nausea, vomiting, diarrhea or constipation. He denies fevers, chills ,sweats, shortness of breath, chest pain or cough. He has no other complaints. Rest of the ROS is below.   A majority of the visit was focused on review  the lab results from 11/28/2022 and next steps in his workup.   MEDICAL HISTORY:  Past Medical History:  Diagnosis Date   Gait instability 08/23/2013   GERD (gastroesophageal reflux disease)    Hypertension    Impaired fasting glucose    OSA (obstructive sleep apnea) 02/24/2014   Pure hypercholesterolemia    Skin cancer of face    not melanoma   Sleep apnea    TIA (transient ischemic attack)    2015    SURGICAL HISTORY: Past Surgical History:  Procedure Laterality Date   COLONOSCOPY     none     TEE WITHOUT CARDIOVERSION N/A 11/11/2014   Procedure: TRANSESOPHAGEAL ECHOCARDIOGRAM (TEE);  Surgeon: Yates Decamp, MD;  Location: [facility] ENDOSCOPY;  Service: Cardiovascular;  Laterality: N/A;  h/p in file drawer   TOTAL HIP ARTHROPLASTY Right 06/20/2022   Procedure: RIGHT TOTAL HIP ARTHROPLASTY ANTERIOR APPROACH;  Surgeon: Tarry Kos, MD;  Location: [facility] OR;  Service: Orthopedics;  Laterality: Right;  3-C    SOCIAL HISTORY: Social History   Socioeconomic History   Marital status: Married    Spouse name: [name]   Number of children: 2   Years of education: college   Highest education level: Not on file  Occupational History   Occupation: Chief Strategy Officer: Production assistant, radio FOR SELF EMPLOYED  Tobacco Use   Smoking status: Former    Years: 5    Types: Cigarettes    Quit date: 07/23/1968    Years since quitting: 54.4   Smokeless tobacco: Never   Tobacco comments:    pt quit July 1997  Vaping Use   Vaping Use: Never used  Substance and Sexual Activity   Alcohol use:  Not Currently   Drug use: No   Sexual activity: Yes    Partners: Female  Other Topics Concern   Not on file  Social History Narrative   Patient is married [name]) and lives at home with his wife.   Patient has two adult children.   Patient is working full-time.   Patient has a college education.   Patient is right- handed.   Patient drinks two cups of coffee daily.   Social Determinants of Health   Financial  Resource Strain: Not on file  Food Insecurity: No Food Insecurity (06/20/2022)   Hunger Vital Sign    Worried About Running Out of Food in the Last Year: Never true    Ran Out of Food in the Last Year: Never true  Transportation Needs: No Transportation Needs (06/20/2022)   PRAPARE - Administrator, Civil Service (Medical): No    Lack of Transportation (Non-Medical): No  Physical Activity: Not on file  Stress: Not on file  Social Connections: Not on file  Intimate Partner Violence: Not At Risk (06/20/2022)   Humiliation, Afraid, Rape, and Kick questionnaire    Fear of Current or Ex-Partner: No    Emotionally Abused: No    Physically Abused: No    Sexually Abused: No    FAMILY HISTORY: Family History  Problem Relation Age of Onset   Breast cancer Mother        after age 66   Congestive Heart Failure Mother    Coronary artery disease Father    Coronary artery disease Brother 29    ALLERGIES:  is allergic to atorvastatin and other.  MEDICATIONS:  Current Outpatient Medications  Medication Sig Dispense Refill   amoxicillin (AMOXIL) 500 MG capsule Take 4 pills one hour prior to dental work 8 capsule 2   aspirin EC 81 MG tablet Take 1 tablet (81 mg total) by mouth 2 (two) times daily. To be taken after surgery to prevent blood clots 84 tablet 0   docusate sodium (COLACE) 100 MG capsule Take 1 capsule (100 mg total) by mouth daily as needed. 30 capsule 2   ezetimibe (ZETIA) 10 MG tablet Take 1 tablet (10 mg total) by mouth daily. 30 tablet 0   HYDROcodone-acetaminophen (NORCO) 5-325 MG tablet Take 1 tablet by mouth at bedtime as needed. 10 tablet 0   MAGNESIUM PO Take 1 tablet by mouth daily.     methocarbamol (ROBAXIN-750) 750 MG tablet Take 1 tablet (750 mg total) by mouth 2 (two) times daily as needed for muscle spasms. 30 tablet 2   mupirocin ointment (BACTROBAN) 2 % Apply 1 Application topically 2 (two) times daily. 22 g 2   omeprazole (PRILOSEC) 40 MG capsule Take  40 mg by mouth daily.     POTASSIUM PO Take 1 tablet by mouth daily.     REPATHA SURECLICK 140 MG/ML SOAJ Inject 140 mg into the skin every 14 (fourteen) days.     valsartan-hydrochlorothiazide (DIOVAN HCT) 160-25 MG tablet Take 1 tablet by mouth daily. 90 tablet 3   No current facility-administered medications for this visit.    REVIEW OF SYSTEMS:   Constitutional: ( - ) fevers, ( - )  chills , ( - ) night sweats Eyes: ( - ) blurriness of vision, ( - ) double vision, ( - ) watery eyes Ears, nose, mouth, throat, and face: ( - ) mucositis, ( - ) sore throat Respiratory: ( - ) cough, ( - ) dyspnea, ( - )  wheezes Cardiovascular: ( - ) palpitation, ( - ) chest discomfort, ( - ) lower extremity swelling Gastrointestinal:  ( - ) nausea, ( - ) heartburn, ( - ) change in bowel habits Skin: ( - ) abnormal skin rashes Lymphatics: ( - ) new lymphadenopathy, ( - ) easy bruising Neurological: ( - ) numbness, ( - ) tingling, ( - ) new weaknesses Behavioral/Psych: ( - ) mood change, ( - ) new changes  All other systems were reviewed with the patient and are negative.  PHYSICAL EXAMINATION: Not performed due to telehealth visit.   LABORATORY DATA:  I have reviewed the data as listed    Latest Ref Rng & Units 11/28/2022    3:31 PM 06/21/2022    6:10 AM 06/12/2022    2:30 PM  CBC  WBC 4.0 - 10.5 K/uL 5.5  9.3  6.9   Hemoglobin 13.0 - 17.0 g/dL 16.1  09.6  04.5   Hematocrit 39.0 - 52.0 % 34.3  33.2  41.4   Platelets 150 - 400 K/uL 407  251  317        Latest Ref Rng & Units 11/28/2022    3:31 PM 06/12/2022    2:10 PM 09/16/2013    2:38 PM  CMP  Glucose 70 - 99 mg/dL 409  811    BUN 8 - 23 mg/dL 16  16    Creatinine 9.14 - 1.24 mg/dL 7.82  9.56  2.13   Sodium 135 - 145 mmol/L 134  137    Potassium 3.5 - 5.1 mmol/L 3.5  3.8    Chloride 98 - 111 mmol/L 99  101    CO2 22 - 32 mmol/L 29  25    Calcium 8.9 - 10.3 mg/dL 9.1  9.3    Total Protein 6.5 - 8.1 g/dL 7.6  7.2    Total Bilirubin 0.3 -  1.2 mg/dL 0.4  0.6    Alkaline Phos 38 - 126 U/L 97  65    AST 15 - 41 U/L 17  24    ALT 0 - 44 U/L 19  26     ASSESSMENT & PLAN Caleb Rogers is a 72 y.o. male who returns for a follow up for monoclonal gammopathy and normocytic anemia.  #Lambda light chain monoclonal gammopathy: --Outside UPEP from 11/07/2022 detected M-spike measuring 32.8% with IFE that showed bence jones proteins; lambda type.SPEP did not detect M protein --Labs from 11/28/2022 were reviewed with the patient. SPEP/IFE did not detect a monoclonal protein. Serum free light chains showed elevated lambda light chain of 127.9, Kappa light chain of 29.3 and ratio of 0.23. CBC showed persistent anemia with Hgb 11.5, Plt 407K. CMP did not show renal dysfunction or hypercalcemia.  --24 hour UPEP from 12/03/2022 did detect monoclonal protein measuring 41 mg/24 hr. IFE showed bence jones proteins, lambda type --Bone met survey from 11/30/2022 did not show any lytic lesions or pathologic fractures --Recommend a bone marrow biopsy due to abnormal serum free light chains in the setting of anemia. Currently scheduled for 12/18/2022 --Will schedule a follow up with Dr. Leonides Schanz on 12/25/2022 to review biopsy results and finalize recommendations.  #Normocytic anemia: --Outside labs from 11/07/2022 did show low iron saturation and borderline B12 levels --Patient is currently taking vitamin B12 supplementation and flinstone vitamin fortified with iron for the last 6 weeks.  --Labs from 11/28/2022 showed no evidence of vitamin B12 or folate deficiency. There is still borderline iron deficiency with saturation of 17%. Recommend  to increase OTC supplementation of iron.   No orders of the defined types were placed in this encounter.   All questions were answered. The patient knows to call the clinic with any problems, questions or concerns.  I have spent a total of 25 minutes minutes of non-face-to-face time, preparing to see the patient, counseling and  educating the patient, ordering tests/procedures, documenting clinical information in the electronic health record,  and care coordination.   Georga Kaufmann, PA-C Department of Hematology/Oncology [facility] Phone: [phone]

## 2022-12-11 ENCOUNTER — Telehealth: Payer: Self-pay | Admitting: Physician Assistant

## 2022-12-14 ENCOUNTER — Telehealth: Payer: Self-pay | Admitting: Hematology and Oncology

## 2022-12-14 ENCOUNTER — Other Ambulatory Visit: Payer: Self-pay | Admitting: Internal Medicine

## 2022-12-14 DIAGNOSIS — D472 Monoclonal gammopathy: Secondary | ICD-10-CM

## 2022-12-14 NOTE — Consult Note (Signed)
Chief Complaint: Patient was seen in consultation today for CT-guided bone marrow biopsy  Referring Physician(s): Dorsey,J  Supervising Physician: Richarda Overlie  Patient Status: [facility] - Out-pt  History of Present Illness: Caleb Rogers is a 72 y.o. male with past medical history significant for GERD, hypertension, sleep apnea, hyperlipidemia, skin cancer, TIA 2015 who presents now with monoclonal gammopathy and normocytic anemia as well as abnormal serum free light chains.  He is scheduled today for CT-guided bone marrow biopsy for further evaluation.  Past Medical History:  Diagnosis Date   Gait instability 08/23/2013   GERD (gastroesophageal reflux disease)    Hypertension    Impaired fasting glucose    OSA (obstructive sleep apnea) 02/24/2014   Pure hypercholesterolemia    Skin cancer of face    not melanoma   Sleep apnea    TIA (transient ischemic attack)    2015    Past Surgical History:  Procedure Laterality Date   COLONOSCOPY     none     TEE WITHOUT CARDIOVERSION N/A 11/11/2014   Procedure: TRANSESOPHAGEAL ECHOCARDIOGRAM (TEE);  Surgeon: Yates Decamp, MD;  Location: [facility] ENDOSCOPY;  Service: Cardiovascular;  Laterality: N/A;  h/p in file drawer   TOTAL HIP ARTHROPLASTY Right 06/20/2022   Procedure: RIGHT TOTAL HIP ARTHROPLASTY ANTERIOR APPROACH;  Surgeon: Tarry Kos, MD;  Location: [facility] OR;  Service: Orthopedics;  Laterality: Right;  3-C    Allergies: Atorvastatin and Other  Medications: Prior to Admission medications   Medication Sig Start Date End Date Taking? Authorizing Provider  amoxicillin (AMOXIL) 500 MG capsule Take 4 pills one hour prior to dental work 08/28/22   Cristie Hem, PA-C  aspirin EC 81 MG tablet Take 1 tablet (81 mg total) by mouth 2 (two) times daily. To be taken after surgery to prevent blood clots 06/18/22 06/18/23  Cristie Hem, PA-C  docusate sodium (COLACE) 100 MG capsule Take 1 capsule (100 mg total) by mouth daily as needed.  06/18/22 06/18/23  Cristie Hem, PA-C  ezetimibe (ZETIA) 10 MG tablet Take 1 tablet (10 mg total) by mouth daily. 05/11/21   Dohmeier, Porfirio Mylar, MD  HYDROcodone-acetaminophen (NORCO) 5-325 MG tablet Take 1 tablet by mouth at bedtime as needed. 10/11/22   Tarry Kos, MD  MAGNESIUM PO Take 1 tablet by mouth daily.    [provider]  methocarbamol (ROBAXIN-750) 750 MG tablet Take 1 tablet (750 mg total) by mouth 2 (two) times daily as needed for muscle spasms. 06/18/22   Cristie Hem, PA-C  mupirocin ointment (BACTROBAN) 2 % Apply 1 Application topically 2 (two) times daily. 08/01/22   Tarry Kos, MD  omeprazole (PRILOSEC) 40 MG capsule Take 40 mg by mouth daily. 08/07/21   [provider]  POTASSIUM PO Take 1 tablet by mouth daily.    [provider]  REPATHA SURECLICK 140 MG/ML SOAJ Inject 140 mg into the skin every 14 (fourteen) days. 05/05/21   [provider]  valsartan-hydrochlorothiazide (DIOVAN HCT) 160-25 MG tablet Take 1 tablet by mouth daily. 06/12/22   Nori Riis, NP     Family History  Problem Relation Age of Onset   Breast cancer Mother        after age 30   Congestive Heart Failure Mother    Coronary artery disease Father    Coronary artery disease Brother 36    Social History   Socioeconomic History   Marital status: Married    Spouse name: [name]   Number  of children: 2   Years of education: college   Highest education level: Not on file  Occupational History   Occupation: Chief Strategy Officer: Production assistant, radio FOR SELF EMPLOYED  Tobacco Use   Smoking status: Former    Years: 5    Types: Cigarettes    Quit date: 07/23/1968    Years since quitting: 54.4   Smokeless tobacco: Never   Tobacco comments:    pt quit July 1997  Vaping Use   Vaping Use: Never used  Substance and Sexual Activity   Alcohol use: Not Currently   Drug use: No   Sexual activity: Yes    Partners: Female  Other Topics Concern   Not on file   Social History Narrative   Patient is married [name]) and lives at home with his wife.   Patient has two adult children.   Patient is working full-time.   Patient has a college education.   Patient is right- handed.   Patient drinks two cups of coffee daily.   Social Determinants of Health   Financial Resource Strain: Not on file  Food Insecurity: No Food Insecurity (06/20/2022)   Hunger Vital Sign    Worried About Running Out of Food in the Last Year: Never true    Ran Out of Food in the Last Year: Never true  Transportation Needs: No Transportation Needs (06/20/2022)   PRAPARE - Administrator, Civil Service (Medical): No    Lack of Transportation (Non-Medical): No  Physical Activity: Not on file  Stress: Not on file  Social Connections: Not on file       Review of Systems  Vital Signs:  Code Status:  Advance Care Plan: {Advance Care UEAV:40981}    Physical Exam  Imaging: DG Bone Survey Met  Result Date: 12/06/2022 CLINICAL DATA:  Patient with MGUS.  Check for lytic lesions. EXAM: METASTATIC BONE SURVEY COMPARISON:  No prior similar study. There is a chest CT without contrast available for comparison from 09/20/2021, AP pelvis 06/20/2022 FINDINGS: Single left lateral skull view is unremarkable. In the cervical spine there is osteopenia, moderate degenerative discs C3-4 through C7-T1, bidirectional osteophytes and grade 1 degenerative listhesis at C3-4 and C4-5. There are no visible lytic lesions in the cervical, thoracic and lumbar spine. No spinal compression fractures. There are mild degenerative changes and slight dextroscoliosis of the thoracic spine. There are moderate degenerative features in the lumbar spine, slight levoscoliosis and grade 1 discogenic retrolisthesis at L2-3 and L3-4. PA chest again demonstrate scattered calcified pleural plaquing in both hemithoraces. The ribcage and clavicles are unremarkable apart from osteopenia. The lungs are clear  of infiltrates. The mediastinal silhouette is normal. The cardiac size is normal. Evaluation of the scapulae and both upper extremities reveals no lytic lesions. The bone mineralization is more normal in the upper extremities. Single AP pelvis demonstrates no fractures or focal lesions with again noted right hip total joint replacement. Both lower extremities show no appreciable fractures or suspicious lesions. Soft tissues are unremarkable. There are artifacts from overlying clothing. Electronically Signed   By: Almira Bar M.D.   On: 12/06/2022 01:53    Labs:  CBC: Recent Labs    06/12/22 1430 06/21/22 0610 11/28/22 1531  WBC 6.9 9.3 5.5  HGB 14.6 12.1* 11.5*  HCT 41.4 33.2* 34.3*  PLT 317 251 407*    COAGS: No results for input(s): "INR", "APTT" in the last 8760 hours.  BMP: Recent Labs    06/12/22  1410 11/28/22 1531  NA 137 134*  K 3.8 3.5  CL 101 99  CO2 25 29  GLUCOSE 101* 101*  BUN 16 16  CALCIUM 9.3 9.1  CREATININE 0.96 0.74  GFRNONAA >60 >60    LIVER FUNCTION TESTS: Recent Labs    06/12/22 1410 11/28/22 1531  BILITOT 0.6 0.4  AST 24 17  ALT 26 19  ALKPHOS 65 97  PROT 7.2 7.6  ALBUMIN 4.1 4.1    TUMOR MARKERS: No results for input(s): "AFPTM", "CEA", "CA199", "CHROMGRNA" in the last 8760 hours.  Assessment and Plan: 72 y.o. male with past medical history significant for GERD, hypertension, sleep apnea, hyperlipidemia, skin cancer, TIA 2015 who presents now with monoclonal gammopathy and normocytic anemia as well as abnormal serum free light chains.  He is scheduled today for CT-guided bone marrow biopsy for further evaluation.Risks and benefits of procedure was discussed with the patient including, but not limited to bleeding, infection, damage to adjacent structures or low yield requiring additional tests.  All of the questions were answered and there is agreement to proceed.  Consent signed and in chart.    Thank you for this interesting  consult.  I greatly enjoyed meeting Jafet Mcfeaters and look forward to participating in their care.  A copy of this report was sent to the requesting provider on this date.  Electronically Signed: D. Jeananne Rama, PA-C 12/14/2022, 3:16 PM   I spent a total of   20 minutes  in face to face in clinical consultation, greater than 50% of which was counseling/coordinating care for CT-guided bone marrow biopsy

## 2022-12-18 ENCOUNTER — Ambulatory Visit (HOSPITAL_COMMUNITY)
Admission: RE | Admit: 2022-12-18 | Discharge: 2022-12-18 | Disposition: A | Discharge status: Home / Self Care | Payer: Medicare HMO | Source: Ambulatory Visit | Attending: Physician Assistant | Admitting: Physician Assistant

## 2022-12-18 ENCOUNTER — Encounter (HOSPITAL_COMMUNITY): Payer: Self-pay

## 2022-12-18 DIAGNOSIS — K219 Gastro-esophageal reflux disease without esophagitis: Secondary | ICD-10-CM | POA: Insufficient documentation

## 2022-12-18 DIAGNOSIS — Z87891 Personal history of nicotine dependence: Secondary | ICD-10-CM | POA: Insufficient documentation

## 2022-12-18 DIAGNOSIS — I10 Essential (primary) hypertension: Secondary | ICD-10-CM | POA: Insufficient documentation

## 2022-12-18 DIAGNOSIS — Z8673 Personal history of transient ischemic attack (TIA), and cerebral infarction without residual deficits: Secondary | ICD-10-CM | POA: Insufficient documentation

## 2022-12-18 DIAGNOSIS — G4733 Obstructive sleep apnea (adult) (pediatric): Secondary | ICD-10-CM | POA: Insufficient documentation

## 2022-12-18 DIAGNOSIS — Z8249 Family history of ischemic heart disease and other diseases of the circulatory system: Secondary | ICD-10-CM | POA: Diagnosis not present

## 2022-12-18 DIAGNOSIS — D472 Monoclonal gammopathy: Secondary | ICD-10-CM | POA: Insufficient documentation

## 2022-12-18 DIAGNOSIS — D6489 Other specified anemias: Secondary | ICD-10-CM | POA: Diagnosis not present

## 2022-12-18 DIAGNOSIS — Z85828 Personal history of other malignant neoplasm of skin: Secondary | ICD-10-CM | POA: Diagnosis not present

## 2022-12-18 DIAGNOSIS — D649 Anemia, unspecified: Secondary | ICD-10-CM | POA: Insufficient documentation

## 2022-12-18 LAB — CBC WITH DIFFERENTIAL/PLATELET
Abs Immature Granulocytes: 0.01 10*3/uL (ref 0.00–0.07)
Basophils Absolute: 0 10*3/uL (ref 0.0–0.1)
Basophils Relative: 1 %
Eosinophils Absolute: 0.3 10*3/uL (ref 0.0–0.5)
Eosinophils Relative: 4 %
HCT: 36.5 % — ABNORMAL LOW (ref 39.0–52.0)
Hemoglobin: 12.1 g/dL — ABNORMAL LOW (ref 13.0–17.0)
Immature Granulocytes: 0 %
Lymphocytes Relative: 28 %
Lymphs Abs: 1.9 10*3/uL (ref 0.7–4.0)
MCH: 28.7 pg (ref 26.0–34.0)
MCHC: 33.2 g/dL (ref 30.0–36.0)
MCV: 86.7 fL (ref 80.0–100.0)
Monocytes Absolute: 0.8 10*3/uL (ref 0.1–1.0)
Monocytes Relative: 11 %
Neutro Abs: 3.9 10*3/uL (ref 1.7–7.7)
Neutrophils Relative %: 56 %
Platelets: 361 10*3/uL (ref 150–400)
RBC: 4.21 MIL/uL — ABNORMAL LOW (ref 4.22–5.81)
RDW: 13.8 % (ref 11.5–15.5)
WBC: 6.8 10*3/uL (ref 4.0–10.5)
nRBC: 0 % (ref 0.0–0.2)

## 2022-12-18 MED ORDER — MIDAZOLAM HCL 2 MG/2ML IJ SOLN
INTRAMUSCULAR | Status: AC
  Filled 2022-12-18: qty 4

## 2022-12-18 MED ORDER — SODIUM CHLORIDE 0.9 % IV SOLN: INTRAVENOUS | Status: DC

## 2022-12-18 MED ORDER — FENTANYL CITRATE (PF) 100 MCG/2ML IJ SOLN
INTRAMUSCULAR | Status: AC
  Filled 2022-12-18: qty 2

## 2022-12-18 MED ORDER — LIDOCAINE HCL (PF) 1 % IJ SOLN
INTRAMUSCULAR | Status: AC | PRN
  Administered 2022-12-18: 20 mL

## 2022-12-18 MED ORDER — FENTANYL CITRATE (PF) 100 MCG/2ML IJ SOLN
INTRAMUSCULAR | Status: AC | PRN
  Administered 2022-12-18 (×2): 50 ug via INTRAVENOUS

## 2022-12-18 MED ORDER — MIDAZOLAM HCL 2 MG/2ML IJ SOLN
INTRAMUSCULAR | Status: AC | PRN
  Administered 2022-12-18 (×4): 1 mg via INTRAVENOUS

## 2022-12-18 NOTE — Discharge Instructions (Addendum)
Bone Marrow Aspiration and Bone Marrow Biopsy, Adult, Care After  The following information offers guidance on how to care for yourself after your procedure. Your health care provider may also give you more specific instructions. If you have problems or questions, contact your health care provider.  What can I expect after the procedure?  May remove dressing or bandaid and shower tomorrow.  Keep site clean and dry. Replace with clean dressing or bandaid as necessary. Urgent needs IR clinic [phone] (mon-fri 8-5).  After the procedure, it is common to have: Mild pain and tenderness. Swelling. Bruising. Follow these instructions at home: Incision care  Follow instructions from your health care provider about how to take care of the incision site. Make sure you: Wash your hands with soap and water for at least 20 seconds before and after you change your bandage (dressing). If soap and water are not available, use hand sanitizer. Change your dressing as told by your health care provider. Leave stitches (sutures), skin glue, or adhesive strips in place. These skin closures may need to stay in place for 2 weeks or longer. If adhesive strip edges start to loosen and curl up, you may trim the loose edges. Do not remove adhesive strips completely unless your health care provider tells you to do that. Check your incision site every day for signs of infection. Check for: More redness, swelling, or pain. Fluid or blood. Warmth. Pus or a bad smell. Activity Return to your normal activities as told by your health care provider. Ask your health care provider what activities are safe for you. Do not lift anything that is heavier than 10 lb (4.5 kg), or the limit that you are told, until your health care provider says that it is safe. If you were given a sedative during the procedure, it can affect you for  several hours. Do not drive or operate machinery until your health care provider says that it is safe. General instructions  Take over-the-counter and prescription medicines only as told by your health care provider. Do not take baths, swim, or use a hot tub until your health care provider approves. Ask your health care provider if you may take showers. You may only be allowed to take sponge baths. If directed, put ice on the affected area. To do this: Put ice in a plastic bag. Place a towel between your skin and the bag. Leave the ice on for 20 minutes, 2-3 times a day. If your skin turns bright red, remove the ice right away to prevent skin damage. The risk of skin damage is higher if you cannot feel pain, heat, or cold. Contact a health care provider if: You have signs of infection. Your pain is not controlled with medicine. You have cancer, and a temperature of 100.4F (38C) or higher. Get help right away if: You have a temperature of 101F (38.3C) or higher, or as told by your health care provider. You have bleeding from the incision site that cannot be controlled. This information is not intended to replace advice given to you by your health care provider. Make sure you discuss any questions you have with your health care provider. Document Revised: 11/13/2021 Document Reviewed: 11/13/2021 Elsevier Patient Education  2023 Elsevier Inc.                            Moderate Conscious Sedation, Adult, Care After  This sheet gives you information about how to care   for yourself after your procedure. Your health care provider may also give you more specific instructions. If you have problems or questions, contact your health care provider. What can I expect after the procedure? After the procedure, it is common to have: Sleepiness for several hours. Impaired judgment for several hours. Difficulty with balance. Vomiting if you eat too soon. Follow these instructions at home: For  the time period you were told by your health care provider:     Rest. Do not participate in activities where you could fall or become injured. Do not drive or use machinery. Do not drink alcohol. Do not take sleeping pills or medicines that cause drowsiness. Do not make important decisions or sign legal documents. Do not take care of children on your own. Eating and drinking  Follow the diet recommended by your health care provider. Drink enough fluid to keep your urine pale yellow. If you vomit: Drink water, juice, or soup when you can drink without vomiting. Make sure you have little or no nausea before eating solid foods. General instructions Take over-the-counter and prescription medicines only as told by your health care provider. Have a responsible adult stay with you for the time you are told. It is important to have someone help care for you until you are awake and alert. Do not smoke. Keep all follow-up visits as told by your health care provider. This is important. Contact a health care provider if: You are still sleepy or having trouble with balance after 24 hours. You feel light-headed. You keep feeling nauseous or you keep vomiting. You develop a rash. You have a fever. You have redness or swelling around the IV site. Get help right away if: You have trouble breathing. You have new-onset confusion at home. Summary After the procedure, it is common to feel sleepy, have impaired judgment, or feel nauseous if you eat too soon. Rest after you get home. Know the things you should not do after the procedure. Follow the diet recommended by your health care provider and drink enough fluid to keep your urine pale yellow. Get help right away if you have trouble breathing or new-onset confusion at home. This information is not intended to replace advice given to you by your health care provider. Make sure you discuss any questions you have with your health care  provider. Document Revised: 11/06/2019 Document Reviewed: 06/04/2019 Elsevier Patient Education  2023 Elsevier Inc.  

## 2022-12-18 NOTE — Procedures (Signed)
Interventional Radiology Procedure:   Indications: Monoclonal gammopathy, normocytic anemia   Procedure: CT guided bone marrow biopsy  Findings: 2 aspirates and 2 cores from right ilium  Complications: None     EBL: Minimal, less than 10 ml  Plan: Discharge to home in one hour.   Gearldene Fiorenza R. Lowella Dandy, MD  Pager: [phone]

## 2022-12-21 LAB — SURGICAL PATHOLOGY

## 2022-12-25 ENCOUNTER — Other Ambulatory Visit: Payer: Self-pay

## 2022-12-25 ENCOUNTER — Inpatient Hospital Stay: Payer: Medicare HMO | Admitting: Hematology and Oncology

## 2022-12-25 ENCOUNTER — Inpatient Hospital Stay: Payer: Medicare HMO | Attending: Physician Assistant

## 2022-12-25 VITALS — BP 129/82 | HR 57 | Temp 97.5°F | Resp 16 | Wt 192.8 lb

## 2022-12-25 DIAGNOSIS — I10 Essential (primary) hypertension: Secondary | ICD-10-CM | POA: Diagnosis not present

## 2022-12-25 DIAGNOSIS — E7800 Pure hypercholesterolemia, unspecified: Secondary | ICD-10-CM | POA: Insufficient documentation

## 2022-12-25 DIAGNOSIS — Z7982 Long term (current) use of aspirin: Secondary | ICD-10-CM | POA: Diagnosis not present

## 2022-12-25 DIAGNOSIS — Z8673 Personal history of transient ischemic attack (TIA), and cerebral infarction without residual deficits: Secondary | ICD-10-CM | POA: Diagnosis not present

## 2022-12-25 DIAGNOSIS — Z803 Family history of malignant neoplasm of breast: Secondary | ICD-10-CM | POA: Diagnosis not present

## 2022-12-25 DIAGNOSIS — D509 Iron deficiency anemia, unspecified: Secondary | ICD-10-CM | POA: Diagnosis not present

## 2022-12-25 DIAGNOSIS — K219 Gastro-esophageal reflux disease without esophagitis: Secondary | ICD-10-CM | POA: Diagnosis not present

## 2022-12-25 DIAGNOSIS — R5383 Other fatigue: Secondary | ICD-10-CM

## 2022-12-25 DIAGNOSIS — G4733 Obstructive sleep apnea (adult) (pediatric): Secondary | ICD-10-CM | POA: Diagnosis not present

## 2022-12-25 DIAGNOSIS — Z87891 Personal history of nicotine dependence: Secondary | ICD-10-CM | POA: Insufficient documentation

## 2022-12-25 DIAGNOSIS — Z79899 Other long term (current) drug therapy: Secondary | ICD-10-CM | POA: Insufficient documentation

## 2022-12-25 DIAGNOSIS — D649 Anemia, unspecified: Secondary | ICD-10-CM

## 2022-12-25 DIAGNOSIS — D472 Monoclonal gammopathy: Secondary | ICD-10-CM | POA: Diagnosis not present

## 2022-12-25 DIAGNOSIS — Z85828 Personal history of other malignant neoplasm of skin: Secondary | ICD-10-CM | POA: Diagnosis not present

## 2022-12-25 LAB — CMP (CANCER CENTER ONLY)
ALT: 26 U/L (ref 0–44)
AST: 20 U/L (ref 15–41)
Albumin: 3.9 g/dL (ref 3.5–5.0)
Alkaline Phosphatase: 85 U/L (ref 38–126)
Anion gap: 6 (ref 5–15)
BUN: 17 mg/dL (ref 8–23)
CO2: 28 mmol/L (ref 22–32)
Calcium: 9.2 mg/dL (ref 8.9–10.3)
Chloride: 103 mmol/L (ref 98–111)
Creatinine: 0.76 mg/dL (ref 0.61–1.24)
GFR, Estimated: >60 mL/min (ref >60)
Glucose, Bld: 100 mg/dL — ABNORMAL HIGH (ref 70–99)
Potassium: 4 mmol/L (ref 3.5–5.1)
Sodium: 137 mmol/L (ref 135–145)
Total Bilirubin: 0.5 mg/dL (ref 0.3–1.2)
Total Protein: 7.6 g/dL (ref 6.5–8.1)

## 2022-12-25 LAB — CBC WITH DIFFERENTIAL (CANCER CENTER ONLY)
Abs Immature Granulocytes: 0.01 10*3/uL (ref 0.00–0.07)
Basophils Absolute: 0.1 10*3/uL (ref 0.0–0.1)
Basophils Relative: 1 %
Eosinophils Absolute: 0.2 10*3/uL (ref 0.0–0.5)
Eosinophils Relative: 4 %
HCT: 35.9 % — ABNORMAL LOW (ref 39.0–52.0)
Hemoglobin: 12.2 g/dL — ABNORMAL LOW (ref 13.0–17.0)
Immature Granulocytes: 0 %
Lymphocytes Relative: 36 %
Lymphs Abs: 1.6 10*3/uL (ref 0.7–4.0)
MCH: 28.5 pg (ref 26.0–34.0)
MCHC: 34 g/dL (ref 30.0–36.0)
MCV: 83.9 fL (ref 80.0–100.0)
Monocytes Absolute: 0.5 10*3/uL (ref 0.1–1.0)
Monocytes Relative: 11 %
Neutro Abs: 2.2 10*3/uL (ref 1.7–7.7)
Neutrophils Relative %: 48 %
Platelet Count: 385 10*3/uL (ref 150–400)
RBC: 4.28 MIL/uL (ref 4.22–5.81)
RDW: 14 % (ref 11.5–15.5)
WBC Count: 4.5 10*3/uL (ref 4.0–10.5)
nRBC: 0 % (ref 0.0–0.2)

## 2022-12-25 LAB — IRON AND IRON BINDING CAPACITY (CC-WL,HP ONLY)
Iron: 65 ug/dL (ref 45–182)
Saturation Ratios: 22 % (ref 17.9–39.5)
TIBC: 301 ug/dL (ref 250–450)
UIBC: 236 ug/dL (ref 117–376)

## 2022-12-25 LAB — TSH: TSH: 1.354 u[IU]/mL (ref 0.350–4.500)

## 2022-12-25 LAB — FERRITIN: Ferritin: 48 ng/mL (ref 24–336)

## 2022-12-25 NOTE — Progress Notes (Signed)
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  11/07/2022: SPEP did not detect monoclonal protein but polyclonal increase. Urine protein electrophoresis did detect M-spike measuring 32.8% with IFE that showed bence jones proteins; lambda type. Iron saturation 13% (L), Ferritin 88, TIBC 282, Iron 38, vitamin B12 381.  11/28/2022: Establish care with [facility]. Kappa 29.3, Lambda 127.9, Ratio 0.23. UPEP showed Bence Jones Protein.  12/18/2022: bone marrow biopsy showed normocellular bone marrow (30%) with mildly increased plasma cells (9% by manual aspirate differential, and up to 10% by CD138  immunohistochemical analysis), and lambda predominance by kappa/lambda  in situ hybridization.   Interval History:  Caleb Rogers 72 y.o. male with medical history significant for free lambda light chain MGUS who presents for a follow up visit. The patient's last visit was on 12/10/2022 with Georga Kaufmann. In the interim since the last visit he underwent a bone marrow biopsy which confirmed the diagnosis of MGUS.   On exam today Caleb Rogers is accompanied by his wife.  He reports the bone marrow biopsy procedure "went well".  He notes that he was well sedated and did not have any residual pain or soreness after the procedure.  He also notes he is not having any bone or back pain.  Overall he feels well with no questions concerns or complaints.  He reports no fevers, chills, sweats, nausea, vomiting or diarrhea.  A full 10 point ROS was otherwise negative.  The bulk of our discussion focused on the results of the bone marrow biopsy and steps  moving forward.  We discussed MGUS is a disease process.  The patient voices understanding of our plan moving forward.  MEDICAL HISTORY:  Past Medical History:  Diagnosis Date   Gait instability 08/23/2013   GERD (gastroesophageal reflux disease)    Hypertension    Impaired fasting glucose    OSA (obstructive sleep apnea) 02/24/2014   Pure hypercholesterolemia    Skin cancer of face    not melanoma   Sleep apnea    TIA (transient ischemic attack)    2015    SURGICAL HISTORY: Past Surgical History:  Procedure Laterality Date   COLONOSCOPY     none     TEE WITHOUT CARDIOVERSION N/A 11/11/2014   Procedure: TRANSESOPHAGEAL ECHOCARDIOGRAM (TEE);  Surgeon: Yates Decamp, MD;  Location: [facility] ENDOSCOPY;  Service: Cardiovascular;  Laterality: N/A;  h/p in file drawer   TOTAL HIP ARTHROPLASTY Right 06/20/2022   Procedure: RIGHT TOTAL HIP ARTHROPLASTY ANTERIOR APPROACH;  Surgeon: Tarry Kos, MD;  Location: [facility] OR;  Service: Orthopedics;  Laterality: Right;  3-C    SOCIAL HISTORY: Social History   Socioeconomic History   Marital status: Married    Spouse name: [name]   Number of children: 2   Years of education: college   Highest education level: Not on file  Occupational History   Occupation: Chief Strategy Officer: Production assistant, radio FOR SELF EMPLOYED  Tobacco Use   Smoking status: Former    Years: 5    Types: Cigarettes    Quit date: 07/23/1968    Years since quitting: 54.4   Smokeless tobacco: Never   Tobacco comments:  pt quit July 1997  Vaping Use   Vaping Use: Never used  Substance and Sexual Activity   Alcohol use: Not Currently   Drug use: No   Sexual activity: Yes    Partners: Female  Other Topics Concern   Not on file  Social History Narrative   Patient is married [name]) and lives at home with his wife.   Patient has two adult children.   Patient is working full-time.   Patient has a college education.   Patient is right- handed.   Patient drinks two cups of coffee  daily.   Social Determinants of Health   Financial Resource Strain: Not on file  Food Insecurity: No Food Insecurity (06/20/2022)   Hunger Vital Sign    Worried About Running Out of Food in the Last Year: Never true    Ran Out of Food in the Last Year: Never true  Transportation Needs: No Transportation Needs (06/20/2022)   PRAPARE - Administrator, Civil Service (Medical): No    Lack of Transportation (Non-Medical): No  Physical Activity: Not on file  Stress: Not on file  Social Connections: Not on file  Intimate Partner Violence: Not At Risk (06/20/2022)   Humiliation, Afraid, Rape, and Kick questionnaire    Fear of Current or Ex-Partner: No    Emotionally Abused: No    Physically Abused: No    Sexually Abused: No    FAMILY HISTORY: Family History  Problem Relation Age of Onset   Breast cancer Mother        after age 75   Congestive Heart Failure Mother    Coronary artery disease Father    Coronary artery disease Brother 76    ALLERGIES:  is allergic to atorvastatin and other.  MEDICATIONS:  Current Outpatient Medications  Medication Sig Dispense Refill   amoxicillin (AMOXIL) 500 MG capsule Take 4 pills one hour prior to dental work 8 capsule 2   aspirin EC 81 MG tablet Take 1 tablet (81 mg total) by mouth 2 (two) times daily. To be taken after surgery to prevent blood clots 84 tablet 0   docusate sodium (COLACE) 100 MG capsule Take 1 capsule (100 mg total) by mouth daily as needed. 30 capsule 2   ezetimibe (ZETIA) 10 MG tablet Take 1 tablet (10 mg total) by mouth daily. 30 tablet 0   HYDROcodone-acetaminophen (NORCO) 5-325 MG tablet Take 1 tablet by mouth at bedtime as needed. 10 tablet 0   MAGNESIUM PO Take 1 tablet by mouth daily.     methocarbamol (ROBAXIN-750) 750 MG tablet Take 1 tablet (750 mg total) by mouth 2 (two) times daily as needed for muscle spasms. 30 tablet 2   mupirocin ointment (BACTROBAN) 2 % Apply 1 Application topically 2 (two) times  daily. 22 g 2   omeprazole (PRILOSEC) 40 MG capsule Take 40 mg by mouth daily.     POTASSIUM PO Take 1 tablet by mouth daily.     REPATHA SURECLICK 140 MG/ML SOAJ Inject 140 mg into the skin every 14 (fourteen) days.     valsartan-hydrochlorothiazide (DIOVAN HCT) 160-25 MG tablet Take 1 tablet by mouth daily. 90 tablet 3   No current facility-administered medications for this visit.    REVIEW OF SYSTEMS:   Constitutional: ( - ) fevers, ( - )  chills , ( - ) night sweats Eyes: ( - ) blurriness of vision, ( - ) double vision, ( - ) watery eyes Ears, nose, mouth, throat, and  face: ( - ) mucositis, ( - ) sore throat Respiratory: ( - ) cough, ( - ) dyspnea, ( - ) wheezes Cardiovascular: ( - ) palpitation, ( - ) chest discomfort, ( - ) lower extremity swelling Gastrointestinal:  ( - ) nausea, ( - ) heartburn, ( - ) change in bowel habits Skin: ( - ) abnormal skin rashes Lymphatics: ( - ) new lymphadenopathy, ( - ) easy bruising Neurological: ( - ) numbness, ( - ) tingling, ( - ) new weaknesses Behavioral/Psych: ( - ) mood change, ( - ) new changes  All other systems were reviewed with the patient and are negative.  PHYSICAL EXAMINATION: Vitals:   12/25/22 1045  BP: 129/82  Pulse: (!) 57  Resp: 16  Temp: (!) 97.5 F (36.4 C)  SpO2: 95%   Filed Weights   12/25/22 1045  Weight: 192 lb 12.8 oz (87.5 kg)    GENERAL: Well-appearing elderly Caucasian male, alert, no distress and comfortable SKIN: skin color, texture, turgor are normal, no rashes or significant lesions EYES: conjunctiva are pink and non-injected, sclera clear LUNGS: clear to auscultation and percussion with normal breathing effort HEART: regular rate & rhythm and no murmurs and no lower extremity edema Musculoskeletal: no cyanosis of digits and no clubbing  PSYCH: alert & oriented x 3, fluent speech NEURO: no focal motor/sensory deficits  LABORATORY DATA:  I have reviewed the data as listed    Latest Ref Rng &  Units 12/25/2022   10:02 AM 12/18/2022    8:28 AM 11/28/2022    3:31 PM  CBC  WBC 4.0 - 10.5 K/uL 4.5  6.8  5.5   Hemoglobin 13.0 - 17.0 g/dL 16.1  09.6  04.5   Hematocrit 39.0 - 52.0 % 35.9  36.5  34.3   Platelets 150 - 400 K/uL 385  361  407        Latest Ref Rng & Units 12/25/2022   10:02 AM 11/28/2022    3:31 PM 06/12/2022    2:10 PM  CMP  Glucose 70 - 99 mg/dL 409  811  914   BUN 8 - 23 mg/dL 17  16  16    Creatinine 0.61 - 1.24 mg/dL 7.82  9.56  2.13   Sodium 135 - 145 mmol/L 137  134  137   Potassium 3.5 - 5.1 mmol/L 4.0  3.5  3.8   Chloride 98 - 111 mmol/L 103  99  101   CO2 22 - 32 mmol/L 28  29  25    Calcium 8.9 - 10.3 mg/dL 9.2  9.1  9.3   Total Protein 6.5 - 8.1 g/dL 7.6  7.6  7.2   Total Bilirubin 0.3 - 1.2 mg/dL 0.5  0.4  0.6   Alkaline Phos 38 - 126 U/L 85  97  65   AST 15 - 41 U/L 20  17  24    ALT 0 - 44 U/L 26  19  26      Lab Results  Component Value Date   MPROTEIN Not Observed 11/28/2022   Lab Results  Component Value Date   KPAFRELGTCHN 29.3 (H) 11/28/2022   LAMBDASER 127.9 (H) 11/28/2022   KAPLAMBRATIO 0.50 (L) 12/03/2022   KAPLAMBRATIO 0.23 (L) 11/28/2022    RADIOGRAPHIC STUDIES: CT BONE MARROW BIOPSY & ASPIRATION  Result Date: 12/18/2022 INDICATION: Monoclonal gammopathy. EXAM: CT GUIDED BONE MARROW ASPIRATES AND BIOPSY Physician: Rachelle Hora. Lowella Dandy, MD MEDICATIONS: None. ANESTHESIA/SEDATION: Moderate (conscious) sedation was employed during this procedure. A total  of Versed 4mg  and fentanyl 100 mcg was administered intravenously at the order of the provider performing the procedure. Total intra-service moderate sedation time: 12 minutes. Patient's level of consciousness and vital signs were monitored continuously by radiology nurse throughout the procedure under the supervision of the provider performing the procedure. COMPLICATIONS: None immediate. PROCEDURE: The procedure was explained to the patient. The risks and benefits of the procedure were discussed and  the patient's questions were addressed. Informed consent was obtained from the patient. The patient was placed prone on CT table. Images of the pelvis were obtained. The right side of back was prepped and draped in sterile fashion. The skin and right posterior ilium were anesthetized with 1% lidocaine. 11 gauge bone needle was directed into the right ilium with CT guidance. Two aspirates and two core biopsies were obtained. Bandage placed over the puncture site. RADIATION DOSE REDUCTION: This exam was performed according to the departmental dose-optimization program which includes automated exposure control, adjustment of the mA and/or kV according to patient size and/or use of iterative reconstruction technique. FINDINGS: Biopsy needle confirmed in the posterior right ilium. IMPRESSION: CT guided bone marrow aspiration and core biopsy. Electronically Signed   By: Richarda Overlie M.D.   On: 12/18/2022 12:14   DG Bone Survey Met  Result Date: 12/06/2022 CLINICAL DATA:  Patient with MGUS.  Check for lytic lesions. EXAM: METASTATIC BONE SURVEY COMPARISON:  No prior similar study. There is a chest CT without contrast available for comparison from 09/20/2021, AP pelvis 06/20/2022 FINDINGS: Single left lateral skull view is unremarkable. In the cervical spine there is osteopenia, moderate degenerative discs C3-4 through C7-T1, bidirectional osteophytes and grade 1 degenerative listhesis at C3-4 and C4-5. There are no visible lytic lesions in the cervical, thoracic and lumbar spine. No spinal compression fractures. There are mild degenerative changes and slight dextroscoliosis of the thoracic spine. There are moderate degenerative features in the lumbar spine, slight levoscoliosis and grade 1 discogenic retrolisthesis at L2-3 and L3-4. PA chest again demonstrate scattered calcified pleural plaquing in both hemithoraces. The ribcage and clavicles are unremarkable apart from osteopenia. The lungs are clear of infiltrates. The  mediastinal silhouette is normal. The cardiac size is normal. Evaluation of the scapulae and both upper extremities reveals no lytic lesions. The bone mineralization is more normal in the upper extremities. Single AP pelvis demonstrates no fractures or focal lesions with again noted right hip total joint replacement. Both lower extremities show no appreciable fractures or suspicious lesions. Soft tissues are unremarkable. There are artifacts from overlying clothing. Electronically Signed   By: Almira Bar M.D.   On: 12/06/2022 01:53    ASSESSMENT & PLAN Caleb Rogers 72 y.o. male with medical history significant for free lambda light chain MGUS who presents for a follow up visit.  #Lambda light chain monoclonal gammopathy: --Outside UPEP from 11/07/2022 detected M-spike measuring 32.8% with IFE that showed bence jones proteins; lambda type.SPEP did not detect M protein --Labs from 11/28/2022 were reviewed with the patient. SPEP/IFE did not detect a monoclonal protein. Serum free light chains showed elevated lambda light chain of 127.9, Kappa light chain of 29.3 and ratio of 0.23. CBC showed persistent anemia with Hgb 11.5, Plt 407K. CMP did not show renal dysfunction or hypercalcemia.  --24 hour UPEP from 12/03/2022 did detect monoclonal protein measuring 41 mg/24 hr. IFE showed bence jones proteins, lambda type --Bone met survey from 11/30/2022 did not show any lytic lesions or pathologic fractures -- bone marrow biopsy showed  9-10% plasma cells.  PLAN: --labs today show white blood cell 4.5, hemoglobin 12.2, MCV 83.9, and platelets 385 --recommend continued observation at this time.  --recommend RTC in 6 months time with repeat MGUS labs at 3 months  Orders Placed This Encounter  Procedures   TSH    Standing Status:   Future    Standing Expiration Date:   12/25/2023   T4    Standing Status:   Future    Standing Expiration Date:   12/25/2023    All questions were answered. The patient knows to  call the clinic with any problems, questions or concerns.  A total of more than 30 minutes were spent on this encounter with face-to-face time and non-face-to-face time, including preparing to see the patient, ordering tests and/or medications, counseling the patient and coordination of care as outlined above.   Ulysees Barns, MD Department of Hematology/Oncology [facility] Phone: [phone] Pager: [phone] Email: [email or web address]  12/25/2022 11:41 AM

## 2022-12-26 ENCOUNTER — Telehealth: Payer: Self-pay | Admitting: Physician Assistant

## 2022-12-26 LAB — T4: T4, Total: 7.7 ug/dL (ref 4.5–12.0)

## 2022-12-28 ENCOUNTER — Telehealth: Payer: Self-pay | Admitting: *Deleted

## 2022-12-28 NOTE — Telephone Encounter (Signed)
TCT patient regarding recent lab results.  Spoke with him and advised that we were able to get his thyroid studies and they were normal. Dr. Leonides Schanz is unsure what is causing his continued fatigue. That will need to be worked up further by his PCP. We will plan to see him back for labs in 3 months time.  Pt voiced understanding.

## 2022-12-28 NOTE — Telephone Encounter (Signed)
-----   Message from Jaci Standard, MD sent at 12/26/2022  9:13 AM EDT ----- Please let Mr. Cooksey know that we were able to get his thyroid studies and they were normal. I am unsure what is causing his continued fatigue. That will need to be worked up further by his PCP. We will plan to see him back for labs in 3 months time.   ----- Message ----- From: Leory Plowman, Lab [facility] [place] Sent: 12/25/2022   1:54 PM EDT To: Jaci Standard, MD

## 2022-12-31 ENCOUNTER — Encounter (HOSPITAL_COMMUNITY): Payer: Self-pay | Admitting: Physician Assistant

## 2023-01-01 ENCOUNTER — Ambulatory Visit: Payer: Medicare HMO | Admitting: Hematology and Oncology

## 2023-01-01 ENCOUNTER — Other Ambulatory Visit: Payer: Medicare HMO

## 2023-03-26 ENCOUNTER — Other Ambulatory Visit: Payer: Self-pay | Admitting: *Deleted

## 2023-03-26 ENCOUNTER — Inpatient Hospital Stay: Payer: Medicare HMO | Attending: Physician Assistant

## 2023-03-26 DIAGNOSIS — D649 Anemia, unspecified: Secondary | ICD-10-CM | POA: Diagnosis not present

## 2023-03-26 DIAGNOSIS — D472 Monoclonal gammopathy: Secondary | ICD-10-CM | POA: Diagnosis not present

## 2023-03-26 LAB — CMP (CANCER CENTER ONLY)
ALT: 22 U/L (ref 0–44)
AST: 18 U/L (ref 15–41)
Albumin: 3.8 g/dL (ref 3.5–5.0)
Alkaline Phosphatase: 83 U/L (ref 38–126)
Anion gap: 6 (ref 5–15)
BUN: 13 mg/dL (ref 8–23)
CO2: 27 mmol/L (ref 22–32)
Calcium: 9.3 mg/dL (ref 8.9–10.3)
Chloride: 102 mmol/L (ref 98–111)
Creatinine: 0.8 mg/dL (ref 0.61–1.24)
GFR, Estimated: >60 mL/min (ref >60)
Glucose, Bld: 90 mg/dL (ref 70–99)
Potassium: 4 mmol/L (ref 3.5–5.1)
Sodium: 135 mmol/L (ref 135–145)
Total Bilirubin: 0.3 mg/dL (ref 0.3–1.2)
Total Protein: 7.3 g/dL (ref 6.5–8.1)

## 2023-03-26 LAB — IRON AND IRON BINDING CAPACITY (CC-WL,HP ONLY)
Iron: 35 ug/dL — ABNORMAL LOW (ref 45–182)
Saturation Ratios: 11 % — ABNORMAL LOW (ref 17.9–39.5)
TIBC: 311 ug/dL (ref 250–450)
UIBC: 276 ug/dL (ref 117–376)

## 2023-03-26 LAB — CBC WITH DIFFERENTIAL (CANCER CENTER ONLY)
Abs Immature Granulocytes: 0.01 10*3/uL (ref 0.00–0.07)
Basophils Absolute: 0.1 10*3/uL (ref 0.0–0.1)
Basophils Relative: 1 %
Eosinophils Absolute: 0.2 10*3/uL (ref 0.0–0.5)
Eosinophils Relative: 3 %
HCT: 35.6 % — ABNORMAL LOW (ref 39.0–52.0)
Hemoglobin: 11.9 g/dL — ABNORMAL LOW (ref 13.0–17.0)
Immature Granulocytes: 0 %
Lymphocytes Relative: 32 %
Lymphs Abs: 1.7 10*3/uL (ref 0.7–4.0)
MCH: 28.8 pg (ref 26.0–34.0)
MCHC: 33.4 g/dL (ref 30.0–36.0)
MCV: 86.2 fL (ref 80.0–100.0)
Monocytes Absolute: 0.6 10*3/uL (ref 0.1–1.0)
Monocytes Relative: 11 %
Neutro Abs: 2.9 10*3/uL (ref 1.7–7.7)
Neutrophils Relative %: 53 %
Platelet Count: 367 10*3/uL (ref 150–400)
RBC: 4.13 MIL/uL — ABNORMAL LOW (ref 4.22–5.81)
RDW: 14.2 % (ref 11.5–15.5)
WBC Count: 5.4 10*3/uL (ref 4.0–10.5)
nRBC: 0 % (ref 0.0–0.2)

## 2023-03-26 LAB — FERRITIN: Ferritin: 60 ng/mL (ref 24–336)

## 2023-03-29 DIAGNOSIS — I10 Essential (primary) hypertension: Secondary | ICD-10-CM | POA: Diagnosis not present

## 2023-03-29 DIAGNOSIS — E785 Hyperlipidemia, unspecified: Secondary | ICD-10-CM | POA: Diagnosis not present

## 2023-03-29 DIAGNOSIS — R0600 Dyspnea, unspecified: Secondary | ICD-10-CM | POA: Diagnosis not present

## 2023-04-03 DIAGNOSIS — D2262 Melanocytic nevi of left upper limb, including shoulder: Secondary | ICD-10-CM | POA: Diagnosis not present

## 2023-04-03 DIAGNOSIS — Z85828 Personal history of other malignant neoplasm of skin: Secondary | ICD-10-CM | POA: Diagnosis not present

## 2023-04-03 DIAGNOSIS — D225 Melanocytic nevi of trunk: Secondary | ICD-10-CM | POA: Diagnosis not present

## 2023-04-03 DIAGNOSIS — L570 Actinic keratosis: Secondary | ICD-10-CM | POA: Diagnosis not present

## 2023-04-03 DIAGNOSIS — D2261 Melanocytic nevi of right upper limb, including shoulder: Secondary | ICD-10-CM | POA: Diagnosis not present

## 2023-04-03 DIAGNOSIS — L820 Inflamed seborrheic keratosis: Secondary | ICD-10-CM | POA: Diagnosis not present

## 2023-04-03 DIAGNOSIS — L821 Other seborrheic keratosis: Secondary | ICD-10-CM | POA: Diagnosis not present

## 2023-06-14 DIAGNOSIS — R972 Elevated prostate specific antigen [PSA]: Secondary | ICD-10-CM | POA: Diagnosis not present

## 2023-06-25 ENCOUNTER — Other Ambulatory Visit: Payer: Self-pay | Admitting: Hematology and Oncology

## 2023-06-25 ENCOUNTER — Inpatient Hospital Stay: Payer: Medicare HMO | Attending: Physician Assistant

## 2023-06-25 DIAGNOSIS — K219 Gastro-esophageal reflux disease without esophagitis: Secondary | ICD-10-CM | POA: Diagnosis not present

## 2023-06-25 DIAGNOSIS — E7800 Pure hypercholesterolemia, unspecified: Secondary | ICD-10-CM | POA: Diagnosis not present

## 2023-06-25 DIAGNOSIS — I10 Essential (primary) hypertension: Secondary | ICD-10-CM | POA: Diagnosis not present

## 2023-06-25 DIAGNOSIS — Z87891 Personal history of nicotine dependence: Secondary | ICD-10-CM | POA: Insufficient documentation

## 2023-06-25 DIAGNOSIS — Z8673 Personal history of transient ischemic attack (TIA), and cerebral infarction without residual deficits: Secondary | ICD-10-CM | POA: Diagnosis not present

## 2023-06-25 DIAGNOSIS — Z79899 Other long term (current) drug therapy: Secondary | ICD-10-CM | POA: Insufficient documentation

## 2023-06-25 DIAGNOSIS — Z85828 Personal history of other malignant neoplasm of skin: Secondary | ICD-10-CM | POA: Insufficient documentation

## 2023-06-25 DIAGNOSIS — D472 Monoclonal gammopathy: Secondary | ICD-10-CM

## 2023-06-25 DIAGNOSIS — G4733 Obstructive sleep apnea (adult) (pediatric): Secondary | ICD-10-CM | POA: Insufficient documentation

## 2023-06-25 LAB — CMP (CANCER CENTER ONLY)
ALT: 26 U/L (ref 0–44)
AST: 20 U/L (ref 15–41)
Albumin: 4.2 g/dL (ref 3.5–5.0)
Alkaline Phosphatase: 100 U/L (ref 38–126)
Anion gap: 7 (ref 5–15)
BUN: 20 mg/dL (ref 8–23)
CO2: 31 mmol/L (ref 22–32)
Calcium: 9.9 mg/dL (ref 8.9–10.3)
Chloride: 99 mmol/L (ref 98–111)
Creatinine: 0.81 mg/dL (ref 0.61–1.24)
GFR, Estimated: >60 mL/min (ref >60)
Glucose, Bld: 89 mg/dL (ref 70–99)
Potassium: 4 mmol/L (ref 3.5–5.1)
Sodium: 137 mmol/L (ref 135–145)
Total Bilirubin: 0.5 mg/dL (ref <1.2)
Total Protein: 8 g/dL (ref 6.5–8.1)

## 2023-06-25 LAB — CBC WITH DIFFERENTIAL (CANCER CENTER ONLY)
Abs Immature Granulocytes: 0.02 10*3/uL (ref 0.00–0.07)
Basophils Absolute: 0.1 10*3/uL (ref 0.0–0.1)
Basophils Relative: 1 %
Eosinophils Absolute: 0.1 10*3/uL (ref 0.0–0.5)
Eosinophils Relative: 2 %
HCT: 38.4 % — ABNORMAL LOW (ref 39.0–52.0)
Hemoglobin: 13.2 g/dL (ref 13.0–17.0)
Immature Granulocytes: 0 %
Lymphocytes Relative: 34 %
Lymphs Abs: 2.3 10*3/uL (ref 0.7–4.0)
MCH: 29.5 pg (ref 26.0–34.0)
MCHC: 34.4 g/dL (ref 30.0–36.0)
MCV: 85.7 fL (ref 80.0–100.0)
Monocytes Absolute: 0.7 10*3/uL (ref 0.1–1.0)
Monocytes Relative: 9 %
Neutro Abs: 3.7 10*3/uL (ref 1.7–7.7)
Neutrophils Relative %: 54 %
Platelet Count: 365 10*3/uL (ref 150–400)
RBC: 4.48 MIL/uL (ref 4.22–5.81)
RDW: 13.5 % (ref 11.5–15.5)
WBC Count: 6.9 10*3/uL (ref 4.0–10.5)
nRBC: 0 % (ref 0.0–0.2)

## 2023-06-25 LAB — LACTATE DEHYDROGENASE: LDH: 138 U/L (ref 98–192)

## 2023-06-26 LAB — KAPPA/LAMBDA LIGHT CHAINS
Kappa free light chain: 23.6 mg/L — ABNORMAL HIGH (ref 3.3–19.4)
Kappa, lambda light chain ratio: 0.17 — ABNORMAL LOW (ref 0.26–1.65)
Lambda free light chains: 140.2 mg/L — ABNORMAL HIGH (ref 5.7–26.3)

## 2023-06-27 ENCOUNTER — Telehealth: Payer: Self-pay | Admitting: *Deleted

## 2023-06-27 LAB — MULTIPLE MYELOMA PANEL, SERUM
Albumin SerPl Elph-Mcnc: 3.6 g/dL (ref 2.9–4.4)
Albumin/Glob SerPl: 1 (ref 0.7–1.7)
Alpha 1: 0.3 g/dL (ref 0.0–0.4)
Alpha2 Glob SerPl Elph-Mcnc: 0.9 g/dL (ref 0.4–1.0)
B-Globulin SerPl Elph-Mcnc: 1.3 g/dL (ref 0.7–1.3)
Gamma Glob SerPl Elph-Mcnc: 1.3 g/dL (ref 0.4–1.8)
Globulin, Total: 3.8 g/dL (ref 2.2–3.9)
IgA: 414 mg/dL (ref 61–437)
IgG (Immunoglobin G), Serum: 1412 mg/dL (ref 603–1613)
IgM (Immunoglobulin M), Srm: 71 mg/dL (ref 15–143)
Total Protein ELP: 7.4 g/dL (ref 6.0–8.5)

## 2023-06-27 NOTE — Telephone Encounter (Signed)
Received call from pt . He is inquiring about his MGUS lab results. Advised that we are still waiting on his Myeloma panel to result. Reviewed his Kappa/lambda free light chains.  The lambda free light chain is slightly elevated over last time. Advised tht he sees Dr. Leonides Schanz next week and he will go over the results with him then. Advised CBC looks great. Pt voiced understanding.

## 2023-07-02 NOTE — Progress Notes (Unsigned)
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  11/07/2022: SPEP did not detect monoclonal protein but polyclonal increase. Urine protein electrophoresis did detect M-spike measuring 32.8% with IFE that showed bence jones proteins; lambda type. Iron saturation 13% (L), Ferritin 88, TIBC 282, Iron 38, vitamin B12 381.  11/28/2022: Establish care with [facility] Hematology. Kappa 29.3, Lambda 127.9, Ratio 0.23. UPEP showed Bence Jones Protein.  12/18/2022: bone marrow biopsy showed normocellular bone marrow (30%) with mildly increased plasma cells (9% by manual aspirate differential, and up to 10% by CD138  immunohistochemical analysis), and lambda predominance by kappa/lambda  in situ hybridization.   Interval History:  Caleb Rogers 72 y.o. male with medical history significant for free lambda light chain MGUS who presents for a follow up visit. The patient's last visit was on 12/25/2022. In the interim since the last visit he has had no major changes in his health.  He reports that he is "just been getting old".  On exam today Caleb Rogers reports that he has been well overall in the interim since her last visit.  He reports that he has been exercising periodically and trying to build up his strength.  He has not been having any issues with bone or back pain.  He denies any urinary symptoms such as bubbling of the urine, change in the color, or foaming.  He reports that he does take vitamin B12 and iron supplementation.  He notes his energy levels are good and his appetite is strong.  Overall he feels well with no questions concerns or  complaints.  He reports no fevers, chills, sweats, nausea, vomiting or diarrhea.  A full 10 point ROS was otherwise negative.   MEDICAL HISTORY:  Past Medical History:  Diagnosis Date   Gait instability 08/23/2013   GERD (gastroesophageal reflux disease)    Hypertension    Impaired fasting glucose    OSA (obstructive sleep apnea) 02/24/2014   Pure hypercholesterolemia    Skin cancer of face    not melanoma   Sleep apnea    TIA (transient ischemic attack)    2015    SURGICAL HISTORY: Past Surgical History:  Procedure Laterality Date   COLONOSCOPY     none     TEE WITHOUT CARDIOVERSION N/A 11/11/2014   Procedure: TRANSESOPHAGEAL ECHOCARDIOGRAM (TEE);  Surgeon: Yates Decamp, MD;  Location: [facility] ENDOSCOPY;  Service: Cardiovascular;  Laterality: N/A;  h/p in file drawer   TOTAL HIP ARTHROPLASTY Right 06/20/2022   Procedure: RIGHT TOTAL HIP ARTHROPLASTY ANTERIOR APPROACH;  Surgeon: Tarry Kos, MD;  Location: [facility] OR;  Service: Orthopedics;  Laterality: Right;  3-C    SOCIAL HISTORY: Social History   Socioeconomic History   Marital status: Married    Spouse name: [name]   Number of children: 2   Years of education: college   Highest education level: Not on file  Occupational History   Occupation: Chief Strategy Officer: Production assistant, radio FOR SELF EMPLOYED  Tobacco Use   Smoking status: Former    Current packs/day: 0.00    Types: Cigarettes    Start date: 07/24/1963    Quit date: 07/23/1968    Years since quitting: 54.9  Smokeless tobacco: Never   Tobacco comments:    pt quit July 1997  Vaping Use   Vaping status: Never Used  Substance and Sexual Activity   Alcohol use: Not Currently   Drug use: No   Sexual activity: Yes    Partners: Female  Other Topics Concern   Not on file  Social History Narrative   Patient is married [name]) and lives at home with his wife.   Patient has two adult children.   Patient is working full-time.   Patient has a college education.   Patient is  right- handed.   Patient drinks two cups of coffee daily.   Social Determinants of Health   Financial Resource Strain: Not on file  Food Insecurity: No Food Insecurity (06/20/2022)   Hunger Vital Sign    Worried About Running Out of Food in the Last Year: Never true    Ran Out of Food in the Last Year: Never true  Transportation Needs: No Transportation Needs (06/20/2022)   PRAPARE - Administrator, Civil Service (Medical): No    Lack of Transportation (Non-Medical): No  Physical Activity: Not on file  Stress: Not on file  Social Connections: Not on file  Intimate Partner Violence: Not At Risk (06/20/2022)   Humiliation, Afraid, Rape, and Kick questionnaire    Fear of Current or Ex-Partner: No    Emotionally Abused: No    Physically Abused: No    Sexually Abused: No    FAMILY HISTORY: Family History  Problem Relation Age of Onset   Breast cancer Mother        after age 70   Congestive Heart Failure Mother    Coronary artery disease Father    Coronary artery disease Brother 37    ALLERGIES:  is allergic to atorvastatin and other.  MEDICATIONS:  Current Outpatient Medications  Medication Sig Dispense Refill   amoxicillin (AMOXIL) 500 MG capsule Take 4 pills one hour prior to dental work 8 capsule 2   ezetimibe (ZETIA) 10 MG tablet Take 1 tablet (10 mg total) by mouth daily. 30 tablet 0   HYDROcodone-acetaminophen (NORCO) 5-325 MG tablet Take 1 tablet by mouth at bedtime as needed. 10 tablet 0   MAGNESIUM PO Take 1 tablet by mouth daily.     methocarbamol (ROBAXIN-750) 750 MG tablet Take 1 tablet (750 mg total) by mouth 2 (two) times daily as needed for muscle spasms. 30 tablet 2   mupirocin ointment (BACTROBAN) 2 % Apply 1 Application topically 2 (two) times daily. 22 g 2   omeprazole (PRILOSEC) 40 MG capsule Take 40 mg by mouth daily.     POTASSIUM PO Take 1 tablet by mouth daily.     REPATHA SURECLICK 140 MG/ML SOAJ Inject 140 mg into the skin every 14  (fourteen) days.     valsartan-hydrochlorothiazide (DIOVAN HCT) 160-25 MG tablet Take 1 tablet by mouth daily. 90 tablet 3   No current facility-administered medications for this visit.    REVIEW OF SYSTEMS:   Constitutional: ( - ) fevers, ( - )  chills , ( - ) night sweats Eyes: ( - ) blurriness of vision, ( - ) double vision, ( - ) watery eyes Ears, nose, mouth, throat, and face: ( - ) mucositis, ( - ) sore throat Respiratory: ( - ) cough, ( - ) dyspnea, ( - ) wheezes Cardiovascular: ( - ) palpitation, ( - ) chest discomfort, ( - ) lower extremity swelling Gastrointestinal:  ( - )  nausea, ( - ) heartburn, ( - ) change in bowel habits Skin: ( - ) abnormal skin rashes Lymphatics: ( - ) new lymphadenopathy, ( - ) easy bruising Neurological: ( - ) numbness, ( - ) tingling, ( - ) new weaknesses Behavioral/Psych: ( - ) mood change, ( - ) new changes  All other systems were reviewed with the patient and are negative.  PHYSICAL EXAMINATION: Vitals:   07/03/23 1156  BP: (!) 150/83  Pulse: (!) 55  Resp: 15  Temp: (!) 97.1 F (36.2 C)  SpO2: 100%    Filed Weights   07/03/23 1156  Weight: 193 lb 9.6 oz (87.8 kg)     GENERAL: Well-appearing elderly Caucasian male, alert, no distress and comfortable SKIN: skin color, texture, turgor are normal, no rashes or significant lesions EYES: conjunctiva are pink and non-injected, sclera clear LUNGS: clear to auscultation and percussion with normal breathing effort HEART: regular rate & rhythm and no murmurs and no lower extremity edema Musculoskeletal: no cyanosis of digits and no clubbing  PSYCH: alert & oriented x 3, fluent speech NEURO: no focal motor/sensory deficits  LABORATORY DATA:  I have reviewed the data as listed    Latest Ref Rng & Units 06/25/2023   12:00 PM 03/26/2023   12:18 PM 12/25/2022   10:02 AM  CBC  WBC 4.0 - 10.5 K/uL 6.9  5.4  4.5   Hemoglobin 13.0 - 17.0 g/dL 36.6  44.0  34.7   Hematocrit 39.0 - 52.0 % 38.4  35.6   35.9   Platelets 150 - 400 K/uL 365  367  385        Latest Ref Rng & Units 06/25/2023   12:00 PM 03/26/2023   12:18 PM 12/25/2022   10:02 AM  CMP  Glucose 70 - 99 mg/dL 89  90  425   BUN 8 - 23 mg/dL 20  13  17    Creatinine 0.61 - 1.24 mg/dL 9.56  3.87  5.64   Sodium 135 - 145 mmol/L 137  135  137   Potassium 3.5 - 5.1 mmol/L 4.0  4.0  4.0   Chloride 98 - 111 mmol/L 99  102  103   CO2 22 - 32 mmol/L 31  27  28    Calcium 8.9 - 10.3 mg/dL 9.9  9.3  9.2   Total Protein 6.5 - 8.1 g/dL 8.0  7.3  7.6   Total Bilirubin <1.2 mg/dL 0.5  0.3  0.5   Alkaline Phos 38 - 126 U/L 100  83  85   AST 15 - 41 U/L 20  18  20    ALT 0 - 44 U/L 26  22  26      Lab Results  Component Value Date   MPROTEIN Not Observed 06/25/2023   MPROTEIN Not Observed 11/28/2022   Lab Results  Component Value Date   KPAFRELGTCHN 23.6 (H) 06/25/2023   KPAFRELGTCHN 29.3 (H) 11/28/2022   LAMBDASER 140.2 (H) 06/25/2023   LAMBDASER 127.9 (H) 11/28/2022   KAPLAMBRATIO 0.17 (L) 06/25/2023   KAPLAMBRATIO 0.50 (L) 12/03/2022   KAPLAMBRATIO 0.23 (L) 11/28/2022    RADIOGRAPHIC STUDIES: No results found.  ASSESSMENT & PLAN Caleb Rogers 72 y.o. male with medical history significant for free lambda light chain MGUS who presents for a follow up visit.  #Lambda light chain monoclonal gammopathy: --Outside UPEP from 11/07/2022 detected M-spike measuring 32.8% with IFE that showed bence jones proteins; lambda type.SPEP did not detect M protein --Labs from 11/28/2022 were reviewed  with the patient. SPEP/IFE did not detect a monoclonal protein. Serum free light chains showed elevated lambda light chain of 127.9, Kappa light chain of 29.3 and ratio of 0.23. CBC showed persistent anemia with Hgb 11.5, Plt 407K. CMP did not show renal dysfunction or hypercalcemia.  --24 hour UPEP from 12/03/2022 did detect monoclonal protein measuring 41 mg/24 hr. IFE showed bence jones proteins, lambda type --Bone met survey from 11/30/2022 did not  show any lytic lesions or pathologic fractures -- bone marrow biopsy showed 9-10% plasma cells.  PLAN: --labs today show white blood cell 6.9, hemoglobin 13.2, MCV 85.7, platelets 365 --Serum free light chain levels relatively stable.  M protein undetectable in the serum.  Will make sure we check UPEP yearly, due at next visit. --recommend continued observation at this time.  --recommend RTC in 6 months time with repeat MGUS labs at 3 months  No orders of the defined types were placed in this encounter.   All questions were answered. The patient knows to call the clinic with any problems, questions or concerns.  A total of more than 25 minutes were spent on this encounter with face-to-face time and non-face-to-face time, including preparing to see the patient, ordering tests and/or medications, counseling the patient and coordination of care as outlined above.   Ulysees Barns, MD Department of Hematology/Oncology [facility] Phone: [phone] Pager: [phone] Email: [email or web address]  07/03/2023 4:08 PM

## 2023-07-03 ENCOUNTER — Inpatient Hospital Stay (HOSPITAL_BASED_OUTPATIENT_CLINIC_OR_DEPARTMENT_OTHER): Payer: Medicare HMO | Admitting: Hematology and Oncology

## 2023-07-03 VITALS — BP 150/83 | HR 55 | Temp 97.1°F | Resp 15 | Wt 193.6 lb

## 2023-07-03 DIAGNOSIS — Z79899 Other long term (current) drug therapy: Secondary | ICD-10-CM | POA: Diagnosis not present

## 2023-07-03 DIAGNOSIS — Z8673 Personal history of transient ischemic attack (TIA), and cerebral infarction without residual deficits: Secondary | ICD-10-CM | POA: Diagnosis not present

## 2023-07-03 DIAGNOSIS — I10 Essential (primary) hypertension: Secondary | ICD-10-CM | POA: Diagnosis not present

## 2023-07-03 DIAGNOSIS — G4733 Obstructive sleep apnea (adult) (pediatric): Secondary | ICD-10-CM | POA: Diagnosis not present

## 2023-07-03 DIAGNOSIS — D472 Monoclonal gammopathy: Secondary | ICD-10-CM

## 2023-07-03 DIAGNOSIS — K219 Gastro-esophageal reflux disease without esophagitis: Secondary | ICD-10-CM | POA: Diagnosis not present

## 2023-07-03 DIAGNOSIS — Z87891 Personal history of nicotine dependence: Secondary | ICD-10-CM | POA: Diagnosis not present

## 2023-07-03 DIAGNOSIS — Z85828 Personal history of other malignant neoplasm of skin: Secondary | ICD-10-CM | POA: Diagnosis not present

## 2023-07-03 DIAGNOSIS — E7800 Pure hypercholesterolemia, unspecified: Secondary | ICD-10-CM | POA: Diagnosis not present

## 2023-07-26 ENCOUNTER — Other Ambulatory Visit: Payer: Self-pay

## 2023-07-26 MED ORDER — VALSARTAN-HYDROCHLOROTHIAZIDE 160-25 MG PO TABS: 1.0000 | ORAL_TABLET | Freq: Every day | ORAL | 0 refills | Status: DC

## 2023-08-16 ENCOUNTER — Ambulatory Visit (INDEPENDENT_AMBULATORY_CARE_PROVIDER_SITE_OTHER): Payer: Medicare HMO | Admitting: Physician Assistant

## 2023-08-16 ENCOUNTER — Other Ambulatory Visit: Payer: Self-pay | Admitting: Physician Assistant

## 2023-08-16 ENCOUNTER — Encounter: Payer: Self-pay | Admitting: Physician Assistant

## 2023-08-16 DIAGNOSIS — L0292 Furuncle, unspecified: Secondary | ICD-10-CM | POA: Diagnosis not present

## 2023-08-16 DIAGNOSIS — I10 Essential (primary) hypertension: Secondary | ICD-10-CM | POA: Diagnosis not present

## 2023-08-16 DIAGNOSIS — Z96641 Presence of right artificial hip joint: Secondary | ICD-10-CM

## 2023-08-16 DIAGNOSIS — L089 Local infection of the skin and subcutaneous tissue, unspecified: Secondary | ICD-10-CM | POA: Diagnosis not present

## 2023-08-16 MED ORDER — DOXYCYCLINE HYCLATE 100 MG PO TABS: 100.0000 mg | ORAL_TABLET | Freq: Two times a day (BID) | ORAL | 0 refills | Status: DC

## 2023-08-16 NOTE — Progress Notes (Signed)
Post-Op Visit Note   Patient: Caleb Rogers           Date of Birth: 11-May-1951           MRN: [id number] Visit Date: 08/16/2023 PCP: Rodrigo Ran, MD   Assessment & Plan:  Chief Complaint:  Chief Complaint  Patient presents with   Right Hip - Pain   Visit Diagnoses:  1. Status post total replacement of right hip     Plan: Patient is a pleasant 73 year old gentleman who comes in today with concerns about his right hip.  He is a little over a year out from right total hip replacement, date of surgery 06/20/2022.  He has been doing okay since surgery.  About 2 weeks ago, he noticed a fluid-filled pocket to the distal aspect of his incision.  About a week ago, he used a sewing needle to decompress this.  The area filled back up until today when it started to drain.  He was seen by his PCP earlier and sent over to [place].  He is having no pain, fevers or chills.  He is always had a little discomfort to his hip with range of motion and this is not increased over the past 2 weeks.  He is not diabetic nor does he have an underlying autoimmune disease.  No recent bug bite or scratch to the area.  Examination of the right hip reveals a well-healed surgical incision with the exception of the distal aspect where there is a small area of dehiscence.  There is adipose tissue but no purulent drainage.  Unable to express some serous drainage.  No cellulitis or erythema.  No tenderness.  He does have mild discomfort with range of motion of the hip which is unchanged.  At this point, I would like to place him on doxycycline twice daily.  A prescription for this has been sent in.  Will apply hydrogen peroxide and cover this with a Band-Aid.  He will do this once daily.  He will follow-up with [place] next week for recheck.  Should he develop any concerning signs or symptoms which we have discussed, he will call and let us know.  Otherwise, we will see him next week.  Follow-Up Instructions: Return in about 1 week  (around 08/23/2023).   Orders:  No orders of the defined types were placed in this encounter.  No orders of the defined types were placed in this encounter.   Imaging: No new imaging  PMFS History: Patient Active Problem List   Diagnosis Date Noted   Status post total replacement of right hip 06/20/2022   Primary osteoarthritis of right hip 05/18/2022   Loud snoring 05/11/2021   Hypersomnia with sleep apnea 05/11/2021   CPAP (continuous positive airway pressure) dependence 05/11/2021   OSA on CPAP 09/27/2014   OSA (obstructive sleep apnea) 02/24/2014   Gait instability 09/16/2013   Hypertension 09/16/2013   Hyperlipidemia 09/16/2013   TIA (transient ischemic attack) 09/16/2013   Past Medical History:  Diagnosis Date   Gait instability 08/23/2013   GERD (gastroesophageal reflux disease)    Hypertension    Impaired fasting glucose    OSA (obstructive sleep apnea) 02/24/2014   Pure hypercholesterolemia    Skin cancer of face    not melanoma   Sleep apnea    TIA (transient ischemic attack)    2015    Family History  Problem Relation Age of Onset   Breast cancer Mother  after age 36   Congestive Heart Failure Mother    Coronary artery disease Father    Coronary artery disease Brother 9    Past Surgical History:  Procedure Laterality Date   COLONOSCOPY     none     TEE WITHOUT CARDIOVERSION N/A 11/11/2014   Procedure: TRANSESOPHAGEAL ECHOCARDIOGRAM (TEE);  Surgeon: Yates Decamp, MD;  Location: [facility] ENDOSCOPY;  Service: Cardiovascular;  Laterality: N/A;  h/p in file drawer   TOTAL HIP ARTHROPLASTY Right 06/20/2022   Procedure: RIGHT TOTAL HIP ARTHROPLASTY ANTERIOR APPROACH;  Surgeon: Tarry Kos, MD;  Location: [facility] OR;  Service: Orthopedics;  Laterality: Right;  3-C   Social History   Occupational History   Occupation: Chief Strategy Officer: Production assistant, radio FOR SELF EMPLOYED  Tobacco Use   Smoking status: Former    Current packs/day: 0.00    Types: Cigarettes     Start date: 07/24/1963    Quit date: 07/23/1968    Years since quitting: 55.1   Smokeless tobacco: Never   Tobacco comments:    pt quit July 1997  Vaping Use   Vaping status: Never Used  Substance and Sexual Activity   Alcohol use: Not Currently   Drug use: No   Sexual activity: Yes    Partners: Female

## 2023-08-21 NOTE — Progress Notes (Unsigned)
Patient ID: Caleb Rogers, male   DOB: 1950-11-30, 73 y.o.   MRN: [id number]  Patient returns today recheck of his right hip incision.  Caleb Rogers saw [facility] last week and was placed on mupirocin and doxycycline.  Overall it is looking much better today.  Examination shows small amount of serous drainage from the distal extent of the incision.  There is no evidence of infection.  From my standpoint I would like him to finish out the doxycycline and continue with the mupirocin ointment.  This appears to be a noninfected superficial wound dehiscence.  This should heal just fine.  He will let me know if anything changes.

## 2023-08-22 ENCOUNTER — Ambulatory Visit: Payer: Medicare HMO | Admitting: Orthopaedic Surgery

## 2023-08-22 ENCOUNTER — Encounter: Payer: Self-pay | Admitting: Orthopaedic Surgery

## 2023-08-22 DIAGNOSIS — Z96641 Presence of right artificial hip joint: Secondary | ICD-10-CM

## 2023-08-29 ENCOUNTER — Other Ambulatory Visit: Payer: Self-pay | Admitting: Cardiology

## 2023-09-11 ENCOUNTER — Telehealth: Payer: Self-pay

## 2023-09-11 ENCOUNTER — Other Ambulatory Visit: Payer: Self-pay | Admitting: Physician Assistant

## 2023-09-11 MED ORDER — DOXYCYCLINE HYCLATE 100 MG PO TABS: 100.0000 mg | ORAL_TABLET | Freq: Two times a day (BID) | ORAL | 0 refills | Status: DC

## 2023-09-11 NOTE — Telephone Encounter (Signed)
 Notified patient.

## 2023-09-11 NOTE — Telephone Encounter (Signed)
Please send in Doxy to his pharmacy per Dr.Xu. Patient scheduled to come in tomorrow at 10am.

## 2023-09-11 NOTE — Telephone Encounter (Signed)
Sent to [facility] on file

## 2023-09-12 ENCOUNTER — Other Ambulatory Visit: Payer: Self-pay | Admitting: Cardiology

## 2023-09-12 ENCOUNTER — Ambulatory Visit: Payer: Medicare HMO | Admitting: Orthopaedic Surgery

## 2023-09-12 ENCOUNTER — Encounter (HOSPITAL_BASED_OUTPATIENT_CLINIC_OR_DEPARTMENT_OTHER): Payer: Self-pay | Admitting: Orthopaedic Surgery

## 2023-09-12 ENCOUNTER — Other Ambulatory Visit: Payer: Self-pay

## 2023-09-12 DIAGNOSIS — Z96641 Presence of right artificial hip joint: Secondary | ICD-10-CM | POA: Diagnosis not present

## 2023-09-12 NOTE — Progress Notes (Signed)
   09/12/23 1320  Pre-op Phone Call  Surgery Date Verified 09/18/23  Arrival Time Verified 1300  Surgery Location Verified [facility] [place]  Medical History Reviewed Yes  Is the patient taking a GLP-1 receptor agonist? No  Does the patient have diabetes? No diagnosis of diabetes  Do you have a history of heart problems? Yes  Cardiologist Name Jacinto Halim  Have you ever had tests on your heart? Yes  What cardiac tests were performed? Echo  Results viewable: [facility] Media Tab  Patient Teaching Enhanced Recovery;Pre / Post Procedure  Patient educated about smoking cessation 24 hours prior to surgery. N/A Non-Smoker  Patient verbalizes understanding of bowel prep? N/A  THA/TKA patients only:  By your surgery date, will you have been taking narcotics for 90 days or greater? No  Med Rec Completed Yes  Take the Following Meds the Morning of Surgery hold Diovan and vitamins  Recent  Lab Work, EKG, CXR? No  NPO (Including gum & candy) After midnight  Allowed clear liquids Water;Gatorade  (diabetics please choose diet or no sugar options);Black Coffee Only (no creamer, milk or cream including half and half)  Patient instructed to stop clear liquids including Carb loading drink at: 1130  Stop Solids, Milk, Candy, and Gum STARTING AT MIDNIGHT  Did patient view EMMI videos? No  Responsible adult to drive and be with you for 24 hours? Yes  Name & Phone Number for Ride/Caregiver Wife Caleb Rogers  No Jewelry, money, nail polish or make-up.  No lotions, powders, perfumes. No shaving  48 hrs. prior to surgery. Yes  Contacts, Dentures & Glasses Will Have to be Removed Before OR. Yes  Please bring your ID and Insurance Card the morning of your surgery. (Surgery Centers Only) Yes  Bring any papers or x-rays with you that your surgeon gave you. Yes  Instructed to contact the location of procedure/ provider if they or anyone in their household develops symptoms or tests positive for COVID-19, has close contact with someone who tests  positive for COVID, or has known exposure to any contagious illness. Yes  Call this number the morning of surgery  with any problems that may cancel your surgery. [phone]  Covid-19 Assessment  Have you had a positive COVID-19 test within the previous 90 days? No  COVID Testing Guidance Proceed with the additional questions.  Patient's surgery required a COVID-19 test (cardiothoracic, complex ENT, and bronchoscopies/ EBUS) No  Have you been unmasked and in close contact with anyone with COVID-19 or COVID-19 symptoms within the past 10 days? No  Do you or anyone in your household currently have any COVID-19 symptoms? No

## 2023-09-12 NOTE — Progress Notes (Signed)
Office Visit Note   Patient: Caleb Rogers           Date of Birth: Dec 12, 1950           MRN: [id number] Visit Date: 09/12/2023              Requested by: Caleb Ran, MD [address] [place],  [place] [address] PCP: Caleb Ran, MD   Assessment & Plan: Visit Diagnoses:  1. Status post total replacement of right hip     Plan: Caleb Rogers is status post right total hip replacement of about a little over a year ago.  He has developed surgical wound dehiscence of about a centimeter in diameter.  Partially resolved with doxycycline.  We have put him back on doxycycline.  Based on my findings I do think that this needs to be formally debrided and closed in the operating room since he has had a total hip in the past.  I tried to unroofed this with Adson's and Q-tip but was unsuccessful.  He agrees with moving forward with I&D.  Will put him on the schedule for next week.  Follow-Up Instructions: No follow-ups on file.   Orders:  No orders of the defined types were placed in this encounter.  No orders of the defined types were placed in this encounter.     Procedures: No procedures performed   Clinical Data: No additional findings.   Subjective: No chief complaint on file.   HPI Caleb Rogers comes in for recheck of his right thigh.  He developed a wound dehiscence a couple weeks ago which resolved with doxycycline.  He states that the area opened back up after he finished the course of doxycycline. Review of Systems   Objective: Vital Signs: There were no vitals taken for this visit.  Physical Exam  Ortho Exam Examination right hip shows a 1 cm surgical wound dehiscence at the very distal end of the surgical scar.  There is hypergranulation tissue.  There is no surrounding cellulitis. Specialty Comments:  No specialty comments available.  Imaging: No results found.   PMFS History: Patient Active Problem List   Diagnosis Date Noted   Status post total replacement of right  hip 06/20/2022   Primary osteoarthritis of right hip 05/18/2022   Loud snoring 05/11/2021   Hypersomnia with sleep apnea 05/11/2021   CPAP (continuous positive airway pressure) dependence 05/11/2021   OSA on CPAP 09/27/2014   OSA (obstructive sleep apnea) 02/24/2014   Gait instability 09/16/2013   Hypertension 09/16/2013   Hyperlipidemia 09/16/2013   TIA (transient ischemic attack) 09/16/2013   Past Medical History:  Diagnosis Date   Gait instability 08/23/2013   GERD (gastroesophageal reflux disease)    Hypertension    Impaired fasting glucose    OSA (obstructive sleep apnea) 02/24/2014   Pure hypercholesterolemia    Skin cancer of face    not melanoma   Sleep apnea    TIA (transient ischemic attack)    2015    Family History  Problem Relation Age of Onset   Breast cancer Mother        after age 17   Congestive Heart Failure Mother    Coronary artery disease Father    Coronary artery disease Brother 54    Past Surgical History:  Procedure Laterality Date   COLONOSCOPY     none     TEE WITHOUT CARDIOVERSION N/A 11/11/2014   Procedure: TRANSESOPHAGEAL ECHOCARDIOGRAM (TEE);  Surgeon: Caleb Decamp, MD;  Location: [facility] ENDOSCOPY;  Service:  Cardiovascular;  Laterality: N/A;  h/p in file drawer   TOTAL HIP ARTHROPLASTY Right 06/20/2022   Procedure: RIGHT TOTAL HIP ARTHROPLASTY ANTERIOR APPROACH;  Surgeon: Caleb Kos, MD;  Location: [facility] OR;  Service: Orthopedics;  Laterality: Right;  3-C   Social History   Occupational History   Occupation: Chief Strategy Officer: Production assistant, radio FOR SELF EMPLOYED  Tobacco Use   Smoking status: Former    Current packs/day: 0.00    Types: Cigarettes    Start date: 07/24/1963    Quit date: 07/23/1968    Years since quitting: 55.1   Smokeless tobacco: Never   Tobacco comments:    pt quit July 1997  Vaping Use   Vaping status: Never Used  Substance and Sexual Activity   Alcohol use: Not Currently   Drug use: No   Sexual activity: Yes     Partners: Female

## 2023-09-12 NOTE — Telephone Encounter (Signed)
Please contact patient to schedule a follow appointment with Dr. Jacinto Halim, to continue to receive additional refills.

## 2023-09-16 ENCOUNTER — Encounter (HOSPITAL_BASED_OUTPATIENT_CLINIC_OR_DEPARTMENT_OTHER)
Admission: RE | Admit: 2023-09-16 | Discharge: 2023-09-16 | Disposition: A | Discharge status: Home / Self Care | Payer: Medicare HMO | Source: Ambulatory Visit | Attending: Orthopaedic Surgery | Admitting: Orthopaedic Surgery

## 2023-09-16 DIAGNOSIS — I10 Essential (primary) hypertension: Secondary | ICD-10-CM | POA: Diagnosis not present

## 2023-09-16 DIAGNOSIS — T8131XA Disruption of external operation (surgical) wound, not elsewhere classified, initial encounter: Secondary | ICD-10-CM | POA: Insufficient documentation

## 2023-09-16 DIAGNOSIS — L02415 Cutaneous abscess of right lower limb: Secondary | ICD-10-CM | POA: Insufficient documentation

## 2023-09-16 DIAGNOSIS — X58XXXA Exposure to other specified factors, initial encounter: Secondary | ICD-10-CM | POA: Insufficient documentation

## 2023-09-16 DIAGNOSIS — Z79899 Other long term (current) drug therapy: Secondary | ICD-10-CM | POA: Insufficient documentation

## 2023-09-16 DIAGNOSIS — G4733 Obstructive sleep apnea (adult) (pediatric): Secondary | ICD-10-CM | POA: Insufficient documentation

## 2023-09-16 DIAGNOSIS — K219 Gastro-esophageal reflux disease without esophagitis: Secondary | ICD-10-CM | POA: Diagnosis not present

## 2023-09-16 DIAGNOSIS — Z87891 Personal history of nicotine dependence: Secondary | ICD-10-CM | POA: Insufficient documentation

## 2023-09-16 LAB — BASIC METABOLIC PANEL
Anion gap: 13 (ref 5–15)
BUN: 18 mg/dL (ref 8–23)
CO2: 23 mmol/L (ref 22–32)
Calcium: 9.3 mg/dL (ref 8.9–10.3)
Chloride: 105 mmol/L (ref 98–111)
Creatinine, Ser: 0.84 mg/dL (ref 0.61–1.24)
GFR, Estimated: >60 mL/min (ref >60)
Glucose, Bld: 93 mg/dL (ref 70–99)
Potassium: 4.3 mmol/L (ref 3.5–5.1)
Sodium: 141 mmol/L (ref 135–145)

## 2023-09-16 NOTE — Progress Notes (Signed)

## 2023-09-18 ENCOUNTER — Other Ambulatory Visit: Payer: Self-pay

## 2023-09-18 ENCOUNTER — Ambulatory Visit (HOSPITAL_BASED_OUTPATIENT_CLINIC_OR_DEPARTMENT_OTHER): Payer: Medicare HMO | Admitting: Anesthesiology

## 2023-09-18 ENCOUNTER — Encounter (HOSPITAL_BASED_OUTPATIENT_CLINIC_OR_DEPARTMENT_OTHER)
Admission: RE | Disposition: A | Discharge status: Home / Self Care | Payer: Self-pay | Source: Home / Self Care | Attending: Orthopaedic Surgery

## 2023-09-18 ENCOUNTER — Encounter (HOSPITAL_BASED_OUTPATIENT_CLINIC_OR_DEPARTMENT_OTHER): Payer: Self-pay | Admitting: Orthopaedic Surgery

## 2023-09-18 ENCOUNTER — Ambulatory Visit (HOSPITAL_BASED_OUTPATIENT_CLINIC_OR_DEPARTMENT_OTHER)
Admission: RE | Admit: 2023-09-18 | Discharge: 2023-09-18 | Disposition: A | Discharge status: Home / Self Care | Payer: Medicare HMO | Attending: Orthopaedic Surgery | Admitting: Orthopaedic Surgery

## 2023-09-18 DIAGNOSIS — T8131XA Disruption of external operation (surgical) wound, not elsewhere classified, initial encounter: Secondary | ICD-10-CM

## 2023-09-18 DIAGNOSIS — S71101A Unspecified open wound, right thigh, initial encounter: Secondary | ICD-10-CM

## 2023-09-18 DIAGNOSIS — S71001A Unspecified open wound, right hip, initial encounter: Secondary | ICD-10-CM | POA: Diagnosis not present

## 2023-09-18 DIAGNOSIS — Z01818 Encounter for other preprocedural examination: Secondary | ICD-10-CM

## 2023-09-18 DIAGNOSIS — M60003 Infective myositis, unspecified right leg: Secondary | ICD-10-CM

## 2023-09-18 DIAGNOSIS — I10 Essential (primary) hypertension: Secondary | ICD-10-CM | POA: Diagnosis not present

## 2023-09-18 DIAGNOSIS — Z87891 Personal history of nicotine dependence: Secondary | ICD-10-CM | POA: Diagnosis not present

## 2023-09-18 DIAGNOSIS — G4733 Obstructive sleep apnea (adult) (pediatric): Secondary | ICD-10-CM | POA: Diagnosis not present

## 2023-09-18 DIAGNOSIS — Z96641 Presence of right artificial hip joint: Secondary | ICD-10-CM

## 2023-09-18 HISTORY — PX: DEBRIDEMENT AND CLOSURE WOUND: SHX5614

## 2023-09-18 SURGERY — DEBRIDEMENT, WOUND, WITH CLOSURE
Anesthesia: Monitor Anesthesia Care | Site: Hip | Laterality: Right

## 2023-09-18 MED ORDER — FENTANYL CITRATE (PF) 100 MCG/2ML IJ SOLN
INTRAMUSCULAR | Status: DC | PRN
  Administered 2023-09-18: 50 ug via INTRAVENOUS

## 2023-09-18 MED ORDER — ACETAMINOPHEN 500 MG PO TABS
ORAL_TABLET | ORAL | Status: AC
  Filled 2023-09-18: qty 2

## 2023-09-18 MED ORDER — AMISULPRIDE (ANTIEMETIC) 5 MG/2ML IV SOLN: 10.0000 mg | Freq: Once | INTRAVENOUS | Status: DC | PRN

## 2023-09-18 MED ORDER — SODIUM CHLORIDE 0.9 % IV SOLN: INTRAVENOUS | Status: DC | PRN

## 2023-09-18 MED ORDER — LIDOCAINE 2% (20 MG/ML) 5 ML SYRINGE
INTRAMUSCULAR | Status: DC | PRN
  Administered 2023-09-18: 40 mg via INTRAVENOUS

## 2023-09-18 MED ORDER — OXYCODONE HCL 5 MG PO TABS
ORAL_TABLET | ORAL | Status: AC
Start: 2023-09-18 — End: ?
  Filled 2023-09-18: qty 1

## 2023-09-18 MED ORDER — DEXMEDETOMIDINE HCL IN NACL 80 MCG/20ML IV SOLN
INTRAVENOUS | Status: DC | PRN
  Administered 2023-09-18: 8 ug via INTRAVENOUS

## 2023-09-18 MED ORDER — LIDOCAINE 2% (20 MG/ML) 5 ML SYRINGE
INTRAMUSCULAR | Status: AC
  Filled 2023-09-18: qty 5

## 2023-09-18 MED ORDER — ONDANSETRON HCL 4 MG/2ML IJ SOLN
INTRAMUSCULAR | Status: AC
  Filled 2023-09-18: qty 2

## 2023-09-18 MED ORDER — ONDANSETRON HCL 4 MG/2ML IJ SOLN
INTRAMUSCULAR | Status: DC | PRN
  Administered 2023-09-18: 4 mg via INTRAVENOUS

## 2023-09-18 MED ORDER — ACETAMINOPHEN 500 MG PO TABS
1000.0000 mg | ORAL_TABLET | Freq: Once | ORAL | Status: AC
  Administered 2023-09-18: 1000 mg via ORAL

## 2023-09-18 MED ORDER — VANCOMYCIN HCL 1000 MG IV SOLR
INTRAVENOUS | Status: DC | PRN
  Administered 2023-09-18: 1000 mg via TOPICAL

## 2023-09-18 MED ORDER — BUPIVACAINE HCL (PF) 0.25 % IJ SOLN
INTRAMUSCULAR | Status: AC
  Filled 2023-09-18: qty 30

## 2023-09-18 MED ORDER — LACTATED RINGERS IV SOLN: INTRAVENOUS | Status: DC

## 2023-09-18 MED ORDER — BUPIVACAINE HCL (PF) 0.25 % IJ SOLN
INTRAMUSCULAR | Status: DC | PRN
  Administered 2023-09-18: 20 mL

## 2023-09-18 MED ORDER — ONDANSETRON HCL 4 MG/2ML IJ SOLN: 4.0000 mg | Freq: Once | INTRAMUSCULAR | Status: DC | PRN

## 2023-09-18 MED ORDER — FENTANYL CITRATE (PF) 100 MCG/2ML IJ SOLN
INTRAMUSCULAR | Status: AC
  Filled 2023-09-18: qty 2

## 2023-09-18 MED ORDER — MIDAZOLAM HCL 2 MG/2ML IJ SOLN
INTRAMUSCULAR | Status: DC | PRN
  Administered 2023-09-18: 2 mg via INTRAVENOUS

## 2023-09-18 MED ORDER — PROPOFOL 10 MG/ML IV BOLUS
INTRAVENOUS | Status: DC | PRN
  Administered 2023-09-18: 50 mg via INTRAVENOUS
  Administered 2023-09-18: 150 ug/kg/min via INTRAVENOUS

## 2023-09-18 MED ORDER — LIDOCAINE-EPINEPHRINE 1 %-1:100000 IJ SOLN
INTRAMUSCULAR | Status: AC
  Filled 2023-09-18: qty 1

## 2023-09-18 MED ORDER — FENTANYL CITRATE (PF) 100 MCG/2ML IJ SOLN: 25.0000 ug | INTRAMUSCULAR | Status: DC | PRN

## 2023-09-18 MED ORDER — MIDAZOLAM HCL 2 MG/2ML IJ SOLN
INTRAMUSCULAR | Status: AC
  Filled 2023-09-18: qty 2

## 2023-09-18 MED ORDER — OXYCODONE HCL 5 MG PO TABS
5.0000 mg | ORAL_TABLET | Freq: Once | ORAL | Status: AC
  Administered 2023-09-18: 5 mg via ORAL

## 2023-09-18 MED ORDER — BUPIVACAINE HCL (PF) 0.5 % IJ SOLN
INTRAMUSCULAR | Status: AC
  Filled 2023-09-18: qty 30

## 2023-09-18 MED ORDER — DOXYCYCLINE HYCLATE 100 MG PO TABS: 100.0000 mg | ORAL_TABLET | Freq: Two times a day (BID) | ORAL | 0 refills | Status: AC

## 2023-09-18 MED ORDER — VANCOMYCIN HCL 1000 MG IV SOLR
INTRAVENOUS | Status: AC
  Filled 2023-09-18: qty 20

## 2023-09-18 MED ORDER — SODIUM CHLORIDE 0.9 % IR SOLN
Status: DC | PRN
  Administered 2023-09-18: 6000 mL

## 2023-09-18 MED ORDER — KETOROLAC TROMETHAMINE 15 MG/ML IJ SOLN: 15.0000 mg | Freq: Once | INTRAMUSCULAR | Status: DC | PRN

## 2023-09-18 MED ORDER — CEFAZOLIN SODIUM-DEXTROSE 2-4 GM/100ML-% IV SOLN
INTRAVENOUS | Status: AC
  Filled 2023-09-18: qty 100

## 2023-09-18 MED ORDER — CEFAZOLIN SODIUM-DEXTROSE 2-4 GM/100ML-% IV SOLN
2.0000 g | INTRAVENOUS | Status: AC
  Administered 2023-09-18: 2 g via INTRAVENOUS

## 2023-09-18 MED ORDER — LIDOCAINE HCL (PF) 1 % IJ SOLN
INTRAMUSCULAR | Status: AC
  Filled 2023-09-18: qty 30

## 2023-09-18 SURGICAL SUPPLY — 47 items
BLADE HEX COATED 2.75 (ELECTRODE) ×1 IMPLANT
BLADE SURG 10 STRL SS (BLADE) IMPLANT
BLADE SURG 15 STRL LF DISP TIS (BLADE) ×2 IMPLANT
BNDG COHESIVE 4X5 TAN STRL LF (GAUZE/BANDAGES/DRESSINGS) IMPLANT
CANISTER SUCT 1200ML W/VALVE (MISCELLANEOUS) IMPLANT
COVER BACK TABLE 60X90IN (DRAPES) IMPLANT
COVER MAYO STAND STRL (DRAPES) ×1 IMPLANT
DERMABOND ADVANCED .7 DNX12 (GAUZE/BANDAGES/DRESSINGS) IMPLANT
DRAPE IMP U-DRAPE 54X76 (DRAPES) ×1 IMPLANT
DRAPE LAPAROTOMY 100X72 PEDS (DRAPES) IMPLANT
DRAPE SURG 17X23 STRL (DRAPES) ×2 IMPLANT
DRAPE U-SHAPE 76X120 STRL (DRAPES) IMPLANT
DRSG AQUACEL AG ADV 3.5X 6 (GAUZE/BANDAGES/DRESSINGS) IMPLANT
DURAPREP 26ML APPLICATOR (WOUND CARE) ×1 IMPLANT
ELECT REM PT RETURN 9FT ADLT (ELECTROSURGICAL) ×1 IMPLANT
ELECTRODE REM PT RTRN 9FT ADLT (ELECTROSURGICAL) ×1 IMPLANT
GAUZE 4X4 16PLY ~~LOC~~+RFID DBL (SPONGE) IMPLANT
GAUZE SPONGE 4X4 12PLY STRL (GAUZE/BANDAGES/DRESSINGS) ×1 IMPLANT
GAUZE XEROFORM 1X8 LF (GAUZE/BANDAGES/DRESSINGS) ×1 IMPLANT
GLOVE BIOGEL PI IND STRL 7.5 (GLOVE) ×1 IMPLANT
GLOVE ECLIPSE 7.0 STRL STRAW (GLOVE) ×1 IMPLANT
GLOVE INDICATOR 7.0 STRL GRN (GLOVE) ×1 IMPLANT
GLOVE SURG SYN 7.5 E (GLOVE) ×2 IMPLANT
GLOVE SURG SYN 7.5 PF PI (GLOVE) ×1 IMPLANT
GOWN STRL REUS W/ TWL LRG LVL3 (GOWN DISPOSABLE) ×1 IMPLANT
GOWN STRL REUS W/ TWL XL LVL3 (GOWN DISPOSABLE) ×1 IMPLANT
GOWN STRL SURGICAL XL XLNG (GOWN DISPOSABLE) ×1 IMPLANT
MANIFOLD NEPTUNE II (INSTRUMENTS) IMPLANT
NS IRRIG 1000ML POUR BTL (IV SOLUTION) IMPLANT
PACK BASIN DAY SURGERY FS (CUSTOM PROCEDURE TRAY) ×1 IMPLANT
PENCIL SMOKE EVACUATOR (MISCELLANEOUS) ×1 IMPLANT
SHEET MEDIUM DRAPE 40X70 STRL (DRAPES) ×1 IMPLANT
SLEEVE SCD COMPRESS KNEE MED (STOCKING) IMPLANT
SPIKE FLUID TRANSFER (MISCELLANEOUS) IMPLANT
SPONGE T-LAP 18X18 ~~LOC~~+RFID (SPONGE) IMPLANT
STAPLER VISISTAT (STAPLE) IMPLANT
STOCKINETTE IMPERVIOUS LG (DRAPES) IMPLANT
SUCTION TUBE FRAZIER 10FR DISP (SUCTIONS) IMPLANT
SUT ETHILON 2 0 FS 18 (SUTURE) IMPLANT
SUT MON AB 2-0 CT1 36 (SUTURE) IMPLANT
SUT PDS AB 0 CT 36 (SUTURE) IMPLANT
SUT VIC AB 0 CT1 27XBRD ANBCTR (SUTURE) ×1 IMPLANT
SUT VIC AB 2-0 CT1 TAPERPNT 27 (SUTURE) IMPLANT
SYR BULB EAR ULCER 3OZ GRN STR (SYRINGE) IMPLANT
TOWEL GREEN STERILE FF (TOWEL DISPOSABLE) ×1 IMPLANT
TUBE CONNECTING 20X1/4 (TUBING) IMPLANT
YANKAUER SUCT BULB TIP NO VENT (SUCTIONS) IMPLANT

## 2023-09-18 NOTE — Anesthesia Postprocedure Evaluation (Signed)
 Anesthesia Post Note  Patient: Raynard Mapps  Procedure(s) Performed: IRRIGATION AND DEBRIDEMEN RIGHT HIP AND CLOSURE WOUND (Right: Hip)     Patient location during evaluation: PACU Anesthesia Type: MAC Level of consciousness: awake Pain management: pain level controlled Vital Signs Assessment: post-procedure vital signs reviewed and stable Respiratory status: spontaneous breathing, nonlabored ventilation and respiratory function stable Cardiovascular status: blood pressure returned to baseline and stable Postop Assessment: no apparent nausea or vomiting Anesthetic complications: no   No notable events documented.  Last Vitals:  Vitals:   09/18/23 1545 09/18/23 1600  BP: 135/70 (!) 140/80  Pulse: (!) 40 (!) 40  Resp: 13 16  Temp:  (!) 36.2 C  SpO2: 95% 98%    Last Pain:  Vitals:   09/18/23 1600  TempSrc:   PainSc: 4                  Latica Hohmann P Kenney Going

## 2023-09-18 NOTE — H&P (Signed)
 PREOPERATIVE H&P  Chief Complaint: right hip surgical dehiscence  HPI: Caleb Rogers is a 73 y.o. male who presents for surgical treatment of right hip surgical dehiscence.  He denies any changes in medical history.  Past Surgical History:  Procedure Laterality Date   COLONOSCOPY     none     TEE WITHOUT CARDIOVERSION N/A 11/11/2014   Procedure: TRANSESOPHAGEAL ECHOCARDIOGRAM (TEE);  Surgeon: Yates Decamp, MD;  Location: [facility] ENDOSCOPY;  Service: Cardiovascular;  Laterality: N/A;  h/p in file drawer   TOTAL HIP ARTHROPLASTY Right 06/20/2022   Procedure: RIGHT TOTAL HIP ARTHROPLASTY ANTERIOR APPROACH;  Surgeon: Tarry Kos, MD;  Location: [facility] OR;  Service: Orthopedics;  Laterality: Right;  3-C   Social History   Socioeconomic History   Marital status: Married    Spouse name: [name]   Number of children: 2   Years of education: college   Highest education level: Not on file  Occupational History   Occupation: Chief Strategy Officer: Production assistant, radio FOR SELF EMPLOYED  Tobacco Use   Smoking status: Former    Current packs/day: 0.00    Types: Cigarettes    Start date: 07/24/1963    Quit date: 07/23/1968    Years since quitting: 55.1   Smokeless tobacco: Never   Tobacco comments:    pt quit July 1997  Vaping Use   Vaping status: Never Used  Substance and Sexual Activity   Alcohol use: Yes    Comment: 2-3days a week- 1 beer   Drug use: No   Sexual activity: Yes    Partners: Female  Other Topics Concern   Not on file  Social History Narrative   Patient is married [name]) and lives at home with his wife.   Patient has two adult children.   Patient is working full-time.   Patient has a college education.   Patient is right- handed.   Patient drinks two cups of coffee daily.   Social Drivers of Corporate investment banker Strain: Not on file  Food Insecurity: No Food Insecurity (06/20/2022)   Hunger Vital Sign    Worried About Running Out of Food in the Last Year: Never true     Ran Out of Food in the Last Year: Never true  Transportation Needs: No Transportation Needs (06/20/2022)   PRAPARE - Administrator, Civil Service (Medical): No    Lack of Transportation (Non-Medical): No  Physical Activity: Not on file  Stress: Not on file  Social Connections: Not on file   Family History  Problem Relation Age of Onset   Breast cancer Mother        after age 57   Congestive Heart Failure Mother    Coronary artery disease Father    Coronary artery disease Brother 80   Allergies  Allergen Reactions   Atorvastatin Other (See Comments)    Other Reaction(s): leg pains   Other Other (See Comments)    Tree & shrubs   Prior to Admission medications   Medication Sig Start Date End Date Taking? Authorizing Provider  aspirin EC 81 MG tablet Take 81 mg by mouth daily. Swallow whole.   Yes [provider]  doxycycline (VIBRA-TABS) 100 MG tablet Take 1 tablet (100 mg total) by mouth 2 (two) times daily. 09/11/23  Yes Cristie Hem, PA-C  ezetimibe (ZETIA) 10 MG tablet Take 1 tablet (10 mg total) by mouth daily. 05/11/21  Yes Dohmeier, Porfirio Mylar, MD  HYDROcodone-acetaminophen [facility]) [phone]  MG tablet Take 1 tablet by mouth at bedtime as needed. 10/11/22  Yes Tarry Kos, MD  MAGNESIUM PO Take 1 tablet by mouth daily.   Yes [provider]  omeprazole (PRILOSEC) 40 MG capsule Take 40 mg by mouth daily. 08/07/21  Yes [provider]  POTASSIUM PO Take 1 tablet by mouth daily.   Yes [provider]  REPATHA SURECLICK 140 MG/ML SOAJ Inject 140 mg into the skin every 14 (fourteen) days. 05/05/21  Yes [provider]  valsartan-hydrochlorothiazide (DIOVAN-HCT) 160-25 MG tablet Take 1 tablet by mouth daily. 08/29/23  Yes Yates Decamp, MD  amoxicillin (AMOXIL) 500 MG capsule Take 4 pills one hour prior to dental work 08/28/22   Cristie Hem, PA-C  doxycycline (VIBRA-TABS) 100 MG tablet Take 1 tablet (100 mg total) by mouth 2 (two)  times daily. 08/16/23   Cristie Hem, PA-C  methocarbamol (ROBAXIN-750) 750 MG tablet Take 1 tablet (750 mg total) by mouth 2 (two) times daily as needed for muscle spasms. 06/18/22   Cristie Hem, PA-C  mupirocin ointment (BACTROBAN) 2 % Apply 1 Application topically 2 (two) times daily. 08/01/22   Tarry Kos, MD     Positive ROS: All other systems have been reviewed and were otherwise negative with the exception of those mentioned in the HPI and as above.  Physical Exam: General: Alert, no acute distress Cardiovascular: No pedal edema Respiratory: No cyanosis, no use of accessory musculature GI: abdomen soft Skin: No lesions in the area of chief complaint Neurologic: Sensation intact distally Psychiatric: Patient is competent for consent with normal mood and affect Lymphatic: no lymphedema  MUSCULOSKELETAL: exam stable  Assessment: right hip surgical dehiscence  Plan: Plan for Procedure(s): IRRIGATION AND DEBRIDEMEN RIGHT HIP AND CLOSURE WOUND  The risks benefits and alternatives were discussed with the patient including but not limited to the risks of nonoperative treatment, versus surgical intervention including infection, bleeding, nerve injury,  blood clots, cardiopulmonary complications, morbidity, mortality, among others, and they were willing to proceed.   [name], MD 09/18/2023 2:01 PM

## 2023-09-18 NOTE — Op Note (Signed)
   Date of Surgery: 09/18/2023  INDICATIONS: Caleb Rogers is a 73 y.o.-year-old male with a right hip surgical wound dehiscence/abscess.  The patient did consent to the procedure after discussion of the risks and benefits.  PREOPERATIVE DIAGNOSIS: Right hip surgical wound dehiscence/abscess  POSTOPERATIVE DIAGNOSIS: Same.  PROCEDURE:  Irrigation and debridement of right hip surgical wound dehiscence/abscess including skin, subcutaneous tissue, fascia 70 cm Secondary closure of right hip surgical wound dehiscence  Debridement type: Excisional Debridement  Side: right  Body Location: hip/thigh   Tools used for debridement: scalpel and rongeur  Debridement depth beyond dead/damaged tissue down to healthy viable tissue: yes  Tissue layer involved: skin, subcutaneous tissue, muscle / fascia  Nature of tissue removed: Slough, Devitalized Tissue, Non-viable tissue, and Purulence  Irrigation volume: 6 liters     Irrigation fluid type: Normal Saline  SURGEON: Caleb Rogers, M.D.  ASSIST: Caleb Rogers, [place]; necessary for the timely completion of procedure and due to complexity of procedure.  ANESTHESIA:  general  IV FLUIDS AND URINE: See anesthesia.  ESTIMATED BLOOD LOSS: 25 mL.  IMPLANTS: None  DRAINS: None  COMPLICATIONS: see description of procedure.  DESCRIPTION OF PROCEDURE: The patient was brought to the operating room.  The patient had been signed prior to the procedure and this was documented. The patient had the anesthesia placed by the anesthesiologist.  A time-out was performed to confirm that this was the correct patient, site, side and location. The patient did receive antibiotics prior to the incision and was re-dosed during the procedure as needed at indicated intervals. The patient had the operative extremity prepped and draped in the standard surgical fashion.    A 10 blade was used to sharply excise nonviable skin and subcutaneous tissue.  There was  approximately 5 cc of purulent fluid that was expressed.  This was cultured.  The abscess was in the subcutaneous pouch.  I traced the wound down to the level of the fascia.  I carefully inspected the tissue and did not see any evidence of penetration deep to the fascia.  I then performed sharp excisional debridement of the skin, subcutaneous tissue and the fascia with a combination of knife and rondure.  Altogether approximately 70 cm was debrided back to healthy bleeding tissue.  Hemostasis was obtained.  6 L of normal saline was then irrigated through the wound.  I then placed a gram of vancomycin powder throughout the tissue bed.  Secondary closure of the wound dehiscence was performed using 0 PDS for the fascial layer, 2-0 Monocryl for the subcutaneous and subcuticular layers and 2-0 nylon for the skin.  An Aquacel bandage was placed.  Patient tolerated procedure well had no immediate complications.  Caleb Rogers was necessary for opening, closing, retracting, limb positioning and overall facilitation and timely completion of the procedure.  POSTOPERATIVE PLAN: Patient will be discharged home.  He is to follow-up in 2 weeks for suture removal.  I will put him back on doxycycline for 4 weeks.  Will follow-up on the intraoperative cultures.  Caleb Reel, MD 2:51 PM

## 2023-09-18 NOTE — Transfer of Care (Signed)
 Immediate Anesthesia Transfer of Care Note  Patient: Caleb Rogers  Procedure(s) Performed: IRRIGATION AND DEBRIDEMEN RIGHT HIP AND CLOSURE WOUND (Right: Hip)  Patient Location: PACU  Anesthesia Type:MAC  Level of Consciousness: drowsy  Airway & Oxygen Therapy: Patient Spontanous Breathing and Patient connected to face mask oxygen  Post-op Assessment: Report given to RN and Post -op Vital signs reviewed and stable  Post vital signs: Reviewed and stable  Last Vitals:  Vitals Value Taken Time  BP 98/57 09/18/23 1506  Temp    Pulse 43 09/18/23 1506  Resp 10 09/18/23 1506  SpO2 95 % 09/18/23 1506    Last Pain:  Vitals:   09/18/23 1332  TempSrc: Temporal  PainSc: 0-No pain         Complications: No notable events documented.

## 2023-09-18 NOTE — Discharge Instructions (Addendum)
  Post Anesthesia Home Care Instructions  Activity: Get plenty of rest for the remainder of the day. A responsible individual must stay with you for 24 hours following the procedure.  For the next 24 hours, DO NOT: -Drive a car -Advertising copywriter -Drink alcoholic beverages -Take any medication unless instructed by your physician -Make any legal decisions or sign important papers.  Meals: Start with liquid foods such as gelatin or soup. Progress to regular foods as tolerated. Avoid greasy, spicy, heavy foods. If nausea and/or vomiting occur, drink only clear liquids until the nausea and/or vomiting subsides. Call your physician if vomiting continues.  Special Instructions/Symptoms: Your throat may feel dry or sore from the anesthesia or the breathing tube placed in your throat during surgery. If this causes discomfort, gargle with warm salt water. The discomfort should disappear within 24 hours.  Keep your wound covered with the bandage that was placed during surgery. You may shower but do not take baths. You may use ice over the wound for swelling and pain. Make sure there is a barrier between your skin and the ice pack. Your bandage will be checked at your follow-up appointment.   No tylenol until after 7:30pm tonight if needed.

## 2023-09-18 NOTE — Anesthesia Preprocedure Evaluation (Addendum)
 Anesthesia Evaluation  Patient identified by MRN, date of birth, ID band Patient awake    Reviewed: Allergy & Precautions, NPO status , Patient's Chart, lab work & pertinent test results  Airway Mallampati: III  TM Distance: >3 FB Neck ROM: Full    Dental  (+) Chipped,    Pulmonary sleep apnea and Continuous Positive Airway Pressure Ventilation , former smoker   Pulmonary exam normal        Cardiovascular hypertension, Pt. on medications Normal cardiovascular exam     Neuro/Psych TIA negative psych ROS   GI/Hepatic Neg liver ROS,GERD  Medicated and Controlled,,  Endo/Other  negative endocrine ROS    Renal/GU negative Renal ROS     Musculoskeletal  (+) Arthritis ,    Abdominal   Peds  Hematology negative hematology ROS (+)   Anesthesia Other Findings right hip surgical dehiscence  Reproductive/Obstetrics                             Anesthesia Physical Anesthesia Plan  ASA: 3  Anesthesia Plan: MAC   Post-op Pain Management:    Induction: Intravenous  PONV Risk Score and Plan: 1 and Ondansetron, Dexamethasone, Propofol infusion, Treatment may vary due to age or medical condition and Midazolam  Airway Management Planned: Simple Face Mask  Additional Equipment:   Intra-op Plan:   Post-operative Plan:   Informed Consent: I have reviewed the patients History and Physical, chart, labs and discussed the procedure including the risks, benefits and alternatives for the proposed anesthesia with the patient or authorized representative who has indicated his/her understanding and acceptance.     Dental advisory given  Plan Discussed with: CRNA  Anesthesia Plan Comments:        Anesthesia Quick Evaluation

## 2023-09-18 NOTE — Progress Notes (Unsigned)
 Marland Kitchen

## 2023-09-18 NOTE — Anesthesia Procedure Notes (Signed)
 Procedure Name: MAC Date/Time: 09/18/2023 2:02 PM  Performed by: Yolanda Bonine, CRNAPre-anesthesia Checklist: Patient identified, Emergency Drugs available, Suction available, Patient being monitored and Timeout performed Patient Re-evaluated:Patient Re-evaluated prior to induction Oxygen Delivery Method: Simple face mask Preoxygenation: Pre-oxygenation with 100% oxygen Induction Type: IV induction Airway Equipment and Method: Oral airway Placement Confirmation: positive ETCO2 Dental Injury: Teeth and Oropharynx as per pre-operative assessment

## 2023-09-19 ENCOUNTER — Encounter (HOSPITAL_BASED_OUTPATIENT_CLINIC_OR_DEPARTMENT_OTHER): Payer: Self-pay | Admitting: Orthopaedic Surgery

## 2023-09-19 ENCOUNTER — Encounter: Payer: Self-pay | Admitting: Orthopaedic Surgery

## 2023-09-19 ENCOUNTER — Ambulatory Visit: Payer: Medicare HMO | Admitting: Neurology

## 2023-09-19 VITALS — BP 130/82 | HR 60 | Ht 70.0 in | Wt 201.8 lb

## 2023-09-19 DIAGNOSIS — G4733 Obstructive sleep apnea (adult) (pediatric): Secondary | ICD-10-CM

## 2023-09-19 DIAGNOSIS — Z9989 Dependence on other enabling machines and devices: Secondary | ICD-10-CM

## 2023-09-19 NOTE — Patient Instructions (Signed)
 Monoclonal Gammopathy of Undetermined Significance Monoclonal gammopathy of undetermined significance (MGUS) is a condition in which there is too much of a protein called monoclonal protein, or M protein, in the blood. MGUS can cause you to have too many cells in your blood and not enough space for healthy cells. This condition may not have any symptoms, but it may increase your risk of developing multiple myeloma or other blood disorders in the future. What are the causes? The cause of this condition is not known. Genetics and the environment may play a role. What increases the risk? You are more likely to develop this condition if: You are African American. You are age 33 or older. You are male. You have an autoimmune disease. You have been exposed to radiation. You have a family history of MGUS. What are the signs or symptoms? There are usually no symptoms of this condition. In rare cases, some people may have: Numbness or tingling in the hands, lower legs, or feet. Bone problems. Frequent infections. How is this diagnosed? This condition may be diagnosed with tests that check for the M protein, such as: A blood test. A urine test. How is this treated? Treatment may involve monitoring your condition. This may include: Having regular exams. This will allow your health care provider to monitor your health. Having tests done regularly, such as: Blood tests to check for M protein in your body. Imaging tests, such as a CT scan. A bone marrow biopsy. This test involves taking a sample of bone marrow from your body so it can be checked under a microscope. Follow these instructions at home: Take over-the-counter and prescription medicines only as told by your health care provider. Keep all follow-up visits. Where to find more information [employer]: myeloma.org Contact a health care provider if: You have trouble swallowing. You have pain in your back or ribs. You  have numbness or tingling in your hands, lower legs, or feet. You have a fever. You are bruising easily. Get help right away if: You break a bone. You have trouble breathing. Summary Monoclonal gammopathy of undetermined significance (MGUS) is a condition in which there is too much of a protein called monoclonal protein, or M protein, in the blood. This condition may be diagnosed with a blood test that checks for M protein. Treatment for this condition may involve having tests done regularly. Tests may include blood tests, imaging tests, and a bone marrow biopsy. This information is not intended to replace advice given to you by your health care provider. Make sure you discuss any questions you have with your health care provider. Document Revised: 08/16/2021 Document Reviewed: 08/16/2021 Elsevier Patient Education  2024 [employer].

## 2023-09-19 NOTE — Progress Notes (Signed)
 Provider:  Melvyn Novas, MD  Primary Care Physician:  Rodrigo Ran, MD [address] [place] [address]     Referring Provider: Rodrigo Ran, Md [address] [place],  [place] [address]          Chief Complaint according to patient   Patient presents with:                HISTORY OF PRESENT ILLNESS:  Caleb Rogers is a 73 y.o. male patient who is here for revisit 09/19/2023 for CPAP compliance , once yearly. .  Chief concern according to patient :  NONEthe patient brought his mask and headgear to the office after this visit., He uses a LARGE size Vear Clock made [employer] nasal cradle.   Mr. Merlos used his CPAP machine 30 out of 30 days and 22 of these days over 4 hours, the average use of time is 5 hours 6 minutes, he is using a 2 cm pressure relief and a setting between a minimum pressure of 5 and a maximum of 12 cm water.  He also achieved a residual AHI of only 0.2/h at the 95th percentile pressure of 10.8 cm water.  Air leak at the 91st percentile was 6.4 L a minute this is considered low.     Monoclonal Gammopathy, MGUS, anemia. Repatha for hyperlipidemia, didn't tolerate any statins   Caleb Rogers is a 73 y.o. male  and  seen after he started therapy on a new device, 09-12-2021.   The patient showed noncompliance for the months of December and until mid January but then compliance dropped off and he is not quite sure why.  He uses the machine on average 5 hours at night but he only used CPAP for 75 out of 90 days which is an 83% compliance and only 43 days over 4 hours.  The residual AHI is 0.4 for this patient so he has excellent resolution of apnea the 95th percentile pressure is 9.6 cm water and there is no significant air leakage.  I like for him to continue daily CPAP use for at least 4 hours.  Extensions are bronchitis sinusitis. -and he tells me now that he actually was sick for a while in early February.   His Epworth Sleepiness  Scale was endorsed at 8 out of 24 points his fatigue severity at 23 out of 63 points and his depression score was 0 out of 15. Fatigue, sleepiness - he had a hip replacement in February and developed anemia.  Now taking Flintstone iron multi mineral gummis.     Review of Systems: Out of a complete 14 system review, the patient complains of only the following symptoms, and all other reviewed systems are negative.:   Social History   Socioeconomic History   Marital status: Married    Spouse name: [name]   Number of children: 2   Years of education: college   Highest education level: Not on file  Occupational History   Occupation: Chief Strategy Officer: Production assistant, radio FOR SELF EMPLOYED  Tobacco Use   Smoking status: Former    Current packs/day: 0.00    Types: Cigarettes    Start date: 07/24/1963    Quit date: 07/23/1968    Years since quitting: 55.1   Smokeless tobacco: Never   Tobacco comments:    pt quit July 1997  Vaping Use   Vaping status: Never Used  Substance and Sexual Activity   Alcohol use:  Yes    Comment: 2-3days a week- 1 beer   Drug use: No   Sexual activity: Yes    Partners: Female  Other Topics Concern   Not on file  Social History Narrative   Patient is married [name]) and lives at home with his wife.   Patient has two adult children.   Patient is working full-time.   Patient has a college education.   Patient is right- handed.   Patient drinks two cups of coffee daily.   Social Drivers of Corporate investment banker Strain: Not on file  Food Insecurity: No Food Insecurity (06/20/2022)   Hunger Vital Sign    Worried About Running Out of Food in the Last Year: Never true    Ran Out of Food in the Last Year: Never true  Transportation Needs: No Transportation Needs (06/20/2022)   PRAPARE - Administrator, Civil Service (Medical): No    Lack of Transportation (Non-Medical): No  Physical Activity: Not on file  Stress: Not on file  Social  Connections: Not on file    Family History  Problem Relation Age of Onset   Breast cancer Mother        after age 18   Congestive Heart Failure Mother    Coronary artery disease Father    Coronary artery disease Brother 62    Past Medical History:  Diagnosis Date   Gait instability 08/23/2013   GERD (gastroesophageal reflux disease)    Hypertension    Impaired fasting glucose    OSA (obstructive sleep apnea) 02/24/2014   Pure hypercholesterolemia    Skin cancer of face    not melanoma   Sleep apnea    TIA (transient ischemic attack)    2015    Past Surgical History:  Procedure Laterality Date   COLONOSCOPY     DEBRIDEMENT AND CLOSURE WOUND Right 09/18/2023   Procedure: IRRIGATION AND DEBRIDEMEN RIGHT HIP AND CLOSURE WOUND;  Surgeon: Tarry Kos, MD;  Location: [facility];  Service: Orthopedics;  Laterality: Right;   none     TEE WITHOUT CARDIOVERSION N/A 11/11/2014   Procedure: TRANSESOPHAGEAL ECHOCARDIOGRAM (TEE);  Surgeon: Yates Decamp, MD;  Location: [facility] ENDOSCOPY;  Service: Cardiovascular;  Laterality: N/A;  h/p in file drawer   TOTAL HIP ARTHROPLASTY Right 06/20/2022   Procedure: RIGHT TOTAL HIP ARTHROPLASTY ANTERIOR APPROACH;  Surgeon: Tarry Kos, MD;  Location: [facility] OR;  Service: Orthopedics;  Laterality: Right;  3-C     Current Outpatient Medications on File Prior to Visit  Medication Sig Dispense Refill   aspirin EC 81 MG tablet Take 81 mg by mouth daily. Swallow whole.     doxycycline (VIBRA-TABS) 100 MG tablet Take 1 tablet (100 mg total) by mouth 2 (two) times daily for 28 days. 56 tablet 0   ezetimibe (ZETIA) 10 MG tablet Take 1 tablet (10 mg total) by mouth daily. 30 tablet 0   HYDROcodone-acetaminophen (NORCO) 5-325 MG tablet Take 1 tablet by mouth at bedtime as needed. 10 tablet 0   MAGNESIUM PO Take 1 tablet by mouth daily.     mupirocin ointment (BACTROBAN) 2 % Apply 1 Application topically 2 (two) times daily. 22 g 2   omeprazole  (PRILOSEC) 40 MG capsule Take 40 mg by mouth daily.     POTASSIUM PO Take 1 tablet by mouth daily.     REPATHA SURECLICK 140 MG/ML SOAJ Inject 140 mg into the skin every 14 (fourteen) days.  valsartan-hydrochlorothiazide (DIOVAN-HCT) 160-25 MG tablet Take 1 tablet by mouth daily. 15 tablet 0   amoxicillin (AMOXIL) 500 MG capsule Take 4 pills one hour prior to dental work (Patient not taking: Reported on 09/19/2023) 8 capsule 2   doxycycline (VIBRA-TABS) 100 MG tablet Take 1 tablet (100 mg total) by mouth 2 (two) times daily. (Patient not taking: Reported on 09/19/2023) 20 tablet 0   methocarbamol (ROBAXIN-750) 750 MG tablet Take 1 tablet (750 mg total) by mouth 2 (two) times daily as needed for muscle spasms. (Patient not taking: Reported on 09/19/2023) 30 tablet 2   No current facility-administered medications on file prior to visit.    Allergies  Allergen Reactions   Atorvastatin Other (See Comments)    Other Reaction(s): leg pains   Other Other (See Comments)    Tree & shrubs     DIAGNOSTIC DATA (LABS, IMAGING, TESTING) - I reviewed patient records, labs, notes, testing and imaging myself where available.  Lab Results  Component Value Date   WBC 6.9 06/25/2023   HGB 13.2 06/25/2023   HCT 38.4 (L) 06/25/2023   MCV 85.7 06/25/2023   PLT 365 06/25/2023      Component Value Date/Time   NA 141 09/16/2023 1330   K 4.3 09/16/2023 1330   CL 105 09/16/2023 1330   CO2 23 09/16/2023 1330   GLUCOSE 93 09/16/2023 1330   BUN 18 09/16/2023 1330   CREATININE 0.84 09/16/2023 1330   CREATININE 0.81 06/25/2023 1200   CALCIUM 9.3 09/16/2023 1330   PROT 8.0 06/25/2023 1200   ALBUMIN 4.2 06/25/2023 1200   AST 20 06/25/2023 1200   ALT 26 06/25/2023 1200   ALKPHOS 100 06/25/2023 1200   BILITOT 0.5 06/25/2023 1200   GFRNONAA >60 09/16/2023 1330   GFRNONAA >60 06/25/2023 1200   GFRAA >90 09/16/2013 1438   Lab Results  Component Value Date   CHOL 213 (H) 09/17/2013   HDL 61 09/17/2013    LDLCALC 127 (H) 09/17/2013   TRIG 127 09/17/2013   CHOLHDL 3.5 09/17/2013   Lab Results  Component Value Date   HGBA1C 5.8 (H) 09/16/2013   Lab Results  Component Value Date   VITAMINB12 412 11/28/2022   Lab Results  Component Value Date   TSH 1.354 12/25/2022    PHYSICAL EXAM:  Today's Vitals   09/19/23 1310  BP: 130/82  Pulse: 60  Weight: 201 lb 12.8 oz (91.5 kg)  Height: 5\' 10"  (1.778 m)   Body mass index is 28.96 kg/m.   Wt Readings from Last 3 Encounters:  09/19/23 201 lb 12.8 oz (91.5 kg)  09/18/23 197 lb 5 oz (89.5 kg)  07/03/23 193 lb 9.6 oz (87.8 kg)     Ht Readings from Last 3 Encounters:  09/19/23 5\' 10"  (1.778 m)  09/18/23 5' 10.5" (1.791 m)  12/18/22 5\' 10"  (1.778 m)      The patient is awake, alert and appears not in acute distress. The patient is well groomed. Head: Normocephalic, atraumatic. Neck is supple. Mallampati 2-3, red and  elongated uvula,  no TMJ and no lateral restriction. ,  neck circumference: 16." Tongue in midline.    Cardiovascular:  Regular rate and rhythm, without  murmurs or carotid bruit, and without distended neck veins. Skin:  Without evidence of edema, or rash, facial redness.  Trunk: BMI has risen to 28 from 26 , patient has normal posture.   Neurologic exam : The patient is awake and alert, oriented to place and time.  Memory subjective described as intact. There is a normal attention span & concentration ability.  Speech is fluent without  dysarthria, dysphonia or aphasia.  Mood and affect are appropriate, he is talkative. .   Cranial nerves: Pupils are equal and briskly reactive to light.  Extraocular movements  in vertical and horizontal planes intact and without nystagmus. Visual fields by finger perimetry are intact. Hearing to finger rub intact. Facial sensation intact to fine touch. Facial motor strength is symmetric and tongue and uvula move midline.   Motor exam:  Normal tone and normal muscle bulk and  symmetric normal strength in all extremities. Symmetric strength.   Tandem gait unfragmented. Romberg testing is normal.   Deep tendon reflexes: in the upper and lower extremities are symmetric and intact.  Babinski maneuver response is downgoing.      ASSESSMENT AND PLAN:  73 y.o. year old male  here with:  OSA on CPAP, history of  Thoracic aortic ectasia , CAD, hyperlipidemia and Myopathy in response to statins, now on Repatha. Post operative anemia,     1) Excellent response to  CPAP therapy ,  machine issued in 2024.   2) Anemia corrected on FLINTSTONES ( iron and multi -mineral )   3) Rv in 12 months.   I would like to thank Rodrigo Ran, Md [address] [place],  [place] [address] for allowing me to meet with and to take care of this pleasant patient.   After spending a total time of  21  minutes face to face and additional time for physical and neurologic examination, review of laboratory studies,  personal review of imaging studies, reports and results of other testing and review of referral information / records as far as provided in visit,   Electronically signed by: Melvyn Novas, MD 09/19/2023 1:31 PM  [facility] Sleep Board certified by The [employer] of Sleep Medicine and Diplomate of the [employer] of Sleep Medicine. Board certified In Neurology through the ABPN, Fellow of the [employer] of Neurology.

## 2023-09-23 DIAGNOSIS — R1310 Dysphagia, unspecified: Secondary | ICD-10-CM | POA: Diagnosis not present

## 2023-09-23 DIAGNOSIS — K449 Diaphragmatic hernia without obstruction or gangrene: Secondary | ICD-10-CM | POA: Diagnosis not present

## 2023-09-23 DIAGNOSIS — K2100 Gastro-esophageal reflux disease with esophagitis, without bleeding: Secondary | ICD-10-CM | POA: Diagnosis not present

## 2023-09-23 LAB — AEROBIC/ANAEROBIC CULTURE W GRAM STAIN (SURGICAL/DEEP WOUND)

## 2023-09-25 ENCOUNTER — Other Ambulatory Visit: Payer: Self-pay | Admitting: Physician Assistant

## 2023-09-25 ENCOUNTER — Telehealth: Payer: Self-pay | Admitting: *Deleted

## 2023-09-25 NOTE — Telephone Encounter (Signed)
(  This is a late entry for 90 day post op call for Right total hip). Ortho bundle calls completed.

## 2023-09-26 ENCOUNTER — Telehealth: Payer: Self-pay | Admitting: Hematology and Oncology

## 2023-09-26 ENCOUNTER — Ambulatory Visit: Payer: Medicare HMO | Admitting: Physician Assistant

## 2023-09-26 DIAGNOSIS — S71001D Unspecified open wound, right hip, subsequent encounter: Secondary | ICD-10-CM

## 2023-09-26 MED ORDER — MUPIROCIN 2 % EX OINT: 1.0000 | TOPICAL_OINTMENT | Freq: Two times a day (BID) | CUTANEOUS | 0 refills | Status: DC

## 2023-09-26 NOTE — Progress Notes (Signed)
 Post-Op Visit Note   Patient: Caleb Rogers           Date of Birth: 03-03-1951           MRN: [id number] Visit Date: 09/26/2023 PCP: Rodrigo Ran, MD   Assessment & Plan:  Chief Complaint:  Chief Complaint  Patient presents with   Right Hip - Routine Post Op   Visit Diagnoses:  1. Open wound of right hip, subsequent encounter     Plan: Patient is a pleasant 73 year old gentleman who comes in today one week status post I&D right superficial hip wound, 09/18/2023.  Intraoperative cultures grew out MRSA.  He has been doing well.  He does tell me he noticed some bloody drainage from the wounds over the weekend.  He changed the bandages and has not noticed any more drainage.  No fevers or chills.  He has been compliant taking doxycycline.  Examination of the right hip reveals a well healing surgical incision with nylon sutures in place.  No active drainage.  No erythema or signs of infection or cellulitis.  He does have a small area medial to the incision where it appears an area of the skin has been removed.  At this point, it is too early to remove the sutures.  Wound was cleaned and recovered.  Superficial abrasion was covered with mupirocin and a Band-Aid.  He will do this twice daily at home.  He will continue with his antibiotics for total of 4 weeks postop.  He will follow-up next week for suture removal.  Call with concerns or questions in the meantime.  Follow-Up Instructions: Return in about 1 week (around 10/03/2023).   Orders:  No orders of the defined types were placed in this encounter.  Meds ordered this encounter  Medications   mupirocin ointment (BACTROBAN) 2 %    Sig: Apply 1 Application topically 2 (two) times daily. Apply twice daily and cover with a band aid    Dispense:  22 g    Refill:  0    Imaging: No new imaging  PMFS History: Patient Active Problem List   Diagnosis Date Noted   Open wound of right hip and thigh 09/18/2023   Status post total  replacement of right hip 06/20/2022   Primary osteoarthritis of right hip 05/18/2022   Loud snoring 05/11/2021   Hypersomnia with sleep apnea 05/11/2021   CPAP (continuous positive airway pressure) dependence 05/11/2021   OSA on CPAP 09/27/2014   OSA (obstructive sleep apnea) 02/24/2014   Gait instability 09/16/2013   Hypertension 09/16/2013   Hyperlipidemia 09/16/2013   TIA (transient ischemic attack) 09/16/2013   Past Medical History:  Diagnosis Date   Gait instability 08/23/2013   GERD (gastroesophageal reflux disease)    Hypertension    Impaired fasting glucose    OSA (obstructive sleep apnea) 02/24/2014   Pure hypercholesterolemia    Skin cancer of face    not melanoma   Sleep apnea    TIA (transient ischemic attack)    2015    Family History  Problem Relation Age of Onset   Breast cancer Mother        after age 19   Congestive Heart Failure Mother    Coronary artery disease Father    Coronary artery disease Brother 57    Past Surgical History:  Procedure Laterality Date   COLONOSCOPY     DEBRIDEMENT AND CLOSURE WOUND Right 09/18/2023   Procedure: IRRIGATION AND DEBRIDEMEN RIGHT HIP AND CLOSURE WOUND;  Surgeon: Tarry Kos, MD;  Location: [facility];  Service: Orthopedics;  Laterality: Right;   none     TEE WITHOUT CARDIOVERSION N/A 11/11/2014   Procedure: TRANSESOPHAGEAL ECHOCARDIOGRAM (TEE);  Surgeon: Yates Decamp, MD;  Location: [facility] ENDOSCOPY;  Service: Cardiovascular;  Laterality: N/A;  h/p in file drawer   TOTAL HIP ARTHROPLASTY Right 06/20/2022   Procedure: RIGHT TOTAL HIP ARTHROPLASTY ANTERIOR APPROACH;  Surgeon: Tarry Kos, MD;  Location: [facility];  Service: Orthopedics;  Laterality: Right;  3-C   Social History   Occupational History   Occupation: Chief Strategy Officer: Production assistant, radio FOR SELF EMPLOYED  Tobacco Use   Smoking status: Former    Current packs/day: 0.00    Types: Cigarettes    Start date: 07/24/1963    Quit date: 07/23/1968     Years since quitting: 55.2   Smokeless tobacco: Never   Tobacco comments:    pt quit July 1997  Vaping Use   Vaping status: Never Used  Substance and Sexual Activity   Alcohol use: Yes    Comment: 2-3days a week- 1 beer   Drug use: No   Sexual activity: Yes    Partners: Female

## 2023-10-01 ENCOUNTER — Inpatient Hospital Stay: Payer: Medicare HMO

## 2023-10-01 DIAGNOSIS — K449 Diaphragmatic hernia without obstruction or gangrene: Secondary | ICD-10-CM | POA: Diagnosis not present

## 2023-10-01 DIAGNOSIS — R1310 Dysphagia, unspecified: Secondary | ICD-10-CM | POA: Diagnosis not present

## 2023-10-02 ENCOUNTER — Inpatient Hospital Stay: Attending: Physician Assistant

## 2023-10-02 ENCOUNTER — Other Ambulatory Visit: Payer: Self-pay | Admitting: Hematology and Oncology

## 2023-10-02 DIAGNOSIS — D472 Monoclonal gammopathy: Secondary | ICD-10-CM | POA: Insufficient documentation

## 2023-10-02 LAB — CMP (CANCER CENTER ONLY)
ALT: 20 U/L (ref 0–44)
AST: 21 U/L (ref 15–41)
Albumin: 4.1 g/dL (ref 3.5–5.0)
Alkaline Phosphatase: 79 U/L (ref 38–126)
Anion gap: 5 (ref 5–15)
BUN: 24 mg/dL — ABNORMAL HIGH (ref 8–23)
CO2: 29 mmol/L (ref 22–32)
Calcium: 9.1 mg/dL (ref 8.9–10.3)
Chloride: 103 mmol/L (ref 98–111)
Creatinine: 0.85 mg/dL (ref 0.61–1.24)
GFR, Estimated: >60 mL/min (ref >60)
Glucose, Bld: 103 mg/dL — ABNORMAL HIGH (ref 70–99)
Potassium: 3.9 mmol/L (ref 3.5–5.1)
Sodium: 137 mmol/L (ref 135–145)
Total Bilirubin: 0.5 mg/dL (ref 0.0–1.2)
Total Protein: 7.2 g/dL (ref 6.5–8.1)

## 2023-10-02 LAB — CBC WITH DIFFERENTIAL (CANCER CENTER ONLY)
Abs Immature Granulocytes: 0.01 10*3/uL (ref 0.00–0.07)
Basophils Absolute: 0 10*3/uL (ref 0.0–0.1)
Basophils Relative: 0 %
Eosinophils Absolute: 0.2 10*3/uL (ref 0.0–0.5)
Eosinophils Relative: 3 %
HCT: 37.4 % — ABNORMAL LOW (ref 39.0–52.0)
Hemoglobin: 12.8 g/dL — ABNORMAL LOW (ref 13.0–17.0)
Immature Granulocytes: 0 %
Lymphocytes Relative: 30 %
Lymphs Abs: 2.1 10*3/uL (ref 0.7–4.0)
MCH: 29.8 pg (ref 26.0–34.0)
MCHC: 34.2 g/dL (ref 30.0–36.0)
MCV: 87.2 fL (ref 80.0–100.0)
Monocytes Absolute: 0.7 10*3/uL (ref 0.1–1.0)
Monocytes Relative: 10 %
Neutro Abs: 3.9 10*3/uL (ref 1.7–7.7)
Neutrophils Relative %: 57 %
Platelet Count: 332 10*3/uL (ref 150–400)
RBC: 4.29 MIL/uL (ref 4.22–5.81)
RDW: 14.1 % (ref 11.5–15.5)
WBC Count: 6.9 10*3/uL (ref 4.0–10.5)
nRBC: 0 % (ref 0.0–0.2)

## 2023-10-02 LAB — LACTATE DEHYDROGENASE: LDH: 145 U/L (ref 98–192)

## 2023-10-03 ENCOUNTER — Ambulatory Visit: Admitting: Physician Assistant

## 2023-10-03 DIAGNOSIS — Z85828 Personal history of other malignant neoplasm of skin: Secondary | ICD-10-CM | POA: Diagnosis not present

## 2023-10-03 DIAGNOSIS — D225 Melanocytic nevi of trunk: Secondary | ICD-10-CM | POA: Diagnosis not present

## 2023-10-03 DIAGNOSIS — D239 Other benign neoplasm of skin, unspecified: Secondary | ICD-10-CM | POA: Diagnosis not present

## 2023-10-03 DIAGNOSIS — Z129 Encounter for screening for malignant neoplasm, site unspecified: Secondary | ICD-10-CM | POA: Diagnosis not present

## 2023-10-03 DIAGNOSIS — S71001D Unspecified open wound, right hip, subsequent encounter: Secondary | ICD-10-CM

## 2023-10-03 DIAGNOSIS — L821 Other seborrheic keratosis: Secondary | ICD-10-CM | POA: Diagnosis not present

## 2023-10-03 LAB — KAPPA/LAMBDA LIGHT CHAINS
Kappa free light chain: 21.2 mg/L — ABNORMAL HIGH (ref 3.3–19.4)
Kappa, lambda light chain ratio: 0.16 — ABNORMAL LOW (ref 0.26–1.65)
Lambda free light chains: 133.4 mg/L — ABNORMAL HIGH (ref 5.7–26.3)

## 2023-10-03 NOTE — Progress Notes (Signed)
 Post-Op Visit Note   Patient: Caleb Rogers           Date of Birth: [birth year]           MRN: [id number] Visit Date: 10/03/2023 PCP: Rodrigo Ran, MD   Assessment & Plan:  Chief Complaint:  Chief Complaint  Patient presents with   Right Hip - Routine Post Op, Follow-up   Visit Diagnoses:  1. Open wound of right hip, subsequent encounter     Plan: Patient is a pleasant 73 year old gentleman who comes in today approximately 2 weeks status post I&D right superficial hip wound 10/16/2023.  Intraoperative cultures grew out MRSA.  He has been on doxycycline.  No fevers or chills or any other constitutional symptoms.  No pain.  Examination of the right hip wound: Well approximated incision with nylon sutures in place.  No active drainage.  No evidence of cellulitis or infection.  He still has the very small pea-sized superficial abrasion medial to the incision.  This has improved over the past week.  Today, sutures were removed and Steri-Strips applied.  He will continue applying mupirocin and Band-Aid twice daily to the superficial area medial to the incision.  Continue with his antibiotics for another 2 weeks.  He will follow-up with [place] in 2 weeks for recheck.  Call with concerns or questions.  Follow-Up Instructions: Return in about 2 weeks (around 10/17/2023).   Orders:  No orders of the defined types were placed in this encounter.  No orders of the defined types were placed in this encounter.   Imaging: No new imaging  PMFS History: Patient Active Problem List   Diagnosis Date Noted   Open wound of right hip and thigh 09/18/2023   Status post total replacement of right hip 06/20/2022   Primary osteoarthritis of right hip 05/18/2022   Loud snoring 05/11/2021   Hypersomnia with sleep apnea 05/11/2021   CPAP (continuous positive airway pressure) dependence 05/11/2021   OSA on CPAP 09/27/2014   OSA (obstructive sleep apnea) 02/24/2014   Gait instability 09/16/2013    Hypertension 09/16/2013   Hyperlipidemia 09/16/2013   TIA (transient ischemic attack) 09/16/2013   Past Medical History:  Diagnosis Date   Gait instability 08/23/2013   GERD (gastroesophageal reflux disease)    Hypertension    Impaired fasting glucose    OSA (obstructive sleep apnea) 02/24/2014   Pure hypercholesterolemia    Skin cancer of face    not melanoma   Sleep apnea    TIA (transient ischemic attack)    2015    Family History  Problem Relation Age of Onset   Breast cancer Mother        after age 74   Congestive Heart Failure Mother    Coronary artery disease Father    Coronary artery disease Brother 89    Past Surgical History:  Procedure Laterality Date   COLONOSCOPY     DEBRIDEMENT AND CLOSURE WOUND Right 09/18/2023   Procedure: IRRIGATION AND DEBRIDEMEN RIGHT HIP AND CLOSURE WOUND;  Surgeon: Tarry Kos, MD;  Location: [facility];  Service: Orthopedics;  Laterality: Right;   none     TEE WITHOUT CARDIOVERSION N/A 11/11/2014   Procedure: TRANSESOPHAGEAL ECHOCARDIOGRAM (TEE);  Surgeon: Yates Decamp, MD;  Location: [facility] ENDOSCOPY;  Service: Cardiovascular;  Laterality: N/A;  h/p in file drawer   TOTAL HIP ARTHROPLASTY Right 06/20/2022   Procedure: RIGHT TOTAL HIP ARTHROPLASTY ANTERIOR APPROACH;  Surgeon: Tarry Kos, MD;  Location: [facility];  Service: Orthopedics;  Laterality: Right;  3-C   Social History   Occupational History   Occupation: Chief Strategy Officer: Production assistant, radio FOR SELF EMPLOYED  Tobacco Use   Smoking status: Former    Current packs/day: 0.00    Types: Cigarettes    Start date: 07/24/1963    Quit date: 07/23/1968    Years since quitting: 55.2   Smokeless tobacco: Never   Tobacco comments:    pt quit July 1997  Vaping Use   Vaping status: Never Used  Substance and Sexual Activity   Alcohol use: Yes    Comment: 2-3days a week- 1 beer   Drug use: No   Sexual activity: Yes    Partners: Female

## 2023-10-04 LAB — MULTIPLE MYELOMA PANEL, SERUM
Albumin SerPl Elph-Mcnc: 3.4 g/dL (ref 2.9–4.4)
Albumin/Glob SerPl: 1.1 (ref 0.7–1.7)
Alpha 1: 0.3 g/dL (ref 0.0–0.4)
Alpha2 Glob SerPl Elph-Mcnc: 0.8 g/dL (ref 0.4–1.0)
B-Globulin SerPl Elph-Mcnc: 1.1 g/dL (ref 0.7–1.3)
Gamma Glob SerPl Elph-Mcnc: 1.1 g/dL (ref 0.4–1.8)
Globulin, Total: 3.3 g/dL (ref 2.2–3.9)
IgA: 334 mg/dL (ref 61–437)
IgG (Immunoglobin G), Serum: 1182 mg/dL (ref 603–1613)
IgM (Immunoglobulin M), Srm: 66 mg/dL (ref 15–143)
Total Protein ELP: 6.7 g/dL (ref 6.0–8.5)

## 2023-10-17 ENCOUNTER — Ambulatory Visit (INDEPENDENT_AMBULATORY_CARE_PROVIDER_SITE_OTHER): Admitting: Physician Assistant

## 2023-10-17 DIAGNOSIS — S71001D Unspecified open wound, right hip, subsequent encounter: Secondary | ICD-10-CM

## 2023-10-17 DIAGNOSIS — S71101D Unspecified open wound, right thigh, subsequent encounter: Secondary | ICD-10-CM

## 2023-10-17 NOTE — Progress Notes (Signed)
 Post-Op Visit Note   Patient: Caleb Rogers           Date of Birth: April 17, 1951           MRN: [id number] Visit Date: 10/17/2023 PCP: Rodrigo Ran, MD   Assessment & Plan:  Chief Complaint:  Chief Complaint  Patient presents with   Right Hip - Follow-up    I&D of wound 09/18/2023   Visit Diagnoses:  1. Open wound of right hip and thigh, subsequent encounter     Plan: Patient is a pleasant 73 year old gentleman who comes in today 4 weeks status post I&D right superficial hip wound, date of surgery 10/16/2023.  Intraoperative cultures grew out MRSA.  He has a few days left of antibiotics.  He is in no pain.  No fevers or chills or any other systemic symptoms.  Examination of the right hip reveals a fully healed wound without complication.  No evidence of infection or cellulitis.  At this point, he will finish the remaining doses of antibiotics.  He will follow-up with [place] as needed.  Call with concerns or questions. Follow-Up Instructions: Return if symptoms worsen or fail to improve.   Orders:  No orders of the defined types were placed in this encounter.  No orders of the defined types were placed in this encounter.   Imaging: No results found.  PMFS History: Patient Active Problem List   Diagnosis Date Noted   Open wound of right hip and thigh 09/18/2023   Status post total replacement of right hip 06/20/2022   Primary osteoarthritis of right hip 05/18/2022   Loud snoring 05/11/2021   Hypersomnia with sleep apnea 05/11/2021   CPAP (continuous positive airway pressure) dependence 05/11/2021   OSA on CPAP 09/27/2014   OSA (obstructive sleep apnea) 02/24/2014   Gait instability 09/16/2013   Hypertension 09/16/2013   Hyperlipidemia 09/16/2013   TIA (transient ischemic attack) 09/16/2013   Past Medical History:  Diagnosis Date   Gait instability 08/23/2013   GERD (gastroesophageal reflux disease)    Hypertension    Impaired fasting glucose    OSA (obstructive sleep  apnea) 02/24/2014   Pure hypercholesterolemia    Skin cancer of face    not melanoma   Sleep apnea    TIA (transient ischemic attack)    2015    Family History  Problem Relation Age of Onset   Breast cancer Mother        after age 27   Congestive Heart Failure Mother    Coronary artery disease Father    Coronary artery disease Brother 55    Past Surgical History:  Procedure Laterality Date   COLONOSCOPY     DEBRIDEMENT AND CLOSURE WOUND Right 09/18/2023   Procedure: IRRIGATION AND DEBRIDEMEN RIGHT HIP AND CLOSURE WOUND;  Surgeon: Tarry Kos, MD;  Location: [facility];  Service: Orthopedics;  Laterality: Right;   none     TEE WITHOUT CARDIOVERSION N/A 11/11/2014   Procedure: TRANSESOPHAGEAL ECHOCARDIOGRAM (TEE);  Surgeon: Yates Decamp, MD;  Location: [facility] ENDOSCOPY;  Service: Cardiovascular;  Laterality: N/A;  h/p in file drawer   TOTAL HIP ARTHROPLASTY Right 06/20/2022   Procedure: RIGHT TOTAL HIP ARTHROPLASTY ANTERIOR APPROACH;  Surgeon: Tarry Kos, MD;  Location: [facility];  Service: Orthopedics;  Laterality: Right;  3-C   Social History   Occupational History   Occupation: Chief Strategy Officer: Production assistant, radio FOR SELF EMPLOYED  Tobacco Use   Smoking status: Former    Current  packs/day: 0.00    Types: Cigarettes    Start date: 07/24/1963    Quit date: 07/23/1968    Years since quitting: 55.2   Smokeless tobacco: Never   Tobacco comments:    pt quit July 1997  Vaping Use   Vaping status: Never Used  Substance and Sexual Activity   Alcohol use: Yes    Comment: 2-3days a week- 1 beer   Drug use: No   Sexual activity: Yes    Partners: Female

## 2023-11-14 DIAGNOSIS — Z79899 Other long term (current) drug therapy: Secondary | ICD-10-CM | POA: Diagnosis not present

## 2023-11-14 DIAGNOSIS — M8589 Other specified disorders of bone density and structure, multiple sites: Secondary | ICD-10-CM | POA: Diagnosis not present

## 2023-11-14 DIAGNOSIS — I10 Essential (primary) hypertension: Secondary | ICD-10-CM | POA: Diagnosis not present

## 2023-11-14 DIAGNOSIS — R7301 Impaired fasting glucose: Secondary | ICD-10-CM | POA: Diagnosis not present

## 2023-11-14 DIAGNOSIS — Z1212 Encounter for screening for malignant neoplasm of rectum: Secondary | ICD-10-CM | POA: Diagnosis not present

## 2023-11-14 DIAGNOSIS — E785 Hyperlipidemia, unspecified: Secondary | ICD-10-CM | POA: Diagnosis not present

## 2023-11-14 DIAGNOSIS — E291 Testicular hypofunction: Secondary | ICD-10-CM | POA: Diagnosis not present

## 2023-11-14 DIAGNOSIS — D649 Anemia, unspecified: Secondary | ICD-10-CM | POA: Diagnosis not present

## 2023-11-14 DIAGNOSIS — Z125 Encounter for screening for malignant neoplasm of prostate: Secondary | ICD-10-CM | POA: Diagnosis not present

## 2023-11-19 DIAGNOSIS — I77810 Thoracic aortic ectasia: Secondary | ICD-10-CM | POA: Diagnosis not present

## 2023-11-19 DIAGNOSIS — G4733 Obstructive sleep apnea (adult) (pediatric): Secondary | ICD-10-CM | POA: Diagnosis not present

## 2023-11-19 DIAGNOSIS — I2510 Atherosclerotic heart disease of native coronary artery without angina pectoris: Secondary | ICD-10-CM | POA: Diagnosis not present

## 2023-11-19 DIAGNOSIS — Z1339 Encounter for screening examination for other mental health and behavioral disorders: Secondary | ICD-10-CM | POA: Diagnosis not present

## 2023-11-19 DIAGNOSIS — Z77090 Contact with and (suspected) exposure to asbestos: Secondary | ICD-10-CM | POA: Diagnosis not present

## 2023-11-19 DIAGNOSIS — E785 Hyperlipidemia, unspecified: Secondary | ICD-10-CM | POA: Diagnosis not present

## 2023-11-19 DIAGNOSIS — Z96641 Presence of right artificial hip joint: Secondary | ICD-10-CM | POA: Diagnosis not present

## 2023-11-19 DIAGNOSIS — J479 Bronchiectasis, uncomplicated: Secondary | ICD-10-CM | POA: Diagnosis not present

## 2023-11-19 DIAGNOSIS — I10 Essential (primary) hypertension: Secondary | ICD-10-CM | POA: Diagnosis not present

## 2023-11-19 DIAGNOSIS — M8580 Other specified disorders of bone density and structure, unspecified site: Secondary | ICD-10-CM | POA: Diagnosis not present

## 2023-11-19 DIAGNOSIS — Z1331 Encounter for screening for depression: Secondary | ICD-10-CM | POA: Diagnosis not present

## 2023-11-19 DIAGNOSIS — Z8673 Personal history of transient ischemic attack (TIA), and cerebral infarction without residual deficits: Secondary | ICD-10-CM | POA: Diagnosis not present

## 2023-11-19 DIAGNOSIS — Z0000 Encounter for general adult medical examination without abnormal findings: Secondary | ICD-10-CM | POA: Diagnosis not present

## 2023-11-19 DIAGNOSIS — D472 Monoclonal gammopathy: Secondary | ICD-10-CM | POA: Diagnosis not present

## 2023-11-19 DIAGNOSIS — R82998 Other abnormal findings in urine: Secondary | ICD-10-CM | POA: Diagnosis not present

## 2023-12-24 ENCOUNTER — Other Ambulatory Visit: Payer: Self-pay | Admitting: Hematology and Oncology

## 2023-12-24 DIAGNOSIS — D472 Monoclonal gammopathy: Secondary | ICD-10-CM

## 2023-12-25 ENCOUNTER — Inpatient Hospital Stay: Payer: Medicare HMO | Attending: Physician Assistant

## 2023-12-25 DIAGNOSIS — Z803 Family history of malignant neoplasm of breast: Secondary | ICD-10-CM | POA: Insufficient documentation

## 2023-12-25 DIAGNOSIS — Z87891 Personal history of nicotine dependence: Secondary | ICD-10-CM | POA: Insufficient documentation

## 2023-12-25 DIAGNOSIS — D649 Anemia, unspecified: Secondary | ICD-10-CM | POA: Insufficient documentation

## 2023-12-25 DIAGNOSIS — D472 Monoclonal gammopathy: Secondary | ICD-10-CM | POA: Insufficient documentation

## 2023-12-25 LAB — CMP (CANCER CENTER ONLY)
ALT: 22 U/L (ref 0–44)
AST: 19 U/L (ref 15–41)
Albumin: 4 g/dL (ref 3.5–5.0)
Alkaline Phosphatase: 75 U/L (ref 38–126)
Anion gap: 8 (ref 5–15)
BUN: 21 mg/dL (ref 8–23)
CO2: 26 mmol/L (ref 22–32)
Calcium: 9.3 mg/dL (ref 8.9–10.3)
Chloride: 101 mmol/L (ref 98–111)
Creatinine: 0.87 mg/dL (ref 0.61–1.24)
GFR, Estimated: >60 mL/min (ref >60)
Glucose, Bld: 116 mg/dL — ABNORMAL HIGH (ref 70–99)
Potassium: 4.2 mmol/L (ref 3.5–5.1)
Sodium: 135 mmol/L (ref 135–145)
Total Bilirubin: 0.4 mg/dL (ref 0.0–1.2)
Total Protein: 7.5 g/dL (ref 6.5–8.1)

## 2023-12-25 LAB — CBC WITH DIFFERENTIAL (CANCER CENTER ONLY)
Abs Immature Granulocytes: 0.02 10*3/uL (ref 0.00–0.07)
Basophils Absolute: 0 10*3/uL (ref 0.0–0.1)
Basophils Relative: 1 %
Eosinophils Absolute: 0.2 10*3/uL (ref 0.0–0.5)
Eosinophils Relative: 3 %
HCT: 35.2 % — ABNORMAL LOW (ref 39.0–52.0)
Hemoglobin: 12.3 g/dL — ABNORMAL LOW (ref 13.0–17.0)
Immature Granulocytes: 0 %
Lymphocytes Relative: 32 %
Lymphs Abs: 1.9 10*3/uL (ref 0.7–4.0)
MCH: 29.3 pg (ref 26.0–34.0)
MCHC: 34.9 g/dL (ref 30.0–36.0)
MCV: 83.8 fL (ref 80.0–100.0)
Monocytes Absolute: 0.7 10*3/uL (ref 0.1–1.0)
Monocytes Relative: 11 %
Neutro Abs: 3.2 10*3/uL (ref 1.7–7.7)
Neutrophils Relative %: 53 %
Platelet Count: 376 10*3/uL (ref 150–400)
RBC: 4.2 MIL/uL — ABNORMAL LOW (ref 4.22–5.81)
RDW: 13.1 % (ref 11.5–15.5)
WBC Count: 6.1 10*3/uL (ref 4.0–10.5)
nRBC: 0 % (ref 0.0–0.2)

## 2023-12-25 LAB — LACTATE DEHYDROGENASE: LDH: 122 U/L (ref 98–192)

## 2023-12-26 LAB — KAPPA/LAMBDA LIGHT CHAINS
Kappa free light chain: 21.7 mg/L — ABNORMAL HIGH (ref 3.3–19.4)
Kappa, lambda light chain ratio: 0.15 — ABNORMAL LOW (ref 0.26–1.65)
Lambda free light chains: 145.9 mg/L — ABNORMAL HIGH (ref 5.7–26.3)

## 2023-12-27 LAB — MULTIPLE MYELOMA PANEL, SERUM
Albumin SerPl Elph-Mcnc: 3.4 g/dL (ref 2.9–4.4)
Albumin/Glob SerPl: 1.1 (ref 0.7–1.7)
Alpha 1: 0.3 g/dL (ref 0.0–0.4)
Alpha2 Glob SerPl Elph-Mcnc: 0.9 g/dL (ref 0.4–1.0)
B-Globulin SerPl Elph-Mcnc: 1.2 g/dL (ref 0.7–1.3)
Gamma Glob SerPl Elph-Mcnc: 0.9 g/dL (ref 0.4–1.8)
Globulin, Total: 3.4 g/dL (ref 2.2–3.9)
IgA: 334 mg/dL (ref 61–437)
IgG (Immunoglobin G), Serum: 1169 mg/dL (ref 603–1613)
IgM (Immunoglobulin M), Srm: 55 mg/dL (ref 15–143)
Total Protein ELP: 6.8 g/dL (ref 6.0–8.5)

## 2024-01-01 ENCOUNTER — Inpatient Hospital Stay: Payer: Medicare HMO | Admitting: Hematology and Oncology

## 2024-01-01 VITALS — BP 147/92 | HR 50 | Temp 97.3°F | Resp 15 | Wt 203.8 lb

## 2024-01-01 DIAGNOSIS — R5383 Other fatigue: Secondary | ICD-10-CM

## 2024-01-01 DIAGNOSIS — Z803 Family history of malignant neoplasm of breast: Secondary | ICD-10-CM | POA: Diagnosis not present

## 2024-01-01 DIAGNOSIS — D472 Monoclonal gammopathy: Secondary | ICD-10-CM | POA: Diagnosis not present

## 2024-01-01 DIAGNOSIS — D649 Anemia, unspecified: Secondary | ICD-10-CM

## 2024-01-01 DIAGNOSIS — Z87891 Personal history of nicotine dependence: Secondary | ICD-10-CM | POA: Diagnosis not present

## 2024-01-01 NOTE — Progress Notes (Signed)
 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  11/07/2022: SPEP did not detect monoclonal protein but polyclonal increase. Urine protein electrophoresis did detect M-spike measuring 32.8% with IFE that showed bence jones proteins; lambda type. Iron saturation 13% (L), Ferritin 88, TIBC 282, Iron 38, vitamin B12 381.  11/28/2022: Establish care with [facility]. Kappa 29.3, Lambda 127.9, Ratio 0.23. UPEP showed Bence Jones Protein.  12/18/2022: bone marrow biopsy showed normocellular bone marrow (30%) with mildly increased plasma cells (9% by manual aspirate differential, and up to 10% by CD138  immunohistochemical analysis), and lambda predominance by kappa/lambda  in situ hybridization.   Interval History:  Caleb Rogers 73 y.o. male with medical history significant for free lambda light chain MGUS who presents for a follow up visit. The patient's last visit was on 07/03/2023. In the interim since the last visit he has had no major changes in his health.    On exam today Mr. States reports he has been well overall in interim since her last visit.  He reports that he is getting more tired and thinks may be because he is getting older.  He reports that he is doing his best to try to work out more though he has gained weight.  He reports that he does his best to try to lift weights approximately 2 times a week and also does was called body combat.  He reports no bone or back pain.  He denies any fevers, chills, sweats, nausea, vomiting or diarrhea.  Full 10 point ROS is otherwise negative.  Both were discussion focused on the  results of his labs and steps moving forward.  Details of this conversation are noted below.   MEDICAL HISTORY:  Past Medical History:  Diagnosis Date   Gait instability 08/23/2013   GERD (gastroesophageal reflux disease)    Hypertension    Impaired fasting glucose    OSA (obstructive sleep apnea) 02/24/2014   Pure hypercholesterolemia    Skin cancer of face    not melanoma   Sleep apnea    TIA (transient ischemic attack)    2015    SURGICAL HISTORY: Past Surgical History:  Procedure Laterality Date   COLONOSCOPY     DEBRIDEMENT AND CLOSURE WOUND Right 09/18/2023   Procedure: IRRIGATION AND DEBRIDEMEN RIGHT HIP AND CLOSURE WOUND;  Surgeon: Wes Hamman, MD;  Location: [facility];  Service: Orthopedics;  Laterality: Right;   none     TEE WITHOUT CARDIOVERSION N/A 11/11/2014   Procedure: TRANSESOPHAGEAL ECHOCARDIOGRAM (TEE);  Surgeon: Knox Perl, MD;  Location: [facility] ENDOSCOPY;  Service: Cardiovascular;  Laterality: N/A;  h/p in file drawer   TOTAL HIP ARTHROPLASTY Right 06/20/2022   Procedure: RIGHT TOTAL HIP ARTHROPLASTY ANTERIOR APPROACH;  Surgeon: Wes Hamman, MD;  Location: [facility] OR;  Service: Orthopedics;  Laterality: Right;  3-C    SOCIAL HISTORY: Social History   Socioeconomic History   Marital status: Married    Spouse name: Antony Baumgartner   Number of children: 2   Years of education: college   Highest education level: Not on file  Occupational History   Occupation: Chief Strategy Officer: Production assistant, radio FOR SELF EMPLOYED  Tobacco Use   Smoking status: Former    Current packs/day: 0.00    Types: Cigarettes    Start date: 07/24/1963    Quit date: 07/23/1968    Years since quitting: 55.4   Smokeless tobacco: Never   Tobacco comments:    pt quit July 1997  Vaping Use   Vaping status: Never Used  Substance and Sexual Activity   Alcohol  use: Yes    Comment: 2-3days a week- 1 beer   Drug use: No   Sexual activity: Yes    Partners: Female  Other Topics Concern    Not on file  Social History Narrative   Patient is married Antony Baumgartner) and lives at home with his wife.   Patient has two adult children.   Patient is working full-time.   Patient has a college education.   Patient is right- handed.   Patient drinks two cups of coffee daily.   Social Drivers of Corporate investment banker Strain: Not on file  Food Insecurity: No Food Insecurity (06/20/2022)   Hunger Vital Sign    Worried About Running Out of Food in the Last Year: Never true    Ran Out of Food in the Last Year: Never true  Transportation Needs: No Transportation Needs (06/20/2022)   PRAPARE - Administrator, Civil Service (Medical): No    Lack of Transportation (Non-Medical): No  Physical Activity: Not on file  Stress: Not on file  Social Connections: Not on file  Intimate Partner Violence: Not At Risk (06/20/2022)   Humiliation, Afraid, Rape, and Kick questionnaire    Fear of Current or Ex-Partner: No    Emotionally Abused: No    Physically Abused: No    Sexually Abused: No    FAMILY HISTORY: Family History  Problem Relation Age of Onset   Breast cancer Mother        after age 46   Congestive Heart Failure Mother    Coronary artery disease Father    Coronary artery disease Brother 56    ALLERGIES:  is allergic to atorvastatin  and other.  MEDICATIONS:  Current Outpatient Medications  Medication Sig Dispense Refill   aspirin  EC 81 MG tablet Take 81 mg by mouth daily. Swallow whole.     ezetimibe  (ZETIA ) 10 MG tablet Take 1 tablet (10 mg total) by mouth daily. 30 tablet 0   HYDROcodone -acetaminophen  (NORCO) 5-325 MG tablet Take 1 tablet by mouth at bedtime as needed. 10 tablet 0   MAGNESIUM  PO Take 1 tablet by mouth daily.     omeprazole (PRILOSEC) 40 MG capsule Take 40 mg by mouth daily.     POTASSIUM PO Take 1 tablet by mouth daily.     REPATHA SURECLICK 140 MG/ML SOAJ Inject 140 mg into the skin every 14 (fourteen) days.     valsartan -hydrochlorothiazide   (DIOVAN -HCT) 160-25 MG tablet Take 1 tablet by mouth daily. 15 tablet 0   No current facility-administered medications for this visit.    REVIEW OF SYSTEMS:   Constitutional: ( - ) fevers, ( - )  chills , ( - ) night sweats Eyes: ( - ) blurriness of vision, ( - ) double vision, ( - ) watery eyes Ears, nose, mouth, throat, and face: ( - ) mucositis, ( - ) sore throat Respiratory: ( - ) cough, ( - ) dyspnea, ( - ) wheezes Cardiovascular: ( - ) palpitation, ( - ) chest discomfort, ( - ) lower extremity swelling Gastrointestinal:  ( - )  nausea, ( - ) heartburn, ( - ) change in bowel habits Skin: ( - ) abnormal skin rashes Lymphatics: ( - ) new lymphadenopathy, ( - ) easy bruising Neurological: ( - ) numbness, ( - ) tingling, ( - ) new weaknesses Behavioral/Psych: ( - ) mood change, ( - ) new changes  All other systems were reviewed with the patient and are negative.  PHYSICAL EXAMINATION: Vitals:   01/01/24 1138  BP: (!) 147/92  Pulse: (!) 50  Resp: 15  Temp: (!) 97.3 F (36.3 C)  SpO2: 97%     Filed Weights   01/01/24 1138  Weight: 203 lb 12.8 oz (92.4 kg)      GENERAL: Well-appearing elderly Caucasian male, alert, no distress and comfortable SKIN: skin color, texture, turgor are normal, no rashes or significant lesions EYES: conjunctiva are pink and non-injected, sclera clear LUNGS: clear to auscultation and percussion with normal breathing effort HEART: regular rate & rhythm and no murmurs and no lower extremity edema Musculoskeletal: no cyanosis of digits and no clubbing  PSYCH: alert & oriented x 3, fluent speech NEURO: no focal motor/sensory deficits  LABORATORY DATA:  I have reviewed the data as listed    Latest Ref Rng & Units 12/25/2023   11:20 AM 10/02/2023   11:44 AM 06/25/2023   12:00 PM  CBC  WBC 4.0 - 10.5 K/uL 6.1  6.9  6.9   Hemoglobin 13.0 - 17.0 g/dL 78.2  95.6  21.3   Hematocrit 39.0 - 52.0 % 35.2  37.4  38.4   Platelets 150 - 400 K/uL 376  332  365         Latest Ref Rng & Units 12/25/2023   11:20 AM 10/02/2023   11:44 AM 09/16/2023    1:30 PM  CMP  Glucose 70 - 99 mg/dL 086  578  93   BUN 8 - 23 mg/dL 21  24  18    Creatinine 0.61 - 1.24 mg/dL 4.69  6.29  5.28   Sodium 135 - 145 mmol/L 135  137  141   Potassium 3.5 - 5.1 mmol/L 4.2  3.9  4.3   Chloride 98 - 111 mmol/L 101  103  105   CO2 22 - 32 mmol/L 26  29  23    Calcium  8.9 - 10.3 mg/dL 9.3  9.1  9.3   Total Protein 6.5 - 8.1 g/dL 7.5  7.2    Total Bilirubin 0.0 - 1.2 mg/dL 0.4  0.5    Alkaline Phos 38 - 126 U/L 75  79    AST 15 - 41 U/L 19  21    ALT 0 - 44 U/L 22  20      Lab Results  Component Value Date   MPROTEIN Not Observed 12/25/2023   MPROTEIN Not Observed 10/02/2023   MPROTEIN Not Observed 06/25/2023   Lab Results  Component Value Date   KPAFRELGTCHN 21.7 (H) 12/25/2023   KPAFRELGTCHN 21.2 (H) 10/02/2023   KPAFRELGTCHN 23.6 (H) 06/25/2023   LAMBDASER 145.9 (H) 12/25/2023   LAMBDASER 133.4 (H) 10/02/2023   LAMBDASER 140.2 (H) 06/25/2023   KAPLAMBRATIO 0.15 (L) 12/25/2023   KAPLAMBRATIO 0.16 (L) 10/02/2023   KAPLAMBRATIO 0.17 (L) 06/25/2023    RADIOGRAPHIC STUDIES: No results found.  ASSESSMENT & PLAN Caleb Rogers 73 y.o. male with medical history significant for free lambda light chain MGUS who presents for a follow up visit.  #Lambda light chain monoclonal gammopathy: --Outside UPEP from 11/07/2022 detected M-spike measuring 32.8% with  IFE that showed bence jones proteins; lambda type.SPEP did not detect M protein --Labs from 11/28/2022 were reviewed with the patient. SPEP/IFE did not detect a monoclonal protein. Serum free light chains showed elevated lambda light chain of 127.9, Kappa light chain of 29.3 and ratio of 0.23. CBC showed persistent anemia with Hgb 11.5, Plt 407K. CMP did not show renal dysfunction or hypercalcemia.  --24 hour UPEP from 12/03/2022 did detect monoclonal protein measuring 41 mg/24 hr. IFE showed bence jones proteins, lambda  type --Bone met survey from 11/30/2022 did not show any lytic lesions or pathologic fractures -- bone marrow biopsy showed 9-10% plasma cells.  PLAN: --labs today show white blood cell 6.1, Hgb 12.3, MCV 83.8, Plt 376  --Serum free light chain levels relatively stable (Kappa 145.9 with ratio 0.15).  M protein undetectable in the serum.  Will make sure we check UPEP yearly, due at next visit. --recommend continued observation at this time.  --recommend RTC in 6 months time with repeat MGUS labs  No orders of the defined types were placed in this encounter.   All questions were answered. The patient knows to call the clinic with any problems, questions or concerns.  A total of more than 25 minutes were spent on this encounter with face-to-face time and non-face-to-face time, including preparing to see the patient, ordering tests and/or medications, counseling the patient and coordination of care as outlined above.   Rogerio Clay, MD Department of Hematology/Oncology [facility] Phone: [phone] Pager: [phone] Email: [email or web address]  01/01/2024 1:00 PM

## 2024-01-16 ENCOUNTER — Ambulatory Visit: Attending: Cardiology | Admitting: Cardiology

## 2024-01-16 ENCOUNTER — Encounter: Payer: Self-pay | Admitting: Cardiology

## 2024-01-16 ENCOUNTER — Encounter: Payer: Self-pay | Admitting: *Deleted

## 2024-01-16 ENCOUNTER — Other Ambulatory Visit: Payer: Self-pay | Admitting: Cardiology

## 2024-01-16 VITALS — BP 147/71 | HR 45 | Resp 16 | Ht 70.0 in | Wt 201.6 lb

## 2024-01-16 DIAGNOSIS — R0609 Other forms of dyspnea: Secondary | ICD-10-CM

## 2024-01-16 DIAGNOSIS — I10 Essential (primary) hypertension: Secondary | ICD-10-CM | POA: Diagnosis not present

## 2024-01-16 DIAGNOSIS — G720 Drug-induced myopathy: Secondary | ICD-10-CM | POA: Diagnosis not present

## 2024-01-16 DIAGNOSIS — I2584 Coronary atherosclerosis due to calcified coronary lesion: Secondary | ICD-10-CM

## 2024-01-16 DIAGNOSIS — I2510 Atherosclerotic heart disease of native coronary artery without angina pectoris: Secondary | ICD-10-CM

## 2024-01-16 DIAGNOSIS — T466X5D Adverse effect of antihyperlipidemic and antiarteriosclerotic drugs, subsequent encounter: Secondary | ICD-10-CM | POA: Diagnosis not present

## 2024-01-16 DIAGNOSIS — J61 Pneumoconiosis due to asbestos and other mineral fibers: Secondary | ICD-10-CM

## 2024-01-16 MED ORDER — AMLODIPINE BESYLATE 5 MG PO TABS: 5.0000 mg | ORAL_TABLET | Freq: Every day | ORAL | 1 refills | Status: DC

## 2024-01-16 NOTE — Progress Notes (Signed)
 Cardiology Office Note:  .   Date:  01/19/2024  ID:  Caleb Rogers, DOB [birth year], MRN [id number] PCP: Shayne Anes, MD  [facility] Providers Cardiologist:  Gordy Bergamo, MD   History of Present Illness: .   Caleb Rogers is a 73 y.o.  Caucasian male with a past medical history significant for hypertension, hyperlipidemia, OSA on CPAP, elevated Agatston score of 1897 placing  him at 94th percentile for subjects of the same age, gender, ethnicity, 2 suspected TIAs, the first in February 2015 and the second one February 2016.  MRI at that time revealed chronic small vessel ischemia and small chronic right cerebellar lacunar infarct without any acute infarct in 2015 and repeat MRI revealed no new changes.  Carotid stenosis of 40% left ICA. Remote brief former tobacco use in 1970. Had PFO by TEE as well as aortic atherosclerosis, did not have closure as stroke is felt to be atherosclerotic.    I had last seen him in November 2023 for preoperative cardiac risk stratification for right hip arthroplasty which he underwent without periprocedural cardiac complications however has developed wound dehiscence and abscess needing irrigation and debridement in February 2025.SABRA  He has had a intermediate risk nuclear stress test on 06/30/2018 revealing no evidence of ischemia however mildly reduced LVEF at 45%.  Echocardiogram on 07/05/2018 and also 07/31/2022 revealing normal LVEF without significant valvular abnormality.     Patient referred back to me by Dr. Anes Shayne for evaluation of worsening dyspnea on exertion.  Of note patient has also been diagnosed with asbestos lung disease during CT scan of the chest in 2019 and repeat scan in March 23 again revealing stable findings and aortic and coronary atherosclerosis.  Discussed the use of AI scribe software for clinical note transcription with the patient, who gave verbal consent to proceed.  History of Present Illness Caleb Rogers is a 73 year old  male with coronary artery disease and asbestos lung disease who presents with worsening shortness of breath and fatigue. He was referred by Dr. Anes Cedar for evaluation of his symptoms.  Over the past three months, he experiences worsening shortness of breath and fatigue with a noticeable decline in energy levels. He does not experience chest discomfort or tightness during physical activities such as daily workout classes, which include weight lifting and cardio, and regular walks. He has coronary artery disease with a coronary calcium  score in the 94th percentile noted in 2019. A stress test was performed at that time. He is on Repatha for cholesterol management and valsartan  for blood pressure control. Blood pressure readings are occasionally in the 140s, though typically in the 130s.  There is a strong family history of cardiovascular disease, with both his father and brother affected. He remains physically active, participating in daily workout classes and regular walks. He has an intolerance to statins due to severe aches and pains, which was addressed by switching to Repatha.   Labs   Lab Results  Component Value Date   CHOL 213 (H) 09/17/2013   HDL 61 09/17/2013   LDLCALC 127 (H) 09/17/2013   TRIG 127 09/17/2013   CHOLHDL 3.5 09/17/2013   Lab Results  Component Value Date   NA 135 12/25/2023   K 4.2 12/25/2023   CO2 26 12/25/2023   GLUCOSE 116 (H) 12/25/2023   BUN 21 12/25/2023   CREATININE 0.87 12/25/2023   CALCIUM  9.3 12/25/2023   GFRNONAA >60 12/25/2023      Latest Ref Rng & Units 12/25/2023  11:20 AM 10/02/2023   11:44 AM 09/16/2023    1:30 PM  BMP  Glucose 70 - 99 mg/dL 883  896  93   BUN 8 - 23 mg/dL 21  24  18    Creatinine 0.61 - 1.24 mg/dL 9.12  9.14  9.15   Sodium 135 - 145 mmol/L 135  137  141   Potassium 3.5 - 5.1 mmol/L 4.2  3.9  4.3   Chloride 98 - 111 mmol/L 101  103  105   CO2 22 - 32 mmol/L 26  29  23    Calcium  8.9 - 10.3 mg/dL 9.3  9.1  9.3        Latest Ref Rng & Units 12/25/2023   11:20 AM 10/02/2023   11:44 AM 06/25/2023   12:00 PM  CBC  WBC 4.0 - 10.5 K/uL 6.1  6.9  6.9   Hemoglobin 13.0 - 17.0 g/dL 87.6  87.1  86.7   Hematocrit 39.0 - 52.0 % 35.2  37.4  38.4   Platelets 150 - 400 K/uL 376  332  365    Lab Results  Component Value Date   HGBA1C 5.8 (H) 09/16/2013    Lab Results  Component Value Date   TSH 1.354 12/25/2022    External Labs:  PCP faxed labs 11/14/2023:  BUN 21, creatinine 0.9, EGFR 82 mL, potassium 4.3, LFTs normal.  CBC normal.  Total cholesterol 106, triglycerides 91, HDL 47, LDL 41.  ROS  Review of Systems  Cardiovascular:  Positive for dyspnea on exertion. Negative for chest pain and leg swelling.    Physical Exam:   VS:  BP (!) 147/71 (BP Location: Left Arm, Patient Position: Sitting, Cuff Size: Large)   Pulse (!) 45   Resp 16   Ht 5' 10 (1.778 m)   Wt 201 lb 9.6 oz (91.4 kg)   SpO2 97%   BMI 28.93 kg/m    Wt Readings from Last 3 Encounters:  01/16/24 201 lb 9.6 oz (91.4 kg)  01/01/24 203 lb 12.8 oz (92.4 kg)  09/19/23 201 lb 12.8 oz (91.5 kg)    Physical Exam Neck:     Vascular: No JVD.   Cardiovascular:     Rate and Rhythm: Normal rate and regular rhythm.     Pulses: Intact distal pulses.     Heart sounds: S1 normal and S2 normal. Murmur heard.     Early systolic murmur is present with a grade of 2/6 at the upper right sternal border.     No gallop.  Pulmonary:     Effort: Pulmonary effort is normal.     Breath sounds: Normal breath sounds.  Abdominal:     General: Bowel sounds are normal.     Palpations: Abdomen is soft.   Musculoskeletal:     Right lower leg: No edema.     Left lower leg: No edema.    Studies Reviewed: .    Echocardiogram 07/31/2022: Normal LV systolic function with visual EF 60-65%. Left ventricle cavity is normal in size. Normal left ventricular wall thickness. Normal global wall motion. Normal diastolic filling pattern, normal LAP. Left atrial  cavity is normal in size. The interatrial Septum is thin and mobile but appears to be intact by 2D and CF Doppler interrogation. Trace aortic regurgitation. Trace mitral regurgitation. Mild tricuspid regurgitation. No evidence of pulmonary hypertension. Compared to 07/05/2018 severity of AR, MR, and TR have improved otherwise no significant change.  CT chest without contrast 09/20/2021: Cardiovascular: Limited unenhanced  imaging of the heart and great vessels demonstrates no pericardial effusion. Extensive atherosclerosis throughout the coronary vasculature. Normal caliber of the thoracic aorta. Mild atherosclerosis of the aortic arch. Evaluation of the vascular lumen is limited without IV contrast.  Lungs/Pleura: No acute airspace disease, effusion, or pneumothorax. Central airways are widely patent. Mild bibasilar bronchiectasis unchanged. Stable bilateral calcified pleural plaques consistent with prior asbestos exposure. No underlying scarring or fibrosis.  EKG:    EKG Interpretation Date/Time:  Thursday January 16 2024 10:25:15 EDT Ventricular Rate:  50 PR Interval:  140 QRS Duration:  102 QT Interval:  458 QTC Calculation: 417 R Axis:   -21  Text Interpretation: EKG 01/16/2024: Sinus bradycardia at rate of 50 bpm, left anterior fascicular block.  Incomplete right bundle branch block.  Compared to 09/16/2023, no significant change. Confirmed by Wilson Dusenbery, Jagadeesh [phone]) on 01/16/2024 10:32:48 AM   EKG 06/12/2022: Marked sinus bradycardia at rate of 45 bpm.  Left axis deviation.  Incomplete right bundle branch block.    Medications ordered    Meds ordered this encounter  Medications   amLODipine  (NORVASC ) 5 MG tablet    Sig: Take 1 tablet (5 mg total) by mouth daily.    Dispense:  90 tablet    Refill:  1    Refills to Oneil Neth, MD     ASSESSMENT AND PLAN: .      ICD-10-CM   1. Coronary artery disease due to calcified coronary lesion  I25.10 EKG 12-Lead   I25.84 amLODipine   (NORVASC ) 5 MG tablet    Cardiac Stress Test: Informed Consent Details: Physician/Practitioner Attestation; Transcribe to consent form and obtain patient signature    MYOCARDIAL PERFUSION IMAGING    2. Dyspnea on exertion  R06.09 Cardiac Stress Test: Informed Consent Details: Physician/Practitioner Attestation; Transcribe to consent form and obtain patient signature    MYOCARDIAL PERFUSION IMAGING    3. Primary hypertension  I10 amLODipine  (NORVASC ) 5 MG tablet    4. Asbestos-induced pleural fibrosis (HCC)  J61     5. Statin myopathy  G72.0    [id number]      Assessment & Plan Coronary artery disease with atherosclerosis Coronary artery disease with extensive atherosclerosis noted on CT scan. Current symptoms include shortness of breath and fatigue, potentially related to coronary artery disease. He is on Repatha and valsartan  for cholesterol and blood pressure management, respectively. - Order stress test to evaluate for significant coronary artery blockage - Continue Repatha for cholesterol management - Add amlodipine  5 mg for blood pressure control - Monitor blood pressure, consider increasing valsartan  HCT if blood pressure remains above 130/80  Hypertension Hypertension with recent blood pressure reading of 147/70. Home readings typically in the 130s, occasionally in the 140s. Current management includes valsartan  HCT. - Add amlodipine  5 mg for additional blood pressure control - Monitor blood pressure, consider increasing valsartan  HCT if blood pressure remains above 130/80  Statin intolerance Intolerance to statins due to severe muscle aches and pains. He is currently on Repatha and Zetia  for cholesterol management, with excellent control of lipid levels. 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 The infection is complicated by the presence  of a foreign body (hip hardware), which lacks blood supply, making antibiotic treatment less effective. He is considering further surgical intervention and antibiotics to manage the infection. - Continue current management with antibiotics and surgical consultation as needed - He has had many  interventions in the past.  Although I have recommended a stress test, he can be taken up for the surgery which is scheduled for this Monday on 01/20/2024 with low risk.  Stress is being performed to exclude significant CAD that is making his exercise capacity low and fatigue.  My suspicion is that chronic underlying inflammation and infection is leading to his present symptomatology.  Asbestos-related pleural fibrosis & Bronchiectasis Asbestos-related pleural fibrosis with associated pleural scarring and bronchiectasis. These conditions may contribute to his shortness of breath and fatigue. Bronchiectasis noted on CT scans from 2019 and 2023. This condition may contribute to respiratory symptoms, including shortness of breath.  Monoclonal gammopathy of undetermined significance (MGUS) MGUS diagnosed by Dr. Federico. The condition is being monitored, and it may contribute to fatigue.  Obstructive sleep apnea Obstructive sleep apnea managed with CPAP, which he uses 100% of the time. The condition is well-controlled, and his wife reports improved sleep quality. - Continue CPAP use   Will send a letter to [name], MD to proceed with planned surgery on 01/20/2024.   Signed,  Gordy Bergamo, MD, [facility] 01/19/2024, 12:04 PM [facility] [address] [place], [place] [address] Phone: [phone]. Fax:  [phone]

## 2024-01-16 NOTE — Patient Instructions (Signed)
 Medication Instructions:  Your physician has recommended you make the following change in your medication: Start amlodipine 5 mg by mouth daily   *If you need a refill on your cardiac medications before your next appointment, please call your pharmacy*  Lab Work: none If you have labs (blood work) drawn today and your tests are completely normal, you will receive your results only by: MyChart Message (if you have MyChart) OR A paper copy in the mail If you have any lab test that is abnormal or we need to change your treatment, we will call you to review the results.  Testing/Procedures: Your physician has requested that you have an exercise stress myoview . For further information please visit [email or web address] Please follow instruction sheet, as given.   Follow-Up: At [facility], you and your health needs are our priority.  As part of our continuing mission to provide you with exceptional heart care, our providers are all part of one team.  This team includes your primary Cardiologist (physician) and Advanced Practice Providers or APPs (Physician Assistants and Nurse Practitioners) who all work together to provide you with the care you need, when you need it.  Your next appointment:   As needed  Provider:   Gordy Bergamo, MD    We recommend signing up for the patient portal called MyChart.  Sign up information is provided on this After Visit Summary.  MyChart is used to connect with patients for Virtual Visits (Telemedicine).  Patients are able to view lab/test results, encounter notes, upcoming appointments, etc.  Non-urgent messages can be sent to your provider as well.   To learn more about what you can do with MyChart, go to [email or web address].   Other Instructions

## 2024-01-17 ENCOUNTER — Encounter (HOSPITAL_COMMUNITY): Payer: Self-pay | Admitting: Orthopaedic Surgery

## 2024-01-17 ENCOUNTER — Ambulatory Visit: Admitting: Orthopaedic Surgery

## 2024-01-17 ENCOUNTER — Encounter (HOSPITAL_COMMUNITY): Payer: Self-pay | Admitting: *Deleted

## 2024-01-17 ENCOUNTER — Other Ambulatory Visit: Payer: Self-pay

## 2024-01-17 ENCOUNTER — Other Ambulatory Visit (INDEPENDENT_AMBULATORY_CARE_PROVIDER_SITE_OTHER): Payer: Self-pay

## 2024-01-17 ENCOUNTER — Other Ambulatory Visit: Payer: Self-pay | Admitting: Physician Assistant

## 2024-01-17 DIAGNOSIS — M1611 Unilateral primary osteoarthritis, right hip: Secondary | ICD-10-CM

## 2024-01-17 DIAGNOSIS — L02415 Cutaneous abscess of right lower limb: Secondary | ICD-10-CM | POA: Diagnosis not present

## 2024-01-17 MED ORDER — CHLORHEXIDINE GLUCONATE 4 % EX SOLN: CUTANEOUS | 1 refills | Status: AC

## 2024-01-17 MED ORDER — MUPIROCIN 2 % EX OINT: TOPICAL_OINTMENT | CUTANEOUS | 0 refills | Status: AC

## 2024-01-17 MED ORDER — DOXYCYCLINE HYCLATE 100 MG PO TABS: 100.0000 mg | ORAL_TABLET | Freq: Two times a day (BID) | ORAL | 0 refills | Status: DC

## 2024-01-17 MED ORDER — TRANEXAMIC ACID 1000 MG/10ML IV SOLN
2000.0000 mg | INTRAVENOUS | Status: DC
  Filled 2024-01-17: qty 20

## 2024-01-17 NOTE — Progress Notes (Signed)
 PCP - Oneil Neth, MD Cardiologist - Gordy Bergamo, MD  EKG - 01/16/24 Stress Test - Scheduled ECHO - 07/31/22  CPAP - Wears every night  Aspirin  Instructions: Stop [place]  Anesthesia review: Y  Patient verbally denies any shortness of breath, fever, cough and chest pain during phone call   -------------  SDW INSTRUCTIONS given:  Your procedure is scheduled on Monday, June 30th.  Report to [facility] Main Entrance A at 1110 A.M., and check in at the Admitting office.  Call this number if you have problems the morning of surgery:  [phone]   Remember:  Do not eat after midnight the night before your surgery  You may drink clear liquids until 1030 the morning of your surgery.   Clear liquids allowed are: Water, Non-Citrus Juices (without pulp), Carbonated Beverages, Clear Tea, Black Coffee Only, and Gatorade    Take these medicines the morning of surgery with A SIP OF WATER  amLODipine  (NORVASC )  chlorhexidine  (HIBICLENS )  doxycycline  (VIBRA -TABS)  ezetimibe  (ZETIA )  mupirocin  ointment (BACTROBAN )  omeprazole (PRILOSEC OTC)  acetaminophen  (TYLENOL )-if needed   As of today, STOP taking any Aspirin  (unless otherwise instructed by your surgeon) Aleve, Naproxen, Ibuprofen, Motrin, Advil, Goody's, BC's, all herbal medications, fish oil, and all vitamins.                      Do not wear jewelry, make up, or nail polish            Do not wear lotions, powders, perfumes/colognes, or deodorant.            Do not shave 48 hours prior to surgery.  Men may shave face and neck.            Do not bring valuables to the hospital.            [facility] is not responsible for any belongings or valuables.  Do NOT Smoke (Tobacco/Vaping) 24 hours prior to your procedure If you use a CPAP at night, you may bring all equipment for your overnight stay.   Contacts, glasses, dentures or bridgework may not be worn into surgery.      For patients admitted to the hospital, discharge time  will be determined by your treatment team.   Patients discharged the day of surgery will not be allowed to drive home, and someone needs to stay with them for 24 hours.    Special instructions:   [facility]- Preparing For Surgery  Before surgery, you can play an important role. Because skin is not sterile, your skin needs to be as free of germs as possible. You can reduce the number of germs on your skin by washing with CHG (chlorahexidine gluconate) Soap before surgery.  CHG is an antiseptic cleaner which kills germs and bonds with the skin to continue killing germs even after washing.    Oral Hygiene is also important to reduce your risk of infection.  Remember - BRUSH YOUR TEETH THE MORNING OF SURGERY WITH YOUR REGULAR TOOTHPASTE  Please do not use if you have an allergy to CHG or antibacterial soaps. If your skin becomes reddened/irritated stop using the CHG.  Do not shave (including legs and underarms) for at least 48 hours prior to first CHG shower. It is OK to shave your face.  Please follow these instructions carefully.   Shower the NIGHT BEFORE SURGERY and the MORNING OF SURGERY with DIAL Soap.   Pat yourself dry with a CLEAN TOWEL.  Wear CLEAN PAJAMAS to bed the night before surgery  Place CLEAN SHEETS on your bed the night of your first shower and DO NOT SLEEP WITH PETS.   Day of Surgery: Please shower morning of surgery  Wear Clean/Comfortable clothing the morning of surgery Do not apply any deodorants/lotions.   Remember to brush your teeth WITH YOUR REGULAR TOOTHPASTE.   Questions were answered. Patient verbalized understanding of instructions.

## 2024-01-17 NOTE — Progress Notes (Signed)
 Office Visit Note   Patient: Caleb Rogers           Date of Birth: 1951/06/30           MRN: [id number] Visit Date: 01/17/2024              Requested by: Shayne Anes, MD [address] [place],  [place] [address] PCP: Shayne Anes, MD   Assessment & Plan: Visit Diagnoses:  1. Primary osteoarthritis of right hip   2. Abscess of right hip     Plan: Patient is a year and a half from right total hip arthroplasty.  In the last few months he has been dealing with this abscess.  I need to obtain a stat CT scan to look for deeper infection.  Will need another washout in the operating room in these next couple days.  Will send in prescription for doxycycline , Hibiclens  wash, mupirocin .  Was positive for staph but negative for MRSA on her prior PCR screen.  Follow-Up Instructions: No follow-ups on file.   Orders:  Orders Placed This Encounter  Procedures   XR HIP UNILAT W OR W/O PELVIS 2-3 VIEWS RIGHT   CT HIP RIGHT W WO CONTRAST   Meds ordered this encounter  Medications   doxycycline  (VIBRA -TABS) 100 MG tablet    Sig: Take 1 tablet (100 mg total) by mouth 2 (two) times daily.    Dispense:  20 tablet    Refill:  0   chlorhexidine  (HIBICLENS ) 4 % external liquid    Sig: Apply 15 mLs topically as directed for 30 doses.  Use as directed daily for 5 days every other week for 6 weeks    Dispense:  946 mL    Refill:  1   mupirocin  ointment (BACTROBAN ) 2 %    Sig: Place one application into the nose twice daily for 60 doses.  Use as directed 2 times daily for 5 days every other week for 6 weeks.    Dispense:  22 g    Refill:  0   Subjective: Chief Complaint  Patient presents with   Right Hip - Pain    HPI Caleb Rogers is a 73 year old gentleman who underwent a right total hip arthroplasty in November 2023 and had an uneventful postoperative course.  About 4 months ago he spontaneously developed an abscess around the surgical scar that was found to be MRSA.  He underwent formal I&D in the  operating room and was placed on 4 weeks of antibiotics.  Intraoperative findings showed a superficial abscess limited to the subcutaneous space.  There is no evidence of subfascial involvement.  He comes in today for redevelopment of a boil that started about a week ago.  Denies any constitutional symptoms.  He reports some redness and tenderness to touch.  Review of Systems  Constitutional: Negative.   HENT: Negative.    Eyes: Negative.   Respiratory: Negative.    Cardiovascular: Negative.   Gastrointestinal: Negative.   Endocrine: Negative.   Genitourinary: Negative.   Skin: Negative.   Allergic/Immunologic: Negative.   Neurological: Negative.   Hematological: Negative.   Psychiatric/Behavioral: Negative.    All other systems reviewed and are negative.  Objective: Vital Signs: There were no vitals taken for this visit.  Physical Exam Vitals and nursing note reviewed.  Constitutional:      Appearance: He is well-developed.  HENT:     Head: Normocephalic and atraumatic.   Eyes:     Pupils: Pupils are equal, round, and reactive  to light.   Pulmonary:     Effort: Pulmonary effort is normal.  Abdominal:     Palpations: Abdomen is soft.   Musculoskeletal:        General: Normal range of motion.     Cervical back: Neck supple.   Skin:    General: Skin is warm.   Neurological:     Mental Status: He is alert and oriented to person, place, and time.   Psychiatric:        Behavior: Behavior normal.        Thought Content: Thought content normal.        Judgment: Judgment normal.     Ortho Exam Exam of the right thigh shows no redness or cellulitis.  There is a boil towards the distal aspect of the surgical scar that is fluctuant and indurated.  There is no open wounds or drainage.  It is slightly tender to palpation.  He has no pain with hip movement.  The thigh is otherwise soft.  Imaging: XR HIP UNILAT W OR W/O PELVIS 2-3 VIEWS RIGHT Result Date: 01/17/2024 Stable  right total hip replacement without complication.    PMFS History: Patient Active Problem List   Diagnosis Date Noted   Abscess of right hip 01/17/2024   Open wound of right hip and thigh 09/18/2023   Status post total replacement of right hip 06/20/2022   Primary osteoarthritis of right hip 05/18/2022   Loud snoring 05/11/2021   Hypersomnia with sleep apnea 05/11/2021   CPAP (continuous positive airway pressure) dependence 05/11/2021   OSA on CPAP 09/27/2014   OSA (obstructive sleep apnea) 02/24/2014   Gait instability 09/16/2013   Hypertension 09/16/2013   Hyperlipidemia 09/16/2013   TIA (transient ischemic attack) 09/16/2013   Past Medical History:  Diagnosis Date   Gait instability 08/23/2013   GERD (gastroesophageal reflux disease)    Hypertension    Impaired fasting glucose    OSA (obstructive sleep apnea) 02/24/2014   Pure hypercholesterolemia    Skin cancer of face    not melanoma   Sleep apnea    TIA (transient ischemic attack)    2015    Family History  Problem Relation Age of Onset   Breast cancer Mother        after age 2   Congestive Heart Failure Mother    Coronary artery disease Father    Coronary artery disease Brother 21    Past Surgical History:  Procedure Laterality Date   COLONOSCOPY     DEBRIDEMENT AND CLOSURE WOUND Right 09/18/2023   Procedure: IRRIGATION AND DEBRIDEMEN RIGHT HIP AND CLOSURE WOUND;  Surgeon: Jerri Kay HERO, MD;  Location: [facility];  Service: Orthopedics;  Laterality: Right;   none     TEE WITHOUT CARDIOVERSION N/A 11/11/2014   Procedure: TRANSESOPHAGEAL ECHOCARDIOGRAM (TEE);  Surgeon: Gordy Bergamo, MD;  Location: [facility];  Service: Cardiovascular;  Laterality: N/A;  h/p in file drawer   TOTAL HIP ARTHROPLASTY Right 06/20/2022   Procedure: RIGHT TOTAL HIP ARTHROPLASTY ANTERIOR APPROACH;  Surgeon: Jerri Kay HERO, MD;  Location: [facility] OR;  Service: Orthopedics;  Laterality: Right;  3-C   Social History    Occupational History   Occupation: Chief Strategy Officer: Production assistant, radio FOR SELF EMPLOYED  Tobacco Use   Smoking status: Former    Current packs/day: 0.00    Types: Cigarettes    Start date: 07/24/1963    Quit date: 07/23/1968    Years since quitting: 55.5  Smokeless tobacco: Never   Tobacco comments:    pt quit July 1997  Vaping Use   Vaping status: Never Used  Substance and Sexual Activity   Alcohol  use: Yes    Comment: 2-3days a week- 1 beer   Drug use: No   Sexual activity: Yes    Partners: Female

## 2024-01-17 NOTE — Progress Notes (Addendum)
 Case: [id number] Date/Time: 01/20/24 1324   Procedure: IRRIGATION AND DEBRIDEMENT HIP (Right: Hip) - RIGHT HIP I & D   Anesthesia type: Choice   Diagnosis: Abscess of hip, right [L02.415]   Pre-op diagnosis: right hip abscess   [facility] RO[facility] 05 / [facility]   Surgeons: Jerri Kay HERO, MD       DISCUSSION: Caleb Rogers is a 73 yo male who presents to PAT prior to surgery above. PMH of former smoking, HTN, CAD (by CT), carotid artery stenosis, OSA (uses CPAP), asbestos exposure, hx of CVA/TIA (2015), GERD, prediabetes, arthritis, MGUS.  Patient had R THA in 2023. Had wound dehiscence and underwent I&D with wound closure on 09/18/2023. No complications noted. Patient followed up in clinic and on 3/27 visit it was noted that wound had healed and to f/u prn. Seen by Dr. Jerri again on 6/27 due to development of abscess of R hip. Stat CT currently ordered, not scheduled yet. Doxy prescribed. A washout was recommended. Discussed with Dr. Jerri who recommends proceeding if Dr. Ladona believes his symptoms are not prohibitive for surgery.  Seen by Dr. Ladona with Cardiology on 6/27. Patient referred by his PCP due to worsening DOE and fatigue. Nuclear stress 06/2018 was nonischemic.  Echo 07/2022 showed EF 60 to 65% with trace AI and MR and mild TR. A stress test was ordered to further evaluate the patients symptoms which is currently scheduled for 7/10. Per Dr. Ladona: Although I have recommended a stress test, he can be taken up for the surgery which is scheduled for this Monday on 01/20/2024 with low risk.  Stress is being performed to exclude significant CAD that is making his exercise capacity low and fatigue.  My suspicion is that chronic underlying inflammation and infection is leading to his present symptomatology; Will send a letter to [name] Jerri, MD to proceed with planned surgery on 01/20/2024.  Patient follows with Oncology for hx of MGUS. Currently under surveillance.  Patient gets periodic CT Chests at  his PCPs office due to hx of asbestos exposure with bilateral calcified pleural plaques. Also has mild bronchiectasis. Has not had to see Pulmonology. Last CT in 2023 was stable  Discussed with Dr. FORBES Needle  VS:  Wt Readings from Last 3 Encounters:  01/16/24 91.4 kg  01/01/24 92.4 kg  09/19/23 91.5 kg   Temp Readings from Last 3 Encounters:  01/01/24 (!) 36.3 C (Temporal)  09/18/23 (!) 36.2 C  07/03/23 (!) 36.2 C (Temporal)   BP Readings from Last 3 Encounters:  01/16/24 (!) 147/71  01/01/24 (!) 147/92  09/19/23 130/82   Pulse Readings from Last 3 Encounters:  01/16/24 (!) 45  01/01/24 (!) 50  09/19/23 60    PROVIDERS: Shayne Anes, MD   LABS: Labs reviewed: Acceptable for surgery. (all labs ordered are listed, but only abnormal results are displayed)  Labs Reviewed - No data to display   IMAGES: CT Chest 09/20/2021:  IMPRESSION: 1. Stable findings of asbestos exposure. 2. No acute intrathoracic process. 3. Stable mild bibasilar bronchiectasis. 4. Aortic Atherosclerosis (ICD10-I70.0). Coronary artery atherosclerosis.  EKG 01/16/24:  Sinus bradycardia at rate of 50 bpm, left anterior fascicular block. Incomplete right bundle branch block. Compared to 09/16/2023, no significant change  CV: Echocardiogram 07/31/2022: Normal LV systolic function with visual EF 60-65%. Left ventricle cavity is normal in size. Normal left ventricular wall thickness. Normal global wall motion. Normal diastolic filling pattern, normal LAP. Left atrial cavity is normal in size. The interatrial  Septum is thin and mobile but appears to be intact by 2D and CF Doppler interrogation. Trace aortic regurgitation. Trace mitral regurgitation. Mild tricuspid regurgitation. No evidence of pulmonary hypertension. Compared to 07/05/2018 severity of AR, MR, and TR have improved otherwise no significant change.   Nuclear stress test  06/30/2018 (from cardiology notes): 1. The patient  performed treadmill exercise using Bruce protocol, completing 7:41 minutes. The patient completed an estimated workload of 9.6 METS, reaching 88% of the maximum predicted heart rate. Normal hemodynamic response seen. No stress symptoms reported. Exercise capacity was normal. No ischemic changes seen on stress electrocardiogram. 2. The overall quality of the study is good. There is no evidence of abnormal lung activity. Stress and rest SPECT images demonstrate homogeneous tracer distribution throughout the myocardium. Gated SPECT imaging reveals normal myocardial thickening and wall motion. The left ventricular ejection fraction was calculated 45%, although visually appears normal. 3. Intermediate risk due to reduced LVEF. Recommend correlation with echocardiogram.  CT calcium  score 05/27/2018:  IMPRESSION: 1. Coronary calcium  score is 1897 and this is at percentile 94 for subjects of the same age, gender and ethnicity. 2. Bilateral pleural calcifications. Findings are suggestive for prior asbestos exposure. 3. Indeterminate 6 mm nodule in the medial right lower lobe. Non-contrast chest CT at 6-12 months is recommended. If the nodule is stable at time of repeat CT, then future CT at 18-24 months (from today's scan) is considered optional for low-risk patients, but is recommended for high-risk patients. This recommendation follows the consensus statement: Guidelines for Management of Incidental Pulmonary Nodules Detected on CT Images: From the Fleischner Society 2017; Radiology 2017; 284:228-243. 4.  Aortic Atherosclerosis (ICD10-I70.0).  Past Medical History:  Diagnosis Date   Gait instability 08/23/2013   GERD (gastroesophageal reflux disease)    Hypertension    Impaired fasting glucose    OSA (obstructive sleep apnea) 02/24/2014   Pure hypercholesterolemia    Skin cancer of face    not melanoma   Sleep apnea    TIA (transient ischemic attack)    2015    Past Surgical History:   Procedure Laterality Date   COLONOSCOPY     DEBRIDEMENT AND CLOSURE WOUND Right 09/18/2023   Procedure: IRRIGATION AND DEBRIDEMEN RIGHT HIP AND CLOSURE WOUND;  Surgeon: Jerri Kay HERO, MD;  Location: [facility];  Service: Orthopedics;  Laterality: Right;   none     TEE WITHOUT CARDIOVERSION N/A 11/11/2014   Procedure: TRANSESOPHAGEAL ECHOCARDIOGRAM (TEE);  Surgeon: Gordy Bergamo, MD;  Location: [facility];  Service: Cardiovascular;  Laterality: N/A;  h/p in file drawer   TOTAL HIP ARTHROPLASTY Right 06/20/2022   Procedure: RIGHT TOTAL HIP ARTHROPLASTY ANTERIOR APPROACH;  Surgeon: Jerri Kay HERO, MD;  Location: [facility];  Service: Orthopedics;  Laterality: Right;  3-C    MEDICATIONS:  [START ON 01/20/2024] tranexamic acid  (CYKLOKAPRON ) 2,000 mg in sodium chloride  0.9 % 50 mL Topical Application    acetaminophen  (TYLENOL ) 500 MG tablet   aspirin  EC 81 MG tablet   b complex vitamins capsule   ezetimibe  (ZETIA ) 10 MG tablet   ferrous sulfate  325 (65 FE) MG tablet   ibuprofen (ADVIL) 200 MG tablet   Misc Natural Products (BEET ROOT PO)   omeprazole (PRILOSEC OTC) 20 MG tablet   OVER THE COUNTER MEDICATION   OVER THE COUNTER MEDICATION   REPATHA SURECLICK 140 MG/ML SOAJ   valsartan -hydrochlorothiazide  (DIOVAN -HCT) 160-25 MG tablet   amLODipine  (NORVASC ) 5 MG tablet   chlorhexidine  (HIBICLENS ) 4 % external liquid  doxycycline  (VIBRA -TABS) 100 MG tablet   mupirocin  ointment (BACTROBAN ) 2 %   Burnard CHRISTELLA Odis DEVONNA [facility] Surgical Short Stay/Anesthesiology [facility] Phone [phone] 01/17/2024 1:09 PM

## 2024-01-19 ENCOUNTER — Encounter: Payer: Self-pay | Admitting: Cardiology

## 2024-01-20 ENCOUNTER — Other Ambulatory Visit: Payer: Self-pay | Admitting: Physician Assistant

## 2024-01-20 ENCOUNTER — Other Ambulatory Visit (HOSPITAL_COMMUNITY): Payer: Self-pay

## 2024-01-20 ENCOUNTER — Encounter (HOSPITAL_COMMUNITY): Payer: Self-pay | Admitting: Orthopaedic Surgery

## 2024-01-20 ENCOUNTER — Encounter (HOSPITAL_COMMUNITY): Payer: Self-pay | Admitting: Anesthesiology

## 2024-01-20 ENCOUNTER — Encounter (HOSPITAL_COMMUNITY)
Admission: RE | Disposition: A | Discharge status: Home / Self Care | Payer: Self-pay | Source: Home / Self Care | Attending: Orthopaedic Surgery

## 2024-01-20 ENCOUNTER — Observation Stay (HOSPITAL_COMMUNITY)
Admission: RE | Admit: 2024-01-20 | Discharge: 2024-01-22 | Disposition: A | Discharge status: Home / Self Care | Attending: Orthopaedic Surgery | Admitting: Orthopaedic Surgery

## 2024-01-20 ENCOUNTER — Ambulatory Visit (HOSPITAL_BASED_OUTPATIENT_CLINIC_OR_DEPARTMENT_OTHER): Payer: Self-pay | Admitting: Anesthesiology

## 2024-01-20 ENCOUNTER — Other Ambulatory Visit: Payer: Self-pay

## 2024-01-20 ENCOUNTER — Telehealth: Payer: Self-pay

## 2024-01-20 DIAGNOSIS — I10 Essential (primary) hypertension: Secondary | ICD-10-CM | POA: Insufficient documentation

## 2024-01-20 DIAGNOSIS — G4733 Obstructive sleep apnea (adult) (pediatric): Secondary | ICD-10-CM | POA: Diagnosis not present

## 2024-01-20 DIAGNOSIS — Z87891 Personal history of nicotine dependence: Secondary | ICD-10-CM

## 2024-01-20 DIAGNOSIS — M71051 Abscess of bursa, right hip: Principal | ICD-10-CM | POA: Insufficient documentation

## 2024-01-20 DIAGNOSIS — Z7982 Long term (current) use of aspirin: Secondary | ICD-10-CM | POA: Insufficient documentation

## 2024-01-20 DIAGNOSIS — Z85828 Personal history of other malignant neoplasm of skin: Secondary | ICD-10-CM | POA: Insufficient documentation

## 2024-01-20 DIAGNOSIS — Z79899 Other long term (current) drug therapy: Secondary | ICD-10-CM | POA: Diagnosis not present

## 2024-01-20 DIAGNOSIS — Z8673 Personal history of transient ischemic attack (TIA), and cerebral infarction without residual deficits: Secondary | ICD-10-CM | POA: Diagnosis not present

## 2024-01-20 DIAGNOSIS — L02415 Cutaneous abscess of right lower limb: Principal | ICD-10-CM

## 2024-01-20 DIAGNOSIS — Z96641 Presence of right artificial hip joint: Secondary | ICD-10-CM

## 2024-01-20 HISTORY — PX: APPLICATION OF WOUND VAC: SHX5189

## 2024-01-20 HISTORY — PX: INCISION AND DRAINAGE HIP: SHX1801

## 2024-01-20 LAB — CBC WITH DIFFERENTIAL/PLATELET
Abs Immature Granulocytes: 0.01 10*3/uL (ref 0.00–0.07)
Basophils Absolute: 0.1 10*3/uL (ref 0.0–0.1)
Basophils Relative: 1 %
Eosinophils Absolute: 0.2 10*3/uL (ref 0.0–0.5)
Eosinophils Relative: 3 %
HCT: 38.3 % — ABNORMAL LOW (ref 39.0–52.0)
Hemoglobin: 12.9 g/dL — ABNORMAL LOW (ref 13.0–17.0)
Immature Granulocytes: 0 %
Lymphocytes Relative: 34 %
Lymphs Abs: 2.1 10*3/uL (ref 0.7–4.0)
MCH: 29.6 pg (ref 26.0–34.0)
MCHC: 33.7 g/dL (ref 30.0–36.0)
MCV: 87.8 fL (ref 80.0–100.0)
Monocytes Absolute: 0.6 10*3/uL (ref 0.1–1.0)
Monocytes Relative: 10 %
Neutro Abs: 3.2 10*3/uL (ref 1.7–7.7)
Neutrophils Relative %: 52 %
Platelets: 343 10*3/uL (ref 150–400)
RBC: 4.36 MIL/uL (ref 4.22–5.81)
RDW: 13.7 % (ref 11.5–15.5)
WBC: 6.1 10*3/uL (ref 4.0–10.5)
nRBC: 0 % (ref 0.0–0.2)

## 2024-01-20 LAB — C-REACTIVE PROTEIN: CRP: 0.6 mg/dL (ref <1.0)

## 2024-01-20 LAB — SEDIMENTATION RATE: Sed Rate: 43 mm/h — ABNORMAL HIGH (ref 0–16)

## 2024-01-20 SURGERY — IRRIGATION AND DEBRIDEMENT HIP
Anesthesia: General | Site: Hip | Laterality: Right

## 2024-01-20 MED ORDER — CHLORHEXIDINE GLUCONATE 0.12 % MT SOLN: 15.0000 mL | Freq: Once | OROMUCOSAL | Status: AC

## 2024-01-20 MED ORDER — ONDANSETRON HCL 4 MG/2ML IJ SOLN
INTRAMUSCULAR | Status: AC
Start: 2024-01-20 — End: 2024-01-20
  Filled 2024-01-20: qty 2

## 2024-01-20 MED ORDER — OXYCODONE HCL 5 MG PO TABS
10.0000 mg | ORAL_TABLET | ORAL | Status: DC | PRN
  Administered 2024-01-20: 10 mg via ORAL
  Filled 2024-01-20: qty 3

## 2024-01-20 MED ORDER — METOCLOPRAMIDE HCL 5 MG PO TABS: 5.0000 mg | ORAL_TABLET | Freq: Three times a day (TID) | ORAL | Status: DC | PRN

## 2024-01-20 MED ORDER — OXYCODONE HCL 5 MG PO TABS: 5.0000 mg | ORAL_TABLET | Freq: Once | ORAL | Status: DC | PRN

## 2024-01-20 MED ORDER — MIDAZOLAM HCL 2 MG/2ML IJ SOLN
INTRAMUSCULAR | Status: AC
  Filled 2024-01-20: qty 2

## 2024-01-20 MED ORDER — IRBESARTAN 150 MG PO TABS
150.0000 mg | ORAL_TABLET | Freq: Every day | ORAL | Status: DC
  Administered 2024-01-21 – 2024-01-22 (×2): 150 mg via ORAL
  Filled 2024-01-20 (×2): qty 1

## 2024-01-20 MED ORDER — CEFAZOLIN SODIUM 1 G IJ SOLR
INTRAMUSCULAR | Status: AC
  Filled 2024-01-20: qty 20

## 2024-01-20 MED ORDER — VANCOMYCIN HCL IN DEXTROSE 1-5 GM/200ML-% IV SOLN
1000.0000 mg | Freq: Two times a day (BID) | INTRAVENOUS | Status: DC
  Administered 2024-01-21 – 2024-01-22 (×3): 1000 mg via INTRAVENOUS
  Filled 2024-01-20 (×4): qty 200

## 2024-01-20 MED ORDER — LACTATED RINGERS IV SOLN: INTRAVENOUS | Status: DC

## 2024-01-20 MED ORDER — VANCOMYCIN HCL 500 MG IV SOLR
INTRAVENOUS | Status: AC
  Filled 2024-01-20: qty 10

## 2024-01-20 MED ORDER — PROPOFOL 10 MG/ML IV BOLUS
INTRAVENOUS | Status: DC | PRN
  Administered 2024-01-20: 40 mg via INTRAVENOUS
  Administered 2024-01-20: 160 mg via INTRAVENOUS

## 2024-01-20 MED ORDER — DOCUSATE SODIUM 100 MG PO CAPS
100.0000 mg | ORAL_CAPSULE | Freq: Two times a day (BID) | ORAL | Status: DC
  Administered 2024-01-20 – 2024-01-22 (×4): 100 mg via ORAL
  Filled 2024-01-20 (×4): qty 1

## 2024-01-20 MED ORDER — FENTANYL CITRATE (PF) 100 MCG/2ML IJ SOLN
INTRAMUSCULAR | Status: AC
  Filled 2024-01-20: qty 2

## 2024-01-20 MED ORDER — VANCOMYCIN HCL 1000 MG IV SOLR
INTRAVENOUS | Status: AC
  Filled 2024-01-20: qty 20

## 2024-01-20 MED ORDER — VALSARTAN-HYDROCHLOROTHIAZIDE 160-25 MG PO TABS: 1.0000 | ORAL_TABLET | Freq: Every day | ORAL | Status: DC

## 2024-01-20 MED ORDER — POVIDONE-IODINE 10 % EX SWAB: 2.0000 | Freq: Once | CUTANEOUS | Status: DC

## 2024-01-20 MED ORDER — OXYCODONE HCL 5 MG PO TABS
ORAL_TABLET | ORAL | Status: AC
  Filled 2024-01-20: qty 2

## 2024-01-20 MED ORDER — METOCLOPRAMIDE HCL 5 MG/ML IJ SOLN: 5.0000 mg | Freq: Three times a day (TID) | INTRAMUSCULAR | Status: DC | PRN

## 2024-01-20 MED ORDER — ACETAMINOPHEN 10 MG/ML IV SOLN
INTRAVENOUS | Status: AC
  Filled 2024-01-20: qty 100

## 2024-01-20 MED ORDER — HYDROMORPHONE HCL 1 MG/ML IJ SOLN
INTRAMUSCULAR | Status: AC
  Filled 2024-01-20: qty 0.5

## 2024-01-20 MED ORDER — ACETAMINOPHEN 500 MG PO TABS
1000.0000 mg | ORAL_TABLET | Freq: Four times a day (QID) | ORAL | Status: AC
  Administered 2024-01-20 – 2024-01-21 (×4): 1000 mg via ORAL
  Filled 2024-01-20 (×4): qty 2

## 2024-01-20 MED ORDER — DEXMEDETOMIDINE HCL IN NACL 80 MCG/20ML IV SOLN
INTRAVENOUS | Status: DC | PRN
Start: 2024-01-20 — End: 2024-01-20
  Administered 2024-01-20: 4 ug via INTRAVENOUS

## 2024-01-20 MED ORDER — DIPHENHYDRAMINE HCL 12.5 MG/5ML PO ELIX
25.0000 mg | ORAL_SOLUTION | ORAL | Status: DC | PRN
Start: 2024-01-20 — End: 2024-01-22
  Administered 2024-01-21 (×2): 25 mg via ORAL
  Filled 2024-01-20 (×2): qty 10

## 2024-01-20 MED ORDER — TRANEXAMIC ACID-NACL 1000-0.7 MG/100ML-% IV SOLN
1000.0000 mg | INTRAVENOUS | Status: AC
  Administered 2024-01-20: 1000 mg via INTRAVENOUS

## 2024-01-20 MED ORDER — LIDOCAINE 2% (20 MG/ML) 5 ML SYRINGE
INTRAMUSCULAR | Status: DC | PRN
  Administered 2024-01-20: 100 mg via INTRAVENOUS

## 2024-01-20 MED ORDER — ACETAMINOPHEN 10 MG/ML IV SOLN
INTRAVENOUS | Status: DC | PRN
Start: 2024-01-20 — End: 2024-01-20
  Administered 2024-01-20: 1000 mg via INTRAVENOUS

## 2024-01-20 MED ORDER — OXYCODONE HCL 5 MG/5ML PO SOLN: 5.0000 mg | Freq: Once | ORAL | Status: DC | PRN

## 2024-01-20 MED ORDER — ONDANSETRON HCL 4 MG/2ML IJ SOLN
4.0000 mg | Freq: Once | INTRAMUSCULAR | Status: AC | PRN
  Administered 2024-01-20: 4 mg via INTRAVENOUS

## 2024-01-20 MED ORDER — HYDROMORPHONE HCL 1 MG/ML IJ SOLN: 0.5000 mg | INTRAMUSCULAR | Status: DC | PRN

## 2024-01-20 MED ORDER — CHLORHEXIDINE GLUCONATE 0.12 % MT SOLN
OROMUCOSAL | Status: AC
  Administered 2024-01-20: 15 mL via OROMUCOSAL
  Filled 2024-01-20: qty 15

## 2024-01-20 MED ORDER — MAGNESIUM CITRATE PO SOLN
1.0000 | Freq: Once | ORAL | Status: DC | PRN
Start: 2024-01-20 — End: 2024-01-22

## 2024-01-20 MED ORDER — VANCOMYCIN HCL 1000 MG IV SOLR
INTRAVENOUS | Status: DC | PRN
  Administered 2024-01-20: 1000 mg via TOPICAL

## 2024-01-20 MED ORDER — VANCOMYCIN HCL 1500 MG/300ML IV SOLN
1500.0000 mg | INTRAVENOUS | Status: DC
  Administered 2024-01-20: 1500 mg via INTRAVENOUS

## 2024-01-20 MED ORDER — CEFAZOLIN SODIUM-DEXTROSE 2-4 GM/100ML-% IV SOLN
2.0000 g | Freq: Four times a day (QID) | INTRAVENOUS | Status: DC
  Administered 2024-01-20: 2 g via INTRAVENOUS
  Filled 2024-01-20: qty 100

## 2024-01-20 MED ORDER — TRANEXAMIC ACID-NACL 1000-0.7 MG/100ML-% IV SOLN
1000.0000 mg | Freq: Once | INTRAVENOUS | Status: AC
  Administered 2024-01-20: 1000 mg via INTRAVENOUS
  Filled 2024-01-20: qty 100

## 2024-01-20 MED ORDER — ONDANSETRON HCL 4 MG PO TABS: 4.0000 mg | ORAL_TABLET | Freq: Four times a day (QID) | ORAL | Status: DC | PRN

## 2024-01-20 MED ORDER — DEXAMETHASONE SODIUM PHOSPHATE 10 MG/ML IJ SOLN
INTRAMUSCULAR | Status: DC | PRN
  Administered 2024-01-20: 10 mg via INTRAVENOUS

## 2024-01-20 MED ORDER — METHOCARBAMOL 1000 MG/10ML IJ SOLN
500.0000 mg | Freq: Four times a day (QID) | INTRAMUSCULAR | Status: DC | PRN
Start: 2024-01-20 — End: 2024-01-22

## 2024-01-20 MED ORDER — FENTANYL CITRATE (PF) 250 MCG/5ML IJ SOLN
INTRAMUSCULAR | Status: AC
  Filled 2024-01-20: qty 5

## 2024-01-20 MED ORDER — POLYETHYLENE GLYCOL 3350 17 G PO PACK: 17.0000 g | PACK | Freq: Every day | ORAL | Status: DC | PRN

## 2024-01-20 MED ORDER — OXYCODONE HCL 5 MG PO TABS
5.0000 mg | ORAL_TABLET | ORAL | Status: DC | PRN
  Administered 2024-01-20: 10 mg via ORAL
  Administered 2024-01-21 (×2): 5 mg via ORAL
  Filled 2024-01-20: qty 2
  Filled 2024-01-20: qty 1
  Filled 2024-01-20: qty 2

## 2024-01-20 MED ORDER — HYDROCHLOROTHIAZIDE 25 MG PO TABS
25.0000 mg | ORAL_TABLET | Freq: Every day | ORAL | Status: DC
  Administered 2024-01-21 – 2024-01-22 (×2): 25 mg via ORAL
  Filled 2024-01-20 (×2): qty 1

## 2024-01-20 MED ORDER — MEPERIDINE HCL 25 MG/ML IJ SOLN: 6.2500 mg | INTRAMUSCULAR | Status: DC | PRN

## 2024-01-20 MED ORDER — METHOCARBAMOL 500 MG PO TABS
500.0000 mg | ORAL_TABLET | Freq: Two times a day (BID) | ORAL | 2 refills | Status: AC | PRN
  Filled 2024-01-20: qty 20, 10d supply, fill #0

## 2024-01-20 MED ORDER — ONDANSETRON HCL 4 MG PO TABS
4.0000 mg | ORAL_TABLET | Freq: Three times a day (TID) | ORAL | 0 refills | Status: AC | PRN
  Filled 2024-01-20: qty 40, 14d supply, fill #0

## 2024-01-20 MED ORDER — MIDAZOLAM HCL 2 MG/2ML IJ SOLN
INTRAMUSCULAR | Status: DC | PRN
  Administered 2024-01-20: 2 mg via INTRAVENOUS

## 2024-01-20 MED ORDER — ACETAMINOPHEN 325 MG PO TABS
325.0000 mg | ORAL_TABLET | Freq: Four times a day (QID) | ORAL | Status: DC | PRN
  Administered 2024-01-21 – 2024-01-22 (×2): 650 mg via ORAL
  Filled 2024-01-20 (×2): qty 2

## 2024-01-20 MED ORDER — SORBITOL 70 % SOLN
30.0000 mL | Freq: Every day | Status: DC | PRN
Start: 2024-01-20 — End: 2024-01-22

## 2024-01-20 MED ORDER — ORAL CARE MOUTH RINSE: 15.0000 mL | Freq: Once | OROMUCOSAL | Status: AC

## 2024-01-20 MED ORDER — EPHEDRINE SULFATE (PRESSORS) 50 MG/ML IJ SOLN
INTRAMUSCULAR | Status: DC | PRN
Start: 2024-01-20 — End: 2024-01-20
  Administered 2024-01-20: 7.5 mg via INTRAVENOUS
  Administered 2024-01-20 (×2): 5 mg via INTRAVENOUS

## 2024-01-20 MED ORDER — DOXYCYCLINE HYCLATE 100 MG PO TABS
100.0000 mg | ORAL_TABLET | Freq: Two times a day (BID) | ORAL | Status: DC
  Administered 2024-01-20 – 2024-01-22 (×4): 100 mg via ORAL
  Filled 2024-01-20 (×4): qty 1

## 2024-01-20 MED ORDER — HYDROMORPHONE HCL 1 MG/ML IJ SOLN
INTRAMUSCULAR | Status: DC | PRN
  Administered 2024-01-20 (×2): .25 mg via INTRAVENOUS

## 2024-01-20 MED ORDER — FENTANYL CITRATE (PF) 100 MCG/2ML IJ SOLN
INTRAMUSCULAR | Status: DC | PRN
  Administered 2024-01-20: 25 ug via INTRAVENOUS
  Administered 2024-01-20: 75 ug via INTRAVENOUS
  Administered 2024-01-20 (×4): 25 ug via INTRAVENOUS

## 2024-01-20 MED ORDER — AMLODIPINE BESYLATE 5 MG PO TABS
5.0000 mg | ORAL_TABLET | Freq: Every day | ORAL | Status: DC
  Administered 2024-01-21 – 2024-01-22 (×2): 5 mg via ORAL
  Filled 2024-01-20 (×2): qty 1

## 2024-01-20 MED ORDER — METHOCARBAMOL 500 MG PO TABS
500.0000 mg | ORAL_TABLET | Freq: Four times a day (QID) | ORAL | Status: DC | PRN
  Administered 2024-01-20: 500 mg via ORAL
  Filled 2024-01-20: qty 1

## 2024-01-20 MED ORDER — OXYCODONE-ACETAMINOPHEN 5-325 MG PO TABS
1.0000 | ORAL_TABLET | Freq: Four times a day (QID) | ORAL | 0 refills | Status: DC | PRN
  Filled 2024-01-20: qty 40, 7d supply, fill #0

## 2024-01-20 MED ORDER — ONDANSETRON HCL 4 MG/2ML IJ SOLN: 4.0000 mg | Freq: Four times a day (QID) | INTRAMUSCULAR | Status: DC | PRN

## 2024-01-20 MED ORDER — TRANEXAMIC ACID-NACL 1000-0.7 MG/100ML-% IV SOLN
INTRAVENOUS | Status: AC
  Filled 2024-01-20: qty 100

## 2024-01-20 MED ORDER — FENTANYL CITRATE (PF) 100 MCG/2ML IJ SOLN
25.0000 ug | INTRAMUSCULAR | Status: DC | PRN
  Administered 2024-01-20 (×2): 50 ug via INTRAVENOUS

## 2024-01-20 SURGICAL SUPPLY — 45 items
BAG COUNTER SPONGE SURGICOUNT (BAG) ×2 IMPLANT
COVER SURGICAL LIGHT HANDLE (MISCELLANEOUS) ×2 IMPLANT
DRAPE IMP U-DRAPE 54X76 (DRAPES) ×2 IMPLANT
DRAPE INCISE IOBAN 85X60 (DRAPES) IMPLANT
DRAPE STERI IOBAN 125X83 (DRAPES) IMPLANT
DRESSING PEEL AND PLAC PRVNA20 (GAUZE/BANDAGES/DRESSINGS) IMPLANT
DRSG AQUACEL AG ADV 3.5X10 (GAUZE/BANDAGES/DRESSINGS) IMPLANT
DRSG MEPILEX POST OP 4X8 (GAUZE/BANDAGES/DRESSINGS) IMPLANT
DRSG VAC PEEL AND PLACE LRG (GAUZE/BANDAGES/DRESSINGS) IMPLANT
DURAPREP 26ML APPLICATOR (WOUND CARE) ×2 IMPLANT
ELECT CAUTERY BLADE 6.4 (BLADE) ×2 IMPLANT
ELECTRODE BLDE 4.0 EZ CLN MEGD (MISCELLANEOUS) IMPLANT
ELECTRODE REM PT RTRN 9FT ADLT (ELECTROSURGICAL) ×2 IMPLANT
EVACUATOR 1/8 PVC DRAIN (DRAIN) IMPLANT
FACESHIELD WRAPAROUND (MASK) IMPLANT
FACESHIELD WRAPAROUND OR TEAM (MASK) IMPLANT
GLOVE BIOGEL PI IND STRL 7.0 (GLOVE) ×4 IMPLANT
GLOVE BIOGEL PI IND STRL 7.5 (GLOVE) ×2 IMPLANT
GLOVE ECLIPSE 7.0 STRL STRAW (GLOVE) ×2 IMPLANT
GLOVE SKINSENSE STRL SZ7.5 (GLOVE) ×4 IMPLANT
GLOVE SURG SYN 7.5 E (GLOVE) ×4 IMPLANT
GLOVE SURG SYN 7.5 PF PI (GLOVE) ×4 IMPLANT
GLOVE SURG UNDER POLY LF SZ7 (GLOVE) ×38 IMPLANT
GLOVE SURG UNDER POLY LF SZ7.5 (GLOVE) ×8 IMPLANT
GOWN STRL SURGICAL XL XLNG (GOWN DISPOSABLE) ×2 IMPLANT
HOOD PEEL AWAY T7 (MISCELLANEOUS) IMPLANT
KIT BASIN OR (CUSTOM PROCEDURE TRAY) ×2 IMPLANT
KIT TURNOVER KIT B (KITS) ×2 IMPLANT
MANIFOLD NEPTUNE II (INSTRUMENTS) ×2 IMPLANT
NS IRRIG 1000ML POUR BTL (IV SOLUTION) ×2 IMPLANT
PACK SHOULDER (CUSTOM PROCEDURE TRAY) ×2 IMPLANT
PACK UNIVERSAL I (CUSTOM PROCEDURE TRAY) ×2 IMPLANT
PAD ARMBOARD POSITIONER FOAM (MISCELLANEOUS) ×4 IMPLANT
SET HNDPC FAN SPRY TIP SCT (DISPOSABLE) ×2 IMPLANT
SPONGE T-LAP 18X18 ~~LOC~~+RFID (SPONGE) IMPLANT
STAPLER SKIN PROX 35W (STAPLE) IMPLANT
SUT ETHILON 2 0 PSLX (SUTURE) IMPLANT
SUT MON AB 2-0 CT1 36 (SUTURE) IMPLANT
SUT PDS AB 0 CT 36 (SUTURE) IMPLANT
SUT PDS AB 1 CT 36 (SUTURE) ×4 IMPLANT
SUT VIC AB 0 CT1 27XBRD ANBCTR (SUTURE) IMPLANT
SUT VIC AB 1 CTB1 27 (SUTURE) IMPLANT
SUT VIC AB 2-0 CT1 TAPERPNT 27 (SUTURE) IMPLANT
TOWEL GREEN STERILE (TOWEL DISPOSABLE) ×2 IMPLANT
TOWEL GREEN STERILE FF (TOWEL DISPOSABLE) ×2 IMPLANT

## 2024-01-20 NOTE — Telephone Encounter (Signed)
 [facility] called and stated the order for the CT of hip needs to be changed from with and without to with contract

## 2024-01-20 NOTE — Anesthesia Procedure Notes (Signed)
 Procedure Name: LMA Insertion Date/Time: 01/20/2024 3:24 PM  Performed by: Catheline Terrall NOVAK, RNPre-anesthesia Checklist: Patient identified, Emergency Drugs available, Suction available, Patient being monitored and Timeout performed Patient Re-evaluated:Patient Re-evaluated prior to induction Oxygen Delivery Method: Circle system utilized Preoxygenation: Pre-oxygenation with 100% oxygen Induction Type: IV induction Ventilation: Mask ventilation without difficulty LMA: LMA inserted LMA Size: 4.0 Number of attempts: 2 Placement Confirmation: positive ETCO2 and breath sounds checked- equal and bilateral Dental Injury: Teeth and Oropharynx as per pre-operative assessment

## 2024-01-20 NOTE — H&P (Signed)
 PREOPERATIVE H&P  Chief Complaint: right hip abscess  HPI: Caleb Rogers is a 72 y.o. male who presents for surgical treatment of right hip abscess.  He denies any changes in medical history.  Past Surgical History:  Procedure Laterality Date   COLONOSCOPY     DEBRIDEMENT AND CLOSURE WOUND Right 09/18/2023   Procedure: IRRIGATION AND DEBRIDEMEN RIGHT HIP AND CLOSURE WOUND;  Surgeon: Caleb Kay HERO, MD;  Location: [facility];  Service: Orthopedics;  Laterality: Right;   none     TEE WITHOUT CARDIOVERSION N/A 11/11/2014   Procedure: TRANSESOPHAGEAL ECHOCARDIOGRAM (TEE);  Surgeon: Caleb Bergamo, MD;  Location: [facility] ENDOSCOPY;  Service: Cardiovascular;  Laterality: N/A;  h/p in file drawer   TOTAL HIP ARTHROPLASTY Right 06/20/2022   Procedure: RIGHT TOTAL HIP ARTHROPLASTY ANTERIOR APPROACH;  Surgeon: Caleb Kay HERO, MD;  Location: [facility] OR;  Service: Orthopedics;  Laterality: Right;  3-C   Social History   Socioeconomic History   Marital status: Married    Spouse name: Caleb Rogers   Number of children: 2   Years of education: college   Highest education level: Not on file  Occupational History   Occupation: Chief Strategy Officer: Production assistant, radio FOR SELF EMPLOYED  Tobacco Use   Smoking status: Former    Current packs/day: 0.00    Types: Cigarettes    Start date: 07/24/1963    Quit date: 07/23/1968    Years since quitting: 55.5   Smokeless tobacco: Never   Tobacco comments:    pt quit July 1997  Vaping Use   Vaping status: Never Used  Substance and Sexual Activity   Alcohol  use: Yes    Comment: 2-3days a week- 1 beer   Drug use: No   Sexual activity: Yes    Partners: Female  Other Topics Concern   Not on file  Social History Narrative   Patient is married Caleb Rogers) and lives at home with his wife.   Patient has two adult children.   Patient is working full-time.   Patient has a college education.   Patient is right- handed.   Patient drinks two cups of coffee daily.    Social Drivers of Corporate investment banker Strain: Not on file  Food Insecurity: No Food Insecurity (06/20/2022)   Hunger Vital Sign    Worried About Running Out of Food in the Last Year: Never true    Ran Out of Food in the Last Year: Never true  Transportation Needs: No Transportation Needs (06/20/2022)   PRAPARE - Administrator, Civil Service (Medical): No    Lack of Transportation (Non-Medical): No  Physical Activity: Not on file  Stress: Not on file  Social Connections: Not on file   Family History  Problem Relation Age of Onset   Breast cancer Mother        after age 29   Congestive Heart Failure Mother    Coronary artery disease Father    Coronary artery disease Brother 19   Allergies  Allergen Reactions   Atorvastatin  Other (See Comments)    leg pains   Other Other (See Comments)    Tree & shrubs   Prior to Admission medications   Medication Sig Start Date End Date Taking? Authorizing Provider  acetaminophen  (TYLENOL ) 500 MG tablet Take 1,000 mg by mouth every 6 (six) hours as needed for moderate pain (pain score 4-6).   Yes [provider]  amLODipine  (NORVASC ) 5 MG tablet Take 1  tablet (5 mg total) by mouth daily. 01/16/24 04/15/24 Yes Caleb Heinz, MD  aspirin  EC 81 MG tablet Take 81 mg by mouth daily. Swallow whole.   Yes [provider]  b complex vitamins capsule Take 1 capsule by mouth daily.   Yes [provider]  chlorhexidine  (HIBICLENS ) 4 % external liquid Apply 15 mLs topically as directed for 30 doses.  Use as directed daily for 5 days every other week for 6 weeks 01/17/24  Yes Caleb Ronal CROME, PA-C  doxycycline  (VIBRA -TABS) 100 MG tablet Take 1 tablet (100 mg total) by mouth 2 (two) times daily. 01/17/24  Yes Caleb Kay HERO, MD  ezetimibe  (ZETIA ) 10 MG tablet Take 1 tablet (10 mg total) by mouth daily. 05/11/21  Yes Dohmeier, Dedra, MD  ferrous sulfate  325 (65 FE) MG tablet Take 325 mg by mouth daily.   Yes  [provider]  ibuprofen (ADVIL) 200 MG tablet Take 400 mg by mouth every 6 (six) hours as needed for moderate pain (pain score 4-6).   Yes [provider]  Misc Natural Products (BEET ROOT PO) Take 1 tablet by mouth daily.   Yes [provider]  mupirocin  ointment (BACTROBAN ) 2 % Place one application into the nose twice daily for 60 doses.  Use as directed 2 times daily for 5 days every other week for 6 weeks. 01/17/24  Yes Caleb Ronal CROME, PA-C  omeprazole (PRILOSEC OTC) 20 MG tablet Take 20 mg by mouth daily.   Yes [provider]  OVER THE COUNTER MEDICATION Take 1 capsule by mouth daily. NAD supplement   Yes [provider]  OVER THE COUNTER MEDICATION Take 1 tablet by mouth daily. Brain defender supplement   Yes [provider]  REPATHA SURECLICK 140 MG/ML SOAJ Inject 140 mg into the skin every 14 (fourteen) days. 05/05/21  Yes [provider]  valsartan -hydrochlorothiazide  (DIOVAN -HCT) 160-25 MG tablet Take 1 tablet by mouth daily. 08/29/23  Yes Caleb Heinz, MD     Positive ROS: All other systems have been reviewed and were otherwise negative with the exception of those mentioned in the HPI and as above.  Physical Exam: General: Alert, no acute distress Cardiovascular: No pedal edema Respiratory: No cyanosis, no use of accessory musculature GI: abdomen soft Skin: No lesions in the area of chief complaint Neurologic: Sensation intact distally Psychiatric: Patient is competent for consent with normal mood and affect Lymphatic: no lymphedema  MUSCULOSKELETAL: exam stable  Assessment: right hip abscess  Plan: Plan for Procedure(s): IRRIGATION AND DEBRIDEMENT HIP  The risks benefits and alternatives were discussed with the patient including but not limited to the risks of nonoperative treatment, versus surgical intervention including infection, bleeding, nerve injury,  blood clots, cardiopulmonary complications,  morbidity, mortality, among others, and they were willing to proceed.   Caleb Jerri, MD 01/20/2024 2:06 PM

## 2024-01-20 NOTE — Op Note (Signed)
   Date of Surgery: 01/20/2024  INDICATIONS: Caleb Rogers is a 73 y.o.-year-old male with a recurrent right hip boil.  The patient did consent to the procedure after discussion of the risks and benefits.  PREOPERATIVE DIAGNOSIS: Right hip boil/abscess  POSTOPERATIVE DIAGNOSIS: Same  PROCEDURE:  Incision and drainage of right hip abscess Application of incisional VAC  Debridement type: Excisional Debridement  Side: right  Body Location: hip   Tools used for debridement: scalpel, curette, and rongeur  SURGEON: N. [name], M.D.  ASSIST: Ronal [name], [place]; necessary for the timely completion of procedure and due to complexity of procedure.  ANESTHESIA:  general  IV FLUIDS AND URINE: See anesthesia.  ESTIMATED BLOOD LOSS: 50 mL.  IMPLANTS: None  DRAINS: incisional VAC  COMPLICATIONS: see description of procedure.  DESCRIPTION OF PROCEDURE: The patient was brought to the operating room.  The patient had been signed prior to the procedure and this was documented. The patient had the anesthesia placed by the anesthesiologist.  A time-out was performed to confirm that this was the correct patient, site, side and location. The patient did receive antibiotics after cultures had been obtained.  The patient had the operative extremity prepped and draped in the standard surgical fashion.    The previous surgical scar was incised and the boil was fully ellipsed sharply with a 10 blade.  There was murky fluid in the subcutaneous space that was immediately cultured.  Tissue in the superficial layer was also cultured.  The space between the fascia and the subcutaneous space was fully opened up.  I then palpated throughout the wound and found that there was a tract that communicated all the way down to the hip joint.  I can feel the prosthesis.  I aggressively debrided tissue around the sinus tract.  Deep tissue cultures and fluid cultures were also obtained from the joint.  There was  no gross purulence.  Rondure, scalpel, and curettes were used to perform sharp excisional debridement which included the skin, subcutaneous tissue, muscle.  6 L of normal saline was pulsed through the wound.  Vancomycin  powder was placed deep in the joint and the subcutaneous tissue.  Subcutaneous space was closed with 0 PDS.  Subcuticular layer closed with 2-0 Monocryl and the skin was closed with 2-0 nylon.  Incisional VAC was placed.  Patient tolerated procedure well had no any complications.  [name] was necessary for opening, closing, retracting, limb positioning and overall facilitation and timely completion of the procedure.  POSTOPERATIVE PLAN: Patient will be admitted to the hospital.  We will keep him on IV antibiotics and await culture speciation.  GEANNIE [name], MD 4:41 PM

## 2024-01-20 NOTE — Transfer of Care (Signed)
 Immediate Anesthesia Transfer of Care Note  Patient: Caleb Rogers  Procedure(s) Performed: IRRIGATION AND DEBRIDEMENT HIP (Right: Hip) APPLICATION, WOUND VAC, RIGHT HIP  Patient Location: PACU  Anesthesia Type:General  Level of Consciousness: awake and alert   Airway & Oxygen Therapy: Patient Spontanous Breathing and Patient connected to face mask oxygen  Post-op Assessment: Report given to RN and Post -op Vital signs reviewed and stable  Post vital signs: Reviewed and stable  Last Vitals:  Vitals Value Taken Time  BP 126/66 01/20/24 16:00  Temp    Pulse 56 01/20/24 16:01  Resp 12 01/20/24 16:01  SpO2 97 % 01/20/24 16:01  Vitals shown include unfiled device data.  Last Pain:  Vitals:   01/20/24 1148  TempSrc:   PainSc: 0-No pain         Complications: There were no known notable events for this encounter.

## 2024-01-20 NOTE — Anesthesia Preprocedure Evaluation (Addendum)
 Anesthesia Evaluation  Patient identified by MRN, date of birth, ID band Patient awake    Reviewed: Allergy & Precautions, NPO status , Patient's Chart, lab work & pertinent test results  Airway Mallampati: III  TM Distance: >3 FB Neck ROM: Full    Dental  (+) Chipped, Teeth Intact, Dental Advisory Given,    Pulmonary sleep apnea and Continuous Positive Airway Pressure Ventilation , former smoker   Pulmonary exam normal        Cardiovascular hypertension, Pt. on medications Normal cardiovascular exam  EKG 01/16/24:   Sinus bradycardia at rate of 50 bpm, left anterior fascicular block. Incomplete right bundle branch block. Compared to 09/16/2023, no significant change   CV: Echocardiogram 07/31/2022: Normal LV systolic function with visual EF 60-65%. Left ventricle cavity is normal in size. Normal left ventricular wall thickness. Normal global wall motion. Normal diastolic filling pattern, normal LAP. Left atrial cavity is normal in size. The interatrial Septum is thin and mobile but appears to be intact by 2D and CF Doppler interrogation. Trace aortic regurgitation. Trace mitral regurgitation. Mild tricuspid regurgitation. No evidence of pulmonary hypertension. Compared to 07/05/2018 severity of AR, MR, and TR have improved otherwise no significant change.    Neuro/Psych TIA negative psych ROS   GI/Hepatic Neg liver ROS,GERD  Medicated and Controlled,,  Endo/Other  negative endocrine ROS    Renal/GU negative Renal ROS     Musculoskeletal  (+) Arthritis ,    Abdominal   Peds  Hematology negative hematology ROS (+)   Anesthesia Other Findings right hip surgical dehiscence  Reproductive/Obstetrics                             Anesthesia Physical Anesthesia Plan  ASA: 3  Anesthesia Plan: General   Post-op Pain Management: Ofirmev  IV (intra-op)*   Induction: Intravenous  PONV Risk  Score and Plan: 1 and Ondansetron , Dexamethasone , Propofol  infusion, Treatment may vary due to age or medical condition and Midazolam   Airway Management Planned: LMA  Additional Equipment: None  Intra-op Plan:   Post-operative Plan:   Informed Consent: I have reviewed the patients History and Physical, chart, labs and discussed the procedure including the risks, benefits and alternatives for the proposed anesthesia with the patient or authorized representative who has indicated his/her understanding and acceptance.     Dental advisory given  Plan Discussed with: CRNA  Anesthesia Plan Comments: (DISCUSSION: Caleb Rogers is a 73 yo male who presents to PAT prior to surgery above. PMH of former smoking, HTN, CAD (by CT), carotid artery stenosis, OSA (uses CPAP), asbestos exposure, hx of CVA/TIA (2015), GERD, prediabetes, arthritis, MGUS.   Patient had R THA in 2023. Had wound dehiscence and underwent I&D with wound closure on 09/18/2023. No complications noted. Patient followed up in clinic and on 3/27 visit it was noted that wound had healed and to f/u prn. Seen by Dr. Jerri again on 6/27 due to development of abscess of R hip. Stat CT currently ordered, not scheduled yet. Doxy prescribed. A washout was recommended. Discussed with Dr. Jerri who recommends proceeding if Dr. Ladona believes his symptoms are not prohibitive for surgery.   Seen by Dr. Ladona with Cardiology on 6/27. Patient referred by his PCP due to worsening DOE and fatigue. Nuclear stress 06/2018 was nonischemic.  Echo 07/2022 showed EF 60 to 65% with trace AI and MR and mild TR. A stress test was ordered to further evaluate the patients symptoms  which is currently scheduled for 7/10. Per Dr. Ladona: Although I have recommended a stress test, he can be taken up for the surgery which is scheduled for this Monday on 01/20/2024 with low risk.  Stress is being performed to exclude significant CAD that is making his exercise capacity low and  fatigue.  My suspicion is that chronic underlying inflammation and infection is leading to his present symptomatology; Will send a letter to Caleb Cummins, MD to proceed with planned surgery on 01/20/2024. )       Anesthesia Quick Evaluation

## 2024-01-20 NOTE — Anesthesia Postprocedure Evaluation (Signed)
 Anesthesia Post Note  Patient: Caleb Rogers  Procedure(s) Performed: IRRIGATION AND DEBRIDEMENT HIP (Right: Hip) APPLICATION, WOUND VAC, RIGHT HIP     Patient location during evaluation: PACU Anesthesia Type: General Level of consciousness: awake and alert Pain management: pain level controlled Vital Signs Assessment: post-procedure vital signs reviewed and stable Respiratory status: spontaneous breathing, nonlabored ventilation, respiratory function stable and patient connected to nasal cannula oxygen Cardiovascular status: blood pressure returned to baseline and stable Postop Assessment: no apparent nausea or vomiting Anesthetic complications: no   There were no known notable events for this encounter.  Last Vitals:  Vitals:   01/20/24 1657 01/20/24 1724  BP: 137/70 139/72  Pulse: (!) 55 (!) 51  Resp: 18 17  Temp: 36.8 C   SpO2: 96% 99%    Last Pain:  Vitals:   01/20/24 1655  TempSrc:   PainSc: 5                  Travaris Kosh

## 2024-01-20 NOTE — Progress Notes (Signed)
 Pharmacy Antibiotic Note  Caleb Rogers is a 73 y.o. male admitted on 01/20/2024 with a wound infection.  Pharmacy has been consulted for vanco dosing.  Plan: Vanco 1500 mg iv given in the OR ~1500. Start vanco 1 gram iv q12h on 7/1 ~ 0300. eAUC 442.5, scr 0.87  Height: 5' 10 (177.8 cm) Weight: 90.7 kg (200 lb) IBW/kg (Calculated) : 73  Temp (24hrs), Avg:97.9 F (36.6 C), Min:97.7 F (36.5 C), Max:98 F (36.7 C)  Recent Labs  Lab 01/20/24 1134  WBC 6.1    CrCl cannot be calculated (Patient's most recent lab result is older than the maximum 21 days allowed.).    Allergies  Allergen Reactions   Atorvastatin  Other (See Comments)    leg pains   Other Other (See Comments)    Tree & shrubs      Thank you for allowing pharmacy to be a part of this patient's care.  Benedetta Heath BS, PharmD, BCPS Clinical Pharmacist 01/20/2024 4:27 PM  Contact: [phone] after 3 PM  Be curious, not judgmental... -Davina Sprinkles

## 2024-01-20 NOTE — Discharge Instructions (Signed)

## 2024-01-20 NOTE — Telephone Encounter (Signed)
 Order has been changed

## 2024-01-20 NOTE — Plan of Care (Signed)

## 2024-01-21 ENCOUNTER — Telehealth: Payer: Self-pay | Admitting: Orthopaedic Surgery

## 2024-01-21 ENCOUNTER — Other Ambulatory Visit (HOSPITAL_COMMUNITY): Payer: Self-pay

## 2024-01-21 ENCOUNTER — Other Ambulatory Visit: Payer: Self-pay | Admitting: Physician Assistant

## 2024-01-21 ENCOUNTER — Encounter (HOSPITAL_COMMUNITY): Payer: Self-pay | Admitting: Orthopaedic Surgery

## 2024-01-21 DIAGNOSIS — M71051 Abscess of bursa, right hip: Secondary | ICD-10-CM | POA: Diagnosis not present

## 2024-01-21 LAB — BASIC METABOLIC PANEL WITH GFR
Anion gap: 10 (ref 5–15)
BUN: 18 mg/dL (ref 8–23)
CO2: 25 mmol/L (ref 22–32)
Calcium: 9.1 mg/dL (ref 8.9–10.3)
Chloride: 99 mmol/L (ref 98–111)
Creatinine, Ser: 0.89 mg/dL (ref 0.61–1.24)
GFR, Estimated: >60 mL/min (ref >60)
Glucose, Bld: 182 mg/dL — ABNORMAL HIGH (ref 70–99)
Potassium: 4.3 mmol/L (ref 3.5–5.1)
Sodium: 134 mmol/L — ABNORMAL LOW (ref 135–145)

## 2024-01-21 MED ORDER — PANTOPRAZOLE SODIUM 40 MG PO TBEC
40.0000 mg | DELAYED_RELEASE_TABLET | Freq: Every day | ORAL | Status: DC
  Administered 2024-01-22: 40 mg via ORAL
  Filled 2024-01-21 (×2): qty 1

## 2024-01-21 MED ORDER — HYDROCODONE-ACETAMINOPHEN 5-325 MG PO TABS
1.0000 | ORAL_TABLET | Freq: Four times a day (QID) | ORAL | 0 refills | Status: DC | PRN
  Filled 2024-01-21: qty 40, 7d supply, fill #0

## 2024-01-21 NOTE — Plan of Care (Signed)
  Problem: Education: Goal: Knowledge of General Education information will improve Description: Including pain rating scale, medication(s)/side effects and non-pharmacologic comfort measures Outcome: Progressing   Problem: Clinical Measurements: Goal: Ability to maintain clinical measurements within normal limits will improve Outcome: Progressing   Problem: Activity: Goal: Risk for activity intolerance will decrease Outcome: Progressing   Problem: Nutrition: Goal: Adequate nutrition will be maintained Outcome: Progressing   Problem: Elimination: Goal: Will not experience complications related to bowel motility Outcome: Progressing   

## 2024-01-21 NOTE — Plan of Care (Signed)

## 2024-01-21 NOTE — Telephone Encounter (Signed)
 Patient wife called and said thank you so much for caring for him but her husband is being stubborn about staying in the hospital for two more nights. They ordered for him to stay and he doesn't want to. She ask if you could give him a call. CB#[phone]

## 2024-01-21 NOTE — Progress Notes (Signed)
 Caleb Rogers is doing well overall. Mobilizing well. Cultures are pending. He is to remain on vanc for now. Plan is to discharge home tomorrow after morning dose of vanc with vac, doxy and will tailor abx as outpatient based on culture.

## 2024-01-21 NOTE — TOC Initial Note (Addendum)
 Transition of Care (TOC) - Initial/Assessment Note   Spoke to [employer] PA . Patient will not need home health services.   Spoke to patient at bedside. Patient from home with wife.   Patient already has a walker at home. Discussed 3 in1. He does not believe he will need a 3 in1 at home but will decide after he works with PT. NCM will follow up later today with patient. Patient not discharging today. Will need IV ABX   Patient will need Prevena VAC on day of discharge. Bedside nurse supplies same.   1309 Followed up with patient , he does NOT want 3 in1 ( bedside commode).  Patient Details  Name: Caleb Rogers MRN: [id number] Date of Birth: [birth year]  [facility]) CM/SW Contact:    Stephane Powell Jansky, RN Phone Number: 01/21/2024, 8:32 AM  Clinical Narrative:                   Expected Discharge Plan: Home/Self Care Barriers to Discharge: Continued Medical Work up   Patient Goals and CMS Choice Patient states their goals for this hospitalization and ongoing recovery are:: to return to home   Choice offered to / list presented to : NA      Expected Discharge Plan and Services   Discharge Planning Services: CM Consult Post Acute Care Choice: NA Living arrangements for the past 2 months: Single Family Home                 DME Arranged:  (see note)         HH Arranged:  (see note)          Prior Living Arrangements/Services Living arrangements for the past 2 months: Single Family Home Lives with:: Spouse Patient language and need for interpreter reviewed:: Yes Do you feel safe going back to the place where you live?: Yes      Need for Family Participation in Patient Care: Yes (Comment) Care giver support system in place?: Yes (comment) Current home services: DME Criminal Activity/Legal Involvement Pertinent to Current Situation/Hospitalization: No - Comment as needed  Activities of Daily Living   ADL Screening (condition at time of  admission) Independently performs ADLs?: Yes (appropriate for developmental age) Is the patient deaf or have difficulty hearing?: No Does the patient have difficulty seeing, even when wearing glasses/contacts?: No Does the patient have difficulty concentrating, remembering, or making decisions?: No  Permission Sought/Granted   Permission granted to share information with : No              Emotional Assessment Appearance:: Appears stated age Attitude/Demeanor/Rapport: Engaged Affect (typically observed): Appropriate Orientation: : Oriented to Self, Oriented to Place, Oriented to  Time, Oriented to Situation Alcohol  / Substance Use: Not Applicable Psych Involvement: No (comment)  Admission diagnosis:  Abscess of hip, right [L02.415] Status post total replacement of right hip [Z96.641] Patient Active Problem List   Diagnosis Date Noted   Abscess of right hip 01/17/2024   Open wound of right hip and thigh 09/18/2023   Status post total replacement of right hip 06/20/2022   Primary osteoarthritis of right hip 05/18/2022   Loud snoring 05/11/2021   Hypersomnia with sleep apnea 05/11/2021   CPAP (continuous positive airway pressure) dependence 05/11/2021   OSA on CPAP 09/27/2014   OSA (obstructive sleep apnea) 02/24/2014   Gait instability 09/16/2013   Hypertension 09/16/2013   Hyperlipidemia 09/16/2013   TIA (transient ischemic attack) 09/16/2013   PCP:  Shayne Anes,  MD Pharmacy:   [facility] [address], [place] - [address] [place] [address] Phone: [phone] Fax: [phone]  [id number] [place], [place] - [address] [facility] [address] [place] [address] Phone: [phone] Fax: [phone]  [facility] [address] [place] [address] Phone: [phone] Fax: [phone]     Social Drivers of Health (SDOH) Social  History: SDOH Screenings   Food Insecurity: No Food Insecurity (01/20/2024)  Housing: Low Risk  (01/20/2024)  Transportation Needs: No Transportation Needs (01/20/2024)  Utilities: Not At Risk (01/20/2024)  Social Connections: Socially Integrated (01/20/2024)  Tobacco Use: Medium Risk (01/20/2024)   SDOH Interventions:     Readmission Risk Interventions     No data to display

## 2024-01-21 NOTE — Evaluation (Signed)
 Physical Therapy Evaluation and Discharge Patient Details Name: Caleb Rogers MRN: [id number] DOB: [birth year] Today's Date: 01/21/2024  History of Present Illness  73 y.o. male who presents 01/20/24 for surgical treatment of right hip abscess. I&D with VAC placed. PMH- R THA, HTN, HLD, TIA, OSA on CPAP,  Clinical Impression  Patient evaluated by Physical Therapy with no further acute PT needs identified. All education has been completed and the patient has no further questions. Patient able to ambulate independently (no device) while carrying his VAC machine. Completed 10 steps up/down modified independent with rail.  PT is signing off. Thank you for this referral.         If plan is discharge home, recommend the following:     Can travel by private vehicle        Equipment Recommendations None recommended by PT  Recommendations for Other Services       Functional Status Assessment Patient has not had a recent decline in their functional status     Precautions / Restrictions Precautions Precautions: None Restrictions RLE Weight Bearing Per Provider Order: Weight bearing as tolerated      Mobility  Bed Mobility Overal bed mobility: Independent                  Transfers Overall transfer level: Independent Equipment used: Rolling walker (2 wheels), None                    Ambulation/Gait Ambulation/Gait assistance: Supervision, Independent Gait Distance (Feet): 200 Feet Assistive device: Rolling walker (2 wheels), None Gait Pattern/deviations: Step-through pattern, Decreased stride length, Antalgic   Gait velocity interpretation: 1.31 - 2.62 ft/sec, indicative of limited community ambulator   General Gait Details: pt initially using RW and then wanted to walk without (and carry his VAC!). Slightly antalgic  Stairs Stairs: Yes Stairs assistance: Modified independent (Device/Increase time) Stair Management: One rail Left, Step to pattern,  Forwards Number of Stairs: 10 General stair comments: pt questioning which leg to lead with as ascend/descend; able to carryover without further cues  Wheelchair Mobility     Tilt Bed    Modified Rankin (Stroke Patients Only)       Balance Overall balance assessment: Independent                                           Pertinent Vitals/Pain      Home Living Family/patient expects to be discharged to:: Private residence Living Arrangements: Spouse/significant other Available Help at Discharge: Family;Friend(s);Available 24 hours/day Type of Home: House Home Access: Stairs to enter Entrance Stairs-Rails: Doctor, general practice of Steps: 5   Home Layout: Two level;Laundry or work area in basement;Able to live on main level with bedroom/bathroom Home Equipment: Agricultural consultant (2 wheels);Cane - single point      Prior Function Prior Level of Function : Independent/Modified Independent                     Extremity/Trunk Assessment   Upper Extremity Assessment Upper Extremity Assessment: Overall WFL for tasks assessed    Lower Extremity Assessment Lower Extremity Assessment: Overall WFL for tasks assessed (some pain/tingling at incision site RLE)    Cervical / Trunk Assessment Cervical / Trunk Assessment: Normal  Communication   Communication Communication: No apparent difficulties    Cognition Arousal: Alert Behavior During Therapy: [facility] for tasks assessed/performed  PT - Cognitive impairments: No apparent impairments                         Following commands: Intact       Cueing Cueing Techniques: Verbal cues     General Comments      Exercises     Assessment/Plan    PT Assessment Patient does not need any further PT services  PT Problem List         PT Treatment Interventions      PT Goals (Current goals can be found in the Care Plan section)  Acute Rehab PT Goals Patient Stated Goal: go  home today PT Goal Formulation: All assessment and education complete, DC therapy    Frequency       Co-evaluation               AM-PAC PT 6 Clicks Mobility  Outcome Measure Help needed turning from your back to your side while in a flat bed without using bedrails?: None Help needed moving from lying on your back to sitting on the side of a flat bed without using bedrails?: None Help needed moving to and from a bed to a chair (including a wheelchair)?: None Help needed standing up from a chair using your arms (e.g., wheelchair or bedside chair)?: None Help needed to walk in hospital room?: None Help needed climbing 3-5 steps with a railing? : None 6 Click Score: 24    End of Session   Activity Tolerance: Patient tolerated treatment well Patient left: in chair;with call bell/phone within reach Nurse Communication: Mobility status;Other (comment) (able to walk i'ly including plug/unplug VAC and carry it with him) PT Visit Diagnosis: Difficulty in walking, not elsewhere classified (R26.2)    Time: 9151-9087 PT Time Calculation (min) (ACUTE ONLY): 24 min   Charges:   PT Evaluation $PT Eval Low Complexity: 1 Low PT Treatments $Gait Training: 8-22 mins PT General Charges $$ ACUTE PT VISIT: 1 Visit          [name], PT Acute Rehabilitation Services  Office [phone]   [name] 01/21/2024, 9:19 AM

## 2024-01-21 NOTE — Progress Notes (Addendum)
 Subjective: 1 Day Post-Op Procedure(s) (LRB): IRRIGATION AND DEBRIDEMENT HIP (Right) APPLICATION, WOUND VAC, RIGHT HIP Patient reports pain as mild.    Objective: Vital signs in last 24 hours: Temp:  [97.4 F (36.3 C)-98.2 F (36.8 C)] 97.6 F (36.4 C) (07/01 0409) Pulse Rate:  [51-63] 62 (07/01 0758) Resp:  [8-18] 17 (07/01 0758) BP: (116-143)/(45-80) 117/73 (07/01 0758) SpO2:  [93 %-99 %] 95 % (07/01 0758) Weight:  [90.7 kg] 90.7 kg (06/30 1138)  Intake/Output from previous day: 06/30 0701 - 07/01 0700 In: 2490.1 [P.O.:690; I.V.:900; IV Piggyback:900.1] Out: 1195 [Urine:1175; Blood:20] Intake/Output this shift: No intake/output data recorded.  Recent Labs    01/20/24 1134  HGB 12.9*   Recent Labs    01/20/24 1134  WBC 6.1  RBC 4.36  HCT 38.3*  PLT 343   Recent Labs    01/21/24 0630  NA 134*  K 4.3  CL 99  CO2 25  BUN 18  CREATININE 0.89  GLUCOSE 182*  CALCIUM  9.1   No results for input(s): LABPT, INR in the last 72 hours.  Neurologically intact Neurovascular intact Sensation intact distally Intact pulses distally Dorsiflexion/Plantar flexion intact Incision: dressing C/D/I No cellulitis present Compartment soft Ivac in place with good seal.  No fluid in canister   Assessment/Plan: 1 Day Post-Op Procedure(s) (LRB): IRRIGATION AND DEBRIDEMENT HIP (Right) APPLICATION, WOUND VAC, RIGHT HIP Up with therapy WBAT RLE Continue ivac- please swap out hospital unit with portable prevena DAY OF DISCHARGE.   Continue IV abx Intra-op cx ngtd Will plan to keep inpatient for a few days to receive iv abx      Ronal LITTIE Grave 01/21/2024, 8:20 AM

## 2024-01-21 NOTE — Telephone Encounter (Signed)
 I will go see him now.

## 2024-01-22 ENCOUNTER — Other Ambulatory Visit (HOSPITAL_COMMUNITY): Payer: Self-pay

## 2024-01-22 DIAGNOSIS — M71051 Abscess of bursa, right hip: Secondary | ICD-10-CM | POA: Diagnosis not present

## 2024-01-22 NOTE — Progress Notes (Signed)
 Subjective: 2 Days Post-Op Procedure(s) (LRB): IRRIGATION AND DEBRIDEMENT HIP (Right) APPLICATION, WOUND VAC, RIGHT HIP Patient reports pain as mild.    Objective: Vital signs in last 24 hours: Temp:  [97.7 F (36.5 C)-97.9 F (36.6 C)] 97.7 F (36.5 C) (07/02 0758) Pulse Rate:  [51-59] 51 (07/02 0758) Resp:  [17-18] 18 (07/02 0758) BP: (117-139)/(62-73) 129/71 (07/02 0758) SpO2:  [96 %-99 %] 97 % (07/02 0758)  Intake/Output from previous day: 07/01 0701 - 07/02 0700 In: 1300 [P.O.:900; IV Piggyback:400] Out: 1026 [Urine:1026] Intake/Output this shift: Total I/O In: -  Out: 400 [Urine:400]  Recent Labs    01/20/24 1134  HGB 12.9*   Recent Labs    01/20/24 1134  WBC 6.1  RBC 4.36  HCT 38.3*  PLT 343   Recent Labs    01/21/24 0630  NA 134*  K 4.3  CL 99  CO2 25  BUN 18  CREATININE 0.89  GLUCOSE 182*  CALCIUM  9.1   No results for input(s): LABPT, INR in the last 72 hours.  Neurologically intact Neurovascular intact Sensation intact distally Intact pulses distally Dorsiflexion/Plantar flexion intact Incision: dressing C/D/I No cellulitis present Compartment soft Ivac in place with good seal.  No fluid in canister   Assessment/Plan: 2 Days Post-Op Procedure(s) (LRB): IRRIGATION AND DEBRIDEMENT HIP (Right) APPLICATION, WOUND VAC, RIGHT HIP Advance diet Up with therapy D/C IV fluids WBAT RLE Continue ivac- please swap out hospital unit for prevena Intra-op cx ngtd Had 3 am dose of vancomycin  so ok for discharge Will continue with po doxy.  Has rx at home     Ronal LITTIE Grave 01/22/2024, 8:25 AM

## 2024-01-22 NOTE — Discharge Summary (Signed)
 Patient ID: Caleb Rogers MRN: [id number] DOB/AGE: 1950-12-02 73 y.o.  Admit date: 01/20/2024 Discharge date: 01/22/2024  Admission Diagnoses:  Principal Problem:   Status post total replacement of right hip   Discharge Diagnoses:  Same  Past Medical History:  Diagnosis Date   Gait instability 08/23/2013   GERD (gastroesophageal reflux disease)    Hypertension    Impaired fasting glucose    OSA (obstructive sleep apnea) 02/24/2014   Pure hypercholesterolemia    Skin cancer of face    not melanoma   Sleep apnea    TIA (transient ischemic attack)    2015    Surgeries: Procedure(s): IRRIGATION AND DEBRIDEMENT HIP APPLICATION, WOUND VAC, RIGHT HIP on 01/20/2024   Consultants:   Discharged Condition: Improved  Hospital Course: Caleb Rogers is an 73 y.o. male who was admitted 01/20/2024 for operative treatment ofStatus post total replacement of right hip. Patient has severe unremitting pain that affects sleep, daily activities, and work/hobbies. After pre-op clearance the patient was taken to the operating room on 01/20/2024 and underwent  Procedure(s): IRRIGATION AND DEBRIDEMENT HIP APPLICATION, WOUND VAC, RIGHT HIP.    Patient was given perioperative antibiotics:  Anti-infectives (From admission, onward)    Start     Dose/Rate Route Frequency Ordered Stop   01/21/24 0300  vancomycin  (VANCOCIN ) IVPB 1000 mg/200 mL premix        1,000 mg 200 mL/hr over 60 Minutes Intravenous Every 12 hours 01/20/24 1626     01/20/24 2200  doxycycline  (VIBRA -TABS) tablet 100 mg        100 mg Oral 2 times daily 01/20/24 2002     01/20/24 1800  ceFAZolin  (ANCEF ) IVPB 2g/100 mL premix  Status:  Discontinued        2 g 200 mL/hr over 30 Minutes Intravenous Every 6 hours 01/20/24 1714 01/20/24 1924   01/20/24 1515  vancomycin  (VANCOREADY) IVPB 1500 mg/300 mL  Status:  Discontinued        1,500 mg 150 mL/hr over 120 Minutes Intravenous 60 min pre-op 01/20/24 1507 01/20/24 1714   01/20/24 1456   vancomycin  (VANCOCIN ) powder  Status:  Discontinued          As needed 01/20/24 1456 01/20/24 1555        Patient was given sequential compression devices, early ambulation, and chemoprophylaxis to prevent DVT.  Patient benefited maximally from hospital stay and there were no complications.    Recent vital signs: Patient Vitals for the past 24 hrs:  BP Temp Temp src Pulse Resp SpO2  01/22/24 0758 129/71 97.7 F (36.5 C) Oral (!) 51 18 97 %  01/22/24 0559 138/62 97.7 F (36.5 C) Oral (!) 53 17 97 %  01/21/24 2139 128/68 97.9 F (36.6 C) Oral (!) 56 17 96 %  01/21/24 1613 139/67 -- -- (!) 57 17 99 %  01/21/24 1209 124/73 -- -- (!) 59 -- 98 %  01/21/24 1036 117/73 -- -- -- -- --     Recent laboratory studies:  Recent Labs    01/20/24 1134 01/21/24 0630  WBC 6.1  --   HGB 12.9*  --   HCT 38.3*  --   PLT 343  --   NA  --  134*  K  --  4.3  CL  --  99  CO2  --  25  BUN  --  18  CREATININE  --  0.89  GLUCOSE  --  182*  CALCIUM   --  9.1     Discharge  Medications:   Allergies as of 01/22/2024       Reactions   Atorvastatin  Other (See Comments)   leg pains   Other Other (See Comments)   Tree & shrubs        Medication List     STOP taking these medications    acetaminophen  500 MG tablet Commonly known as: TYLENOL    ibuprofen 200 MG tablet Commonly known as: ADVIL       TAKE these medications    amLODipine  5 MG tablet Commonly known as: NORVASC  Take 1 tablet (5 mg total) by mouth daily.   aspirin  EC 81 MG tablet Take 81 mg by mouth daily. Swallow whole.   b complex vitamins capsule Take 1 capsule by mouth daily.   BEET ROOT PO Take 1 tablet by mouth daily.   chlorhexidine  4 % external liquid Commonly known as: HIBICLENS  Apply 15 mLs topically as directed for 30 doses.  Use as directed daily for 5 days every other week for 6 weeks   doxycycline  100 MG tablet Commonly known as: VIBRA -TABS Take 1 tablet (100 mg total) by mouth 2 (two) times  daily.   ezetimibe  10 MG tablet Commonly known as: Zetia  Take 1 tablet (10 mg total) by mouth daily.   ferrous sulfate  325 (65 FE) MG tablet Take 325 mg by mouth daily.   HYDROcodone -acetaminophen  5-325 MG tablet Commonly known as: NORCO/VICODIN Take 1-2 tablets by mouth every 6 (six) hours as needed for moderate pain (pain score 4-6).   methocarbamol  500 MG tablet Commonly known as: ROBAXIN  Take 1 tablet (500 mg total) by mouth 2 (two) times daily as needed.   mupirocin  ointment 2 % Commonly known as: BACTROBAN  Place one application into the nose twice daily for 60 doses.  Use as directed 2 times daily for 5 days every other week for 6 weeks.   omeprazole 20 MG tablet Commonly known as: PRILOSEC OTC Take 20 mg by mouth daily.   ondansetron  4 MG tablet Commonly known as: Zofran  Take 1 tablet (4 mg total) by mouth every 8 (eight) hours as needed for nausea or vomiting.   OVER THE COUNTER MEDICATION Take 1 capsule by mouth daily. NAD supplement   OVER THE COUNTER MEDICATION Take 1 tablet by mouth daily. Brain defender supplement   Repatha SureClick 140 MG/ML Soaj Generic drug: Evolocumab Inject 140 mg into the skin every 14 (fourteen) days.   valsartan -hydrochlorothiazide  160-25 MG tablet Commonly known as: DIOVAN -HCT Take 1 tablet by mouth daily.               Durable Medical Equipment  (From admission, onward)           Start     Ordered   01/20/24 1715  DME Walker rolling  Once       Question:  Patient needs a walker to treat with the following condition  Answer:  History of open reduction and internal fixation (ORIF) procedure   01/20/24 1714   01/20/24 1715  DME 3 n 1  Once        01/20/24 1714   01/20/24 1715  DME Bedside commode  Once       Question:  Patient needs a bedside commode to treat with the following condition  Answer:  History of open reduction and internal fixation (ORIF) procedure   01/20/24 1714            Diagnostic  Studies: XR HIP UNILAT W OR W/O PELVIS 2-3 VIEWS RIGHT Result  Date: 01/17/2024 Stable right total hip replacement without complication.   Disposition: Discharge disposition: 01-Home or Self Care          Follow-up Information     Jule Ronal CROME, PA-C. Schedule an appointment as soon as possible for a visit in 1 week(s).   Specialty: Orthopedic Surgery Contact information: [address] [place] [address] [phone]                  Signed: Ronal CROME Jule 01/22/2024, 8:26 AM

## 2024-01-22 NOTE — Plan of Care (Signed)
  Problem: Clinical Measurements: Goal: Ability to maintain clinical measurements within normal limits will improve Outcome: Progressing   Problem: Activity: Goal: Risk for activity intolerance will decrease Outcome: Progressing   Problem: Coping: Goal: Level of anxiety will decrease Outcome: Progressing   Problem: Pain Managment: Goal: General experience of comfort will improve and/or be controlled Outcome: Progressing

## 2024-01-22 NOTE — Progress Notes (Signed)
 AVS given and explained, all am meds given to pt as well as tylenol . Pt will call me when he is dressed and ready to go to DC lounge(presently eating bfast), wife will pick up from there.  Pt needs to go by TOC to pick up meds. Pt has Prevena on and operating and has all equipment at home from previous surgeries.

## 2024-01-23 ENCOUNTER — Other Ambulatory Visit: Payer: Self-pay

## 2024-01-23 ENCOUNTER — Ambulatory Visit: Payer: Self-pay | Admitting: Orthopaedic Surgery

## 2024-01-23 DIAGNOSIS — L02415 Cutaneous abscess of right lower limb: Secondary | ICD-10-CM

## 2024-01-23 DIAGNOSIS — M1611 Unilateral primary osteoarthritis, right hip: Secondary | ICD-10-CM

## 2024-01-23 NOTE — Progress Notes (Signed)
 Please place referral to [facility] for right prosthetic hip infection.  Thank you.  I'll call him today

## 2024-01-25 LAB — AEROBIC/ANAEROBIC CULTURE W GRAM STAIN (SURGICAL/DEEP WOUND)
Culture: NO GROWTH
Culture: NO GROWTH
Gram Stain: NONE SEEN
Gram Stain: NONE SEEN
Gram Stain: NONE SEEN

## 2024-01-26 ENCOUNTER — Encounter: Payer: Self-pay | Admitting: Cardiology

## 2024-01-26 ENCOUNTER — Encounter: Payer: Self-pay | Admitting: Orthopaedic Surgery

## 2024-01-27 ENCOUNTER — Telehealth (HOSPITAL_COMMUNITY): Payer: Self-pay | Admitting: Cardiology

## 2024-01-27 NOTE — Telephone Encounter (Signed)
 Patient cancelled myoview  for the reason below:  01/27/2024 9:29 AM Ab:[id number], STEPHANIE N  Cancel Rsn: Patient (Patient requested cancel.)   Order will be removed from the [facility]. If patient calls back we will reinstate the order. Thank you.

## 2024-01-27 NOTE — Telephone Encounter (Signed)
 Nicklaus, Alviar - 01/27/2024 11:08 AM Ladona Heinz, MD  Sent: Mon January 27, 2024  2:02 PM  To: Gladis Porter HERO, LPN         Message  I am fine to cancel for now and wait. He just went through hip surgery also and did well peri operatively    Will send this as an FYI to [employer].

## 2024-01-27 NOTE — Telephone Encounter (Signed)
 Will send this message to the ordering MD and covering RN, as a general FYI.

## 2024-01-30 ENCOUNTER — Inpatient Hospital Stay (HOSPITAL_COMMUNITY): Admission: RE | Admit: 2024-01-30 | Source: Ambulatory Visit | Attending: Cardiology | Admitting: Cardiology

## 2024-01-31 ENCOUNTER — Other Ambulatory Visit: Payer: Self-pay

## 2024-01-31 MED ORDER — DOXYCYCLINE HYCLATE 100 MG PO TABS: 100.0000 mg | ORAL_TABLET | Freq: Two times a day (BID) | ORAL | 0 refills | Status: DC

## 2024-01-31 NOTE — Telephone Encounter (Signed)
 He doesn't need ID right now since he's on doxy.

## 2024-01-31 NOTE — Telephone Encounter (Signed)
 He should come in to remove VAC and place aquacel.  Can you extend his doxycycline  for another month please.  Thank you.

## 2024-02-04 ENCOUNTER — Telehealth: Payer: Self-pay | Admitting: Radiology

## 2024-02-04 ENCOUNTER — Ambulatory Visit: Admitting: Orthopaedic Surgery

## 2024-02-04 NOTE — Telephone Encounter (Signed)
 Gordy from [facility] called requesting aspiration results taken on 6/30. Patient has been referred to office by Dr. Jerri.   I have faxed results to [phone] and emailed results to [email or web address]

## 2024-02-05 NOTE — Telephone Encounter (Signed)
 Can you have him come in sometime tomorrow for nurse visit to get an aquacel

## 2024-02-06 ENCOUNTER — Ambulatory Visit

## 2024-02-06 NOTE — Progress Notes (Signed)
 Patient came in this morning. Removed bandage and applied ABD pad and Mupirocin  per Dr.Xu. Sent picture of wound to Dr.Xu as well, as he is in the OR today. Patient to continue with Doxy BID. Patient has appointment with surgeon in [place] in September.

## 2024-02-11 NOTE — Telephone Encounter (Signed)
 See message.

## 2024-02-12 ENCOUNTER — Ambulatory Visit (INDEPENDENT_AMBULATORY_CARE_PROVIDER_SITE_OTHER): Admitting: Physician Assistant

## 2024-02-12 DIAGNOSIS — S71101D Unspecified open wound, right thigh, subsequent encounter: Secondary | ICD-10-CM

## 2024-02-12 DIAGNOSIS — S71001D Unspecified open wound, right hip, subsequent encounter: Secondary | ICD-10-CM

## 2024-02-12 NOTE — Progress Notes (Signed)
 Post-Op Visit Note   Patient: Caleb Rogers           Date of Birth: 10-22-1950           MRN: [id number] Visit Date: 02/12/2024 PCP: Shayne Anes, MD   Assessment & Plan:  Chief Complaint:  Chief Complaint  Patient presents with   Right Hip - Follow-up    Right hip I&D 01/20/2024   Visit Diagnoses:  1. Open wound of right hip and thigh, subsequent encounter     Plan: Patient is a pleasant 73 year old gentleman who comes in today approximately 3 weeks status post right hip I&D from a prosthetic hip infection, date of surgery 01/20/2024.  Intraoperative cultures grew out MRSA.  He is on doxycycline  twice daily and has been compliant with this.  He is still in some pain and takes Norco at night.  He is not scheduled to see [facility] until September due to his insurance.  Examination of the right hip reveals a well-approximated surgical incision with nylon sutures in place.  He does have surrounding erythema distal to the incision.  This area is tender but soft.  No drainage.  He is neurovascularly intact distally.  Today, sutures were removed.  He will continue taking his doxycycline  twice daily.  He will come back and see us  in 2 weeks for recheck.  Call with concerns or questions.  Follow-Up Instructions: Return in about 2 weeks (around 02/26/2024).   Orders:  No orders of the defined types were placed in this encounter.  No orders of the defined types were placed in this encounter.   Imaging: No results found.  PMFS History: Patient Active Problem List   Diagnosis Date Noted   Abscess of right hip 01/17/2024   Open wound of right hip and thigh 09/18/2023   Status post total replacement of right hip 06/20/2022   Primary osteoarthritis of right hip 05/18/2022   Loud snoring 05/11/2021   Hypersomnia with sleep apnea 05/11/2021   CPAP (continuous positive airway pressure) dependence 05/11/2021   OSA on CPAP 09/27/2014   OSA (obstructive sleep apnea) 02/24/2014   Gait  instability 09/16/2013   Hypertension 09/16/2013   Hyperlipidemia 09/16/2013   TIA (transient ischemic attack) 09/16/2013   Past Medical History:  Diagnosis Date   Gait instability 08/23/2013   GERD (gastroesophageal reflux disease)    Hypertension    Impaired fasting glucose    OSA (obstructive sleep apnea) 02/24/2014   Pure hypercholesterolemia    Skin cancer of face    not melanoma   Sleep apnea    TIA (transient ischemic attack)    2015    Family History  Problem Relation Age of Onset   Breast cancer Mother        after age 58   Congestive Heart Failure Mother    Coronary artery disease Father    Coronary artery disease Brother 73    Past Surgical History:  Procedure Laterality Date   APPLICATION OF WOUND VAC  01/20/2024   Procedure: APPLICATION, WOUND VAC, RIGHT HIP;  Surgeon: Jerri Kay HERO, MD;  Location: [facility] OR;  Service: Orthopedics;;   COLONOSCOPY     DEBRIDEMENT AND CLOSURE WOUND Right 09/18/2023   Procedure: IRRIGATION AND DEBRIDEMEN RIGHT HIP AND CLOSURE WOUND;  Surgeon: Jerri Kay HERO, MD;  Location: [facility];  Service: Orthopedics;  Laterality: Right;   INCISION AND DRAINAGE HIP Right 01/20/2024   Procedure: IRRIGATION AND DEBRIDEMENT HIP;  Surgeon: Jerri Kay HERO, MD;  Location: [facility] OR;  Service: Orthopedics;  Laterality: Right;  RIGHT HIP I & D   none     TEE WITHOUT CARDIOVERSION N/A 11/11/2014   Procedure: TRANSESOPHAGEAL ECHOCARDIOGRAM (TEE);  Surgeon: Gordy Bergamo, MD;  Location: [facility] ENDOSCOPY;  Service: Cardiovascular;  Laterality: N/A;  h/p in file drawer   TOTAL HIP ARTHROPLASTY Right 06/20/2022   Procedure: RIGHT TOTAL HIP ARTHROPLASTY ANTERIOR APPROACH;  Surgeon: Jerri Kay HERO, MD;  Location: [facility] OR;  Service: Orthopedics;  Laterality: Right;  3-C   Social History   Occupational History   Occupation: Chief Strategy Officer: Production assistant, radio FOR SELF EMPLOYED  Tobacco Use   Smoking status: Former    Current packs/day: 0.00    Types: Cigarettes     Start date: 07/24/1963    Quit date: 07/23/1968    Years since quitting: 55.5   Smokeless tobacco: Never   Tobacco comments:    pt quit July 1997  Vaping Use   Vaping status: Never Used  Substance and Sexual Activity   Alcohol  use: Yes    Comment: 2-3days a week- 1 beer   Drug use: No   Sexual activity: Yes    Partners: Female

## 2024-03-02 DIAGNOSIS — R972 Elevated prostate specific antigen [PSA]: Secondary | ICD-10-CM | POA: Diagnosis not present

## 2024-03-09 ENCOUNTER — Telehealth: Payer: Self-pay

## 2024-03-09 ENCOUNTER — Other Ambulatory Visit: Payer: Self-pay | Admitting: Surgical

## 2024-03-09 DIAGNOSIS — R972 Elevated prostate specific antigen [PSA]: Secondary | ICD-10-CM | POA: Diagnosis not present

## 2024-03-09 MED ORDER — HYDROCODONE-ACETAMINOPHEN 5-325 MG PO TABS
1.0000 | ORAL_TABLET | Freq: Four times a day (QID) | ORAL | 0 refills | Status: DC | PRN
Start: 2024-03-09 — End: 2024-04-09

## 2024-03-09 MED ORDER — DOXYCYCLINE HYCLATE 100 MG PO TABS: 100.0000 mg | ORAL_TABLET | Freq: Two times a day (BID) | ORAL | 0 refills | Status: DC

## 2024-03-09 NOTE — Telephone Encounter (Signed)
   Pre-operative Risk Assessment    Patient Name: Caleb Rogers  DOB: 08-17-1950 MRN: [id number]   Date of last office visit: 01/16/24 GORDY BERGAMO, MD Date of next office visit: NONE   Request for Surgical Clearance    Procedure:  BIOPSY  Date of Surgery:  Clearance TBD                                Surgeon:  DR SHANE Surgeon's Group or Practice Name:  [facility] Phone number:  [phone] Fax number:  [phone]   Type of Clearance Requested:   - Medical  - Pharmacy:  Hold Aspirin  5 DAYS PRIOR   Type of Anesthesia:  Not Indicated   Additional requests/questions:    Signed, Lucie DELENA Ku   03/09/2024, 4:06 PM

## 2024-03-09 NOTE — Telephone Encounter (Signed)
 I sent in a months worth of doxycycline  and refill of the Norco

## 2024-03-09 NOTE — Telephone Encounter (Signed)
 Caleb Rogers, would you be wiling to refill these meds for this patient? Dr.Xu and Morna are both on vacation.

## 2024-03-10 NOTE — Telephone Encounter (Signed)
   Name: Caleb Rogers  DOB: 26-Aug-1950  MRN: [id number]  Primary Cardiologist: Gordy Bergamo, MD   Preoperative team, please contact this patient and set up a phone call appointment for further preoperative risk assessment. Please obtain consent and complete medication review. Thank you for your help.  I confirm that guidance regarding antiplatelet and oral anticoagulation therapy has been completed and, if necessary, noted below.  He may hold aspirin  for 5-7 days prior to procedure. Please resume aspirin  as soon as possible postprocedure, at the discretion of the surgeon.    I also confirmed the patient resides in the state of [place] . As per [facility] Medical Board telemedicine laws, the patient must reside in the state in which the provider is licensed.   Lum LITTIE Louis, NP 03/10/2024, 1:28 PM [facility]

## 2024-03-10 NOTE — Telephone Encounter (Signed)
 S/w [facility] and procedure will be:   Prostate Bx

## 2024-03-19 ENCOUNTER — Telehealth: Payer: Self-pay

## 2024-03-19 NOTE — Telephone Encounter (Signed)
 Ariel with [facility] is requesting updates on clearance. Please advise.  Phone#: [phone]  (ext#: [phone] if call is returned today) (ext#: [phone] if call is not returned today)  Fax#: [phone]

## 2024-03-19 NOTE — Telephone Encounter (Signed)
Patient has been scheduled for televisit.

## 2024-03-19 NOTE — Telephone Encounter (Signed)
 Patient has been scheduled for televisit med rec and consent done     Patient Consent for Virtual Visit         Caleb Rogers has provided verbal consent on 03/19/2024 for a virtual visit (video or telephone).   CONSENT FOR VIRTUAL VISIT FOR:  Caleb Rogers  By participating in this virtual visit I agree to the following:  I hereby voluntarily request, consent and authorize [facility] and its employed or contracted physicians, physician assistants, nurse practitioners or other licensed health care professionals (the Practitioner), to provide me with telemedicine health care services (the "Services) as deemed necessary by the treating Practitioner. I acknowledge and consent to receive the Services by the Practitioner via telemedicine. I understand that the telemedicine visit will involve communicating with the Practitioner through live audiovisual communication technology and the disclosure of certain medical information by electronic transmission. I acknowledge that I have been given the opportunity to request an in-person assessment or other available alternative prior to the telemedicine visit and am voluntarily participating in the telemedicine visit.  I understand that I have the right to withhold or withdraw my consent to the use of telemedicine in the course of my care at any time, without affecting my right to future care or treatment, and that the Practitioner or I may terminate the telemedicine visit at any time. I understand that I have the right to inspect all information obtained and/or recorded in the course of the telemedicine visit and may receive copies of available information for a reasonable fee.  I understand that some of the potential risks of receiving the Services via telemedicine include:  Delay or interruption in medical evaluation due to technological equipment failure or disruption; Information transmitted may not be sufficient (e.g. poor resolution of images)  to allow for appropriate medical decision making by the Practitioner; and/or  In rare instances, security protocols could fail, causing a breach of personal health information.  Furthermore, I acknowledge that it is my responsibility to provide information about my medical history, conditions and care that is complete and accurate to the best of my ability. I acknowledge that Practitioner's advice, recommendations, and/or decision may be based on factors not within their control, such as incomplete or inaccurate data provided by me or distortions of diagnostic images or specimens that may result from electronic transmissions. I understand that the practice of medicine is not an exact science and that Practitioner makes no warranties or guarantees regarding treatment outcomes. I acknowledge that a copy of this consent can be made available to me via my patient portal [facility] MyChart), or I can request a printed copy by calling the office of [facility].    I understand that my insurance will be billed for this visit.   I have read or had this consent read to me. I understand the contents of this consent, which adequately explains the benefits and risks of the Services being provided via telemedicine.  I have been provided ample opportunity to ask questions regarding this consent and the Services and have had my questions answered to my satisfaction. I give my informed consent for the services to be provided through the use of telemedicine in my medical care

## 2024-03-27 ENCOUNTER — Ambulatory Visit: Attending: Internal Medicine | Admitting: Student

## 2024-03-27 DIAGNOSIS — Z01810 Encounter for preprocedural cardiovascular examination: Secondary | ICD-10-CM | POA: Diagnosis not present

## 2024-03-27 NOTE — Progress Notes (Signed)
 Virtual Visit via Telephone Note   Because of Caleb Rogers's co-morbid illnesses, he is at least at moderate risk for complications without adequate follow up.  This format is felt to be most appropriate for this patient at this time.  The patient did not have access to video technology/had technical difficulties with video requiring transitioning to audio format only (telephone).  All issues noted in this document were discussed and addressed.  No physical exam could be performed with this format.  Please refer to the patient's chart for his consent to telehealth for [facility].  Evaluation Performed:  Preoperative cardiovascular risk assessment _____________   Date:  03/27/2024   Patient ID:  Caleb Rogers, DOB 1951/04/16, MRN [id number] Patient Location:  Home Provider location:   Office  Primary Care Provider:  Shayne Anes, MD Primary Cardiologist:  Gordy Bergamo, MD  Chief Complaint / Patient Profile   73 y.o. y/o male with a h/o CAD, hypertension, hyperlipidemia, TIA, OSA on CPAP, asbestos lung disease who is pending biopsy by Dr. Shane in urology and presents today for telephonic preoperative cardiovascular risk assessment.  History of Present Illness    Caleb Rogers is a 73 y.o. male who presents via audio/video conferencing for a telehealth visit today.  Pt was last seen in cardiology clinic on 01/16/2024 by Dr. Bergamo.  At that time Caleb Rogers complained of worsening shortness of breath and fatigue.  A stress test was ordered but not completed, as patient underwent thigh surgery.  The patient is now pending procedure as outlined above. Since his last visit, he is doing well. Patient denies lower extremity edema, orthopnea or PND. No chest pain, pressure, or tightness. No palpitations. He reports shortness of breath has mostly resolved. He does 2 high intensity cardio workouts a week with no limitations. He will sometimes get winded but it resolves quickly. He also  does strength training without limitation. Per Dr. Ganji, patient may proceed with biopsy without undergoing stress testing.   Past Medical History    Past Medical History:  Diagnosis Date   Gait instability 08/23/2013   GERD (gastroesophageal reflux disease)    Hypertension    Impaired fasting glucose    OSA (obstructive sleep apnea) 02/24/2014   Pure hypercholesterolemia    Skin cancer of face    not melanoma   Sleep apnea    TIA (transient ischemic attack)    2015   Past Surgical History:  Procedure Laterality Date   APPLICATION OF WOUND VAC  01/20/2024   Procedure: APPLICATION, WOUND VAC, RIGHT HIP;  Surgeon: Caleb Kay HERO, MD;  Location: [facility] OR;  Service: Orthopedics;;   COLONOSCOPY     DEBRIDEMENT AND CLOSURE WOUND Right 09/18/2023   Procedure: IRRIGATION AND DEBRIDEMEN RIGHT HIP AND CLOSURE WOUND;  Surgeon: Caleb Kay HERO, MD;  Location: [facility];  Service: Orthopedics;  Laterality: Right;   INCISION AND DRAINAGE HIP Right 01/20/2024   Procedure: IRRIGATION AND DEBRIDEMENT HIP;  Surgeon: Caleb Kay HERO, MD;  Location: [facility] OR;  Service: Orthopedics;  Laterality: Right;  RIGHT HIP I & D   none     TEE WITHOUT CARDIOVERSION N/A 11/11/2014   Procedure: TRANSESOPHAGEAL ECHOCARDIOGRAM (TEE);  Surgeon: Gordy Bergamo, MD;  [facility] ENDOSCOPY;  Service: Cardiovascular;  Laterality: N/A;  h/p in file drawer   TOTAL HIP ARTHROPLASTY Right 06/20/2022   Procedure: RIGHT TOTAL HIP ARTHROPLASTY ANTERIOR APPROACH;  Surgeon: Caleb Kay HERO, MD;  Location: [facility] OR;  Service: Orthopedics;  Laterality:  Right;  3-C    Allergies  Allergies  Allergen Reactions   Atorvastatin  Other (See Comments)    leg pains   Other Other (See Comments)    Tree & shrubs    Home Medications    Prior to Admission medications   Medication Sig Start Date End Date Taking? Authorizing Provider  amLODipine  (NORVASC ) 5 MG tablet Take 1 tablet (5 mg total) by mouth daily. 01/16/24 04/15/24  Ladona Heinz, MD   aspirin  EC 81 MG tablet Take 81 mg by mouth daily. Swallow whole.    [provider]  b complex vitamins capsule Take 1 capsule by mouth daily.    [provider]  chlorhexidine  (HIBICLENS ) 4 % external liquid Apply 15 mLs topically as directed for 30 doses.  Use as directed daily for 5 days every other week for 6 weeks 01/17/24   Jule Ronal CROME, PA-C  doxycycline  (VIBRA -TABS) 100 MG tablet Take 1 tablet (100 mg total) by mouth 2 (two) times daily. 03/09/24   Magnant, Charles L, PA-C  ezetimibe  (ZETIA ) 10 MG tablet Take 1 tablet (10 mg total) by mouth daily. 05/11/21   Dohmeier, Dedra, MD  ferrous sulfate  325 (65 FE) MG tablet Take 325 mg by mouth daily.    [provider]  HYDROcodone -acetaminophen  (NORCO/VICODIN) 5-325 MG tablet Take 1 tablet by mouth every 6 (six) hours as needed for moderate pain (pain score 4-6). 03/09/24   Magnant, Charles L, PA-C  methocarbamol  (ROBAXIN ) 500 MG tablet Take 1 tablet (500 mg total) by mouth 2 (two) times daily as needed. 01/20/24   Jule Ronal CROME, PA-C  Misc Natural Products (BEET ROOT PO) Take 1 tablet by mouth daily.    [provider]  mupirocin  ointment (BACTROBAN ) 2 % Place one application into the nose twice daily for 60 doses.  Use as directed 2 times daily for 5 days every other week for 6 weeks. 01/17/24   Jule Ronal CROME, PA-C  omeprazole (PRILOSEC OTC) 20 MG tablet Take 20 mg by mouth daily.    [provider]  ondansetron  (ZOFRAN ) 4 MG tablet Take 1 tablet (4 mg total) by mouth every 8 (eight) hours as needed for nausea or vomiting. 01/20/24   Jule Ronal CROME, PA-C  OVER THE COUNTER MEDICATION Take 1 capsule by mouth daily. NAD supplement    [provider]  OVER THE COUNTER MEDICATION Take 1 tablet by mouth daily. Brain Architectural technologist    [provider]  REPATHA SURECLICK 140 MG/ML SOAJ Inject 140 mg into the skin every 14 (fourteen) days. 05/05/21   [provider]   valsartan -hydrochlorothiazide  (DIOVAN -HCT) 160-25 MG tablet Take 1 tablet by mouth daily. 08/29/23   Ladona Heinz, MD    Physical Exam    Vital Signs:  Caleb Rogers does not have vital signs available for review today.  Given telephonic nature of communication, physical exam is limited. AAOx3. NAD. Normal affect.  Speech and respirations are unlabored.  Assessment & Plan    Primary Cardiologist: Heinz Ladona, MD  Preoperative cardiovascular risk assessment.  Urologic biopsy by Dr. Shane.  Chart reviewed as part of pre-operative protocol coverage. According to the RCRI, patient has a 0.9% risk of MACE. Patient reports activity equivalent to >4.0 METS (2 days of high intensity workouts and strength training).   Given past medical history and time since last visit, based on ACC/AHA guidelines, Caleb Rogers would be at acceptable risk for the planned procedure without further cardiovascular testing.   Patient  was advised that if he develops new symptoms prior to surgery to contact our office to arrange a follow-up appointment.  he verbalized understanding.  Ideally aspirin  should be continued without interruption, however if the bleeding risk is too great, aspirin  may be held for 5-7 days prior to surgery. Please resume aspirin  post operatively when it is felt to be safe from a bleeding standpoint.      I will route this recommendation to the requesting party via Epic fax function.  Please call with questions.  Time:   Today, I have spent 6 minutes with the patient with telehealth technology discussing medical history, symptoms, and management plan.     Barnie Hila, NP  03/27/2024, 9:23 AM

## 2024-04-08 ENCOUNTER — Encounter: Payer: Self-pay | Admitting: Orthopaedic Surgery

## 2024-04-09 ENCOUNTER — Other Ambulatory Visit: Payer: Self-pay | Admitting: Physician Assistant

## 2024-04-09 MED ORDER — DOXYCYCLINE HYCLATE 100 MG PO TABS: 100.0000 mg | ORAL_TABLET | Freq: Two times a day (BID) | ORAL | 0 refills | Status: AC

## 2024-04-09 MED ORDER — HYDROCODONE-ACETAMINOPHEN 5-325 MG PO TABS: 1.0000 | ORAL_TABLET | Freq: Four times a day (QID) | ORAL | 0 refills | Status: AC | PRN

## 2024-04-09 NOTE — Telephone Encounter (Signed)
 sent

## 2024-04-10 DIAGNOSIS — N4289 Other specified disorders of prostate: Secondary | ICD-10-CM | POA: Diagnosis not present

## 2024-04-10 DIAGNOSIS — C61 Malignant neoplasm of prostate: Secondary | ICD-10-CM | POA: Diagnosis not present

## 2024-04-20 DIAGNOSIS — C61 Malignant neoplasm of prostate: Secondary | ICD-10-CM | POA: Diagnosis not present

## 2024-04-22 DIAGNOSIS — T8459XA Infection and inflammatory reaction due to other internal joint prosthesis, initial encounter: Secondary | ICD-10-CM | POA: Diagnosis not present

## 2024-04-22 NOTE — Progress Notes (Addendum)
 CHIEF COMPLAINT:  Right hip PJI   HISTORY OF PRESENT ILLNESS: Caleb Rogers is a 73 y.o. male who presents to clinic today for evaluation of right hip PJI.  He underwent a right total hip arthroplasty on 06/20/2022.  Per chart reviews he had some subjective fatigue and fevers a couple months into his postoperative urinary, however these went away.  He was otherwise asymptomatic until 08/12/2023.  At that point he developed a fluid-filled pocket at the distal aspect of his surgical incision.  He self decompressed this using a sewing needle.  At that time, oral antibiotics and peroxide based dressings were recommended.  The patient then developed a surgical wound dehiscence at the distal aspect of his incision.  He had an irrigation and debridement on 09/18/2023.  He had recurrence of his infection with abscess and underwent a second irrigation and debridement on 01/20/2024.  At that point, the patient was recommended to follow-up here to the [facility] prosthetic [facility] infection center for further evaluation and management.  At the time of his previous surgeries he had culture positive for MRSA.  He has a past medical history of GERD, hypertension, OSA, and TIA.  He saw his cardiologist less than 1 month ago for preoperative risk assessment at which point he was deemed optimized to undergo surgery without any further testing.  REVIEW OF SYSTEMS: Diabetes mellitus: No   Active tobacco use: No DVT/PE: No Chronic anticoagulation: No Immunosuppression: No Unexpected weight loss: No  PERTINENT PAST MEDICAL AND SURGICAL HISTORY Past Medical History:  Diagnosis Date  . Cancer (HHS/HCC) 12/619  . Heart disease 12/27/18  . Hypertension 12/27/18   Past Surgical History:  Procedure Laterality Date  . HIP SURGERY  01/27/24   MRSA PJI  . MR HEAD ANGIO WO IV CONTRAST  09/16/2013   MR HEAD ANGIO WO IV CONTRAST 09/16/2013    PHYSICAL EXAMINATION General: No Apparent Distress.  Psyc: Alert and  Oriented x 3. Coordination, orientation, mood and affect are normal The patient has a nonantalgic gait. Body mass index is 29.83 kg/m.  Musculoskeletal: Spine: Good motion of the spine  Hip: Examination of the right hip reveals a healed anterior incision. ROM of the hip is flexion to 110, ER to 30, IR to 0, abduction to 30.   No bony or soft tissue masses are palpable. Abductor muscle strength is 5/5. There is no palpable defect in the abductor mechanism. There is no hip instability.    Knee: Examination of the ipsilateral knee reveals no scars or incisions. There is no effusion. There is not medial or lateral joint line line tenderness. ROM is full without pain at the extremes of motion. Stable to varus and valgus stress. Negative anterior and posterior drawer.  There is normal patellar tracking without crepitus. Quad strength and hamstring strength is 5/5.  Neuro: Motor and Sensory exam is normal except described above Vascular: Examination of Bilateral lower extremities reveals warm extremities Dermal: skin is normal tone, color and texture except as described above Lymphatic: No lymphadenopathy Respiratory: No labored breathing Eyes: sclera are not icteric  RADIOGRAPHS:  XR IMAGING RESULTSXR pelvis 1 or 2 views      DOS: 04/22/2024 Sit/stand radiographs taken for preoperative planning  XR hip right 2 or 3 views      DOS: 04/22/2024 Radiographs of the right hip taken in clinic today and reviewed independently by myself reveal evidence of a well-fixed and well position total hip arthroplasty including a DePuy cup and Actis stem.  There is stress shielding of the calcar indicating good fixation of the triple taper stem.  There is a lytic lesion in zone 2 behind the acetabular component consistent with known diagnosis of prosthetic joint infection.   ASSESSMENT:  Infection and inflammatory reaction due to other internal joint prosthesis, initial encounter  PLAN:  I had a good discussion  with the patient today and we reviewed his course thus far together.  We discussed surgical treatment options in detail with the patient.  We discussed that he has evidence of a chronic MRSA prosthetic joint infection.  We discussed both single-stage as well as two-stage exchange. We discussed the two-stage exchange would involve removing all of the components, placing an antibiotic spacer, and having the patient on antibiotics for at least a 6-week IV course and 6-week oral course at which point we will proceed with definitive reimplantation.  We discussed that 1 stage would include a much more involved surgery which would be at least 4 hours and would also include a 6-week IV course and month oral course of antibiotics, however there would not be an intervening stage.  We did discuss her recent randomized control trial which included patients with MRSA.  We did discuss that at this point two-stage is still the  gold standard.  We discussed the risks and benefits of both including, but not limited to infection, bleeding, damage to nerves, muscles, tendons, ligaments, need for further surgery, fracture, leg length discrepancy, instability, heart attack, stroke, death.  The patient would like to proceed with single-stage exchange.  We discussed that if all of his components come out without issue that we will proceed with single-stage exchange.  We also discussed that given his well fixated triple taper stem his surgery may necessitate an extended trochanteric osteotomy and in this case we would likely proceed with a spacer option which would allow the ETO to heal in the intervening stage.  The patient will need preoperative clearance both from cardiology and the medicine team which she will obtain.  We discussed that we will go back through his anterior incision to perform this revision, we will use an incisional VAC and place him on prophylactic antibiotics.  Implants will be readapt cup and stem, possible  catalyst stem

## 2024-04-23 DIAGNOSIS — A4902 Methicillin resistant Staphylococcus aureus infection, unspecified site: Secondary | ICD-10-CM | POA: Diagnosis not present

## 2024-04-23 DIAGNOSIS — E785 Hyperlipidemia, unspecified: Secondary | ICD-10-CM | POA: Diagnosis not present

## 2024-04-23 DIAGNOSIS — I10 Essential (primary) hypertension: Secondary | ICD-10-CM | POA: Diagnosis not present

## 2024-04-23 DIAGNOSIS — I2510 Atherosclerotic heart disease of native coronary artery without angina pectoris: Secondary | ICD-10-CM | POA: Diagnosis not present

## 2024-04-23 DIAGNOSIS — Z01818 Encounter for other preprocedural examination: Secondary | ICD-10-CM | POA: Diagnosis not present

## 2024-04-23 DIAGNOSIS — T8450XA Infection and inflammatory reaction due to unspecified internal joint prosthesis, initial encounter: Secondary | ICD-10-CM | POA: Diagnosis not present

## 2024-04-23 DIAGNOSIS — G4733 Obstructive sleep apnea (adult) (pediatric): Secondary | ICD-10-CM | POA: Diagnosis not present

## 2024-04-28 ENCOUNTER — Encounter: Payer: Self-pay | Admitting: Orthopaedic Surgery

## 2024-04-30 DIAGNOSIS — Z96649 Presence of unspecified artificial hip joint: Secondary | ICD-10-CM | POA: Diagnosis not present

## 2024-04-30 DIAGNOSIS — T8459XA Infection and inflammatory reaction due to other internal joint prosthesis, initial encounter: Secondary | ICD-10-CM | POA: Diagnosis not present

## 2024-04-30 DIAGNOSIS — Z8614 Personal history of Methicillin resistant Staphylococcus aureus infection: Secondary | ICD-10-CM | POA: Diagnosis not present

## 2024-04-30 DIAGNOSIS — Z96641 Presence of right artificial hip joint: Secondary | ICD-10-CM | POA: Diagnosis not present

## 2024-04-30 DIAGNOSIS — K219 Gastro-esophageal reflux disease without esophagitis: Secondary | ICD-10-CM | POA: Diagnosis not present

## 2024-04-30 DIAGNOSIS — Z7982 Long term (current) use of aspirin: Secondary | ICD-10-CM | POA: Diagnosis not present

## 2024-04-30 DIAGNOSIS — Z471 Aftercare following joint replacement surgery: Secondary | ICD-10-CM | POA: Diagnosis not present

## 2024-04-30 DIAGNOSIS — Z79899 Other long term (current) drug therapy: Secondary | ICD-10-CM | POA: Diagnosis not present

## 2024-04-30 DIAGNOSIS — Y831 Surgical operation with implant of artificial internal device as the cause of abnormal reaction of the patient, or of later complication, without mention of misadventure at the time of the procedure: Secondary | ICD-10-CM | POA: Diagnosis not present

## 2024-04-30 DIAGNOSIS — E785 Hyperlipidemia, unspecified: Secondary | ICD-10-CM | POA: Diagnosis not present

## 2024-04-30 DIAGNOSIS — R5383 Other fatigue: Secondary | ICD-10-CM | POA: Diagnosis not present

## 2024-04-30 DIAGNOSIS — A4902 Methicillin resistant Staphylococcus aureus infection, unspecified site: Secondary | ICD-10-CM | POA: Diagnosis not present

## 2024-04-30 DIAGNOSIS — E7849 Other hyperlipidemia: Secondary | ICD-10-CM | POA: Diagnosis not present

## 2024-04-30 DIAGNOSIS — I10 Essential (primary) hypertension: Secondary | ICD-10-CM | POA: Diagnosis not present

## 2024-04-30 DIAGNOSIS — R609 Edema, unspecified: Secondary | ICD-10-CM | POA: Diagnosis not present

## 2024-04-30 DIAGNOSIS — G4733 Obstructive sleep apnea (adult) (pediatric): Secondary | ICD-10-CM | POA: Diagnosis not present

## 2024-04-30 DIAGNOSIS — T8450XA Infection and inflammatory reaction due to unspecified internal joint prosthesis, initial encounter: Secondary | ICD-10-CM | POA: Diagnosis not present

## 2024-04-30 DIAGNOSIS — Z9889 Other specified postprocedural states: Secondary | ICD-10-CM | POA: Diagnosis not present

## 2024-04-30 DIAGNOSIS — D62 Acute posthemorrhagic anemia: Secondary | ICD-10-CM | POA: Diagnosis not present

## 2024-04-30 DIAGNOSIS — Z452 Encounter for adjustment and management of vascular access device: Secondary | ICD-10-CM | POA: Diagnosis not present

## 2024-04-30 DIAGNOSIS — Z8673 Personal history of transient ischemic attack (TIA), and cerebral infarction without residual deficits: Secondary | ICD-10-CM | POA: Diagnosis not present

## 2024-04-30 DIAGNOSIS — T8451XA Infection and inflammatory reaction due to internal right hip prosthesis, initial encounter: Secondary | ICD-10-CM | POA: Diagnosis not present

## 2024-04-30 DIAGNOSIS — B9562 Methicillin resistant Staphylococcus aureus infection as the cause of diseases classified elsewhere: Secondary | ICD-10-CM | POA: Diagnosis not present

## 2024-04-30 DIAGNOSIS — M9701XD Periprosthetic fracture around internal prosthetic right hip joint, subsequent encounter: Secondary | ICD-10-CM | POA: Diagnosis not present

## 2024-04-30 DIAGNOSIS — Z87891 Personal history of nicotine dependence: Secondary | ICD-10-CM | POA: Diagnosis not present

## 2024-05-06 DIAGNOSIS — T8450XA Infection and inflammatory reaction due to unspecified internal joint prosthesis, initial encounter: Secondary | ICD-10-CM | POA: Diagnosis not present

## 2024-05-06 NOTE — Discharge Summary (Signed)
 CC: R hip PJI-MRSA pain Date of Admission: 04/30/2024 Date of Discharge:   HPI:  Patient is a pleasant 73 y.o. male admitted due to R hip periprosthetic joint infection hip refractory to prolonged treatment. he opted to proceed with reivision right THA following extensive discussion of potential risks, benefits, and alternatives. Written informed consent was obtained. All questions were answered.   Hospital Course:  The patient was admitted to the orthopaedic surgery service , and taken to the operating room, where he underwent a Revision Right total hip arthroplasty single stage. For a complete description of the operation, please refer to the dedicated operative note. Patient was transferredpostoperatively in stable condition. Patient was placed on mechanical compression devices with foot pumps for DVT prophylaxis, along with an aspirin  regimen. While on the floor, patient's pain was easily controlled with oral pain meds. Patient's diet was easily advanced. Patient remained medically stable throughout hospitalization, ID consulted and followed. Patient was discharged home in stable condition following clearance by PT/OT.   Discharge Plan:  Advanced to wbat with assistive devices Prevana dressing in place until f/u in office.  Chewable Aspirin  81 mg po bid x 30 days for DVT ppx. There will be no outpatient PT.   F/u in office for wound vac removal. Call [phone] if you do not have an appointment   Medication List     START taking these medications    aspirin  81 mg chewable tablet Chew 1 tablet (81 mg total) 2 (two) times a day. Replaces: aspirin  81 mg EC tablet   HYDROcodone -acetaminophen  10-325 mg per tablet Commonly known as: NORCO Take 1 tablet by mouth every 6 (six) hours as needed for severe pain (7-10).   meloxicam  7.5 mg tablet Commonly known as: MOBIC  Take 1 tablet (7.5 mg total) by mouth daily.   traMADoL 50 mg tablet Commonly known as: ULTRAM Take 1 tablet (50 mg  total) by mouth every 6 (six) hours as needed for moderate pain (4-6).       CONTINUE taking these medications    amLODIPine  5 mg tablet Commonly known as: NORVASC  Take 5 mg by mouth daily.   esomeprazole 40 mg DR capsule Commonly known as: NexIUM Take 20 mg by mouth every morning before breakfast.   ezetimibe  10 mg tablet Commonly known as: ZETIA  Take 10 mg by mouth daily.   Repatha SureClick 140 mg/mL Pnij Generic drug: evolocumab Inject 140 mg under the skin every 14 (fourteen) days.   valsartan -hydroCHLOROthiazide  160-25 mg per tablet Commonly known as: DIOVAN  HCT Take 1 tablet by mouth daily.       STOP taking these medications    aspirin  81 mg EC tablet Replaced by: aspirin  81 mg chewable tablet   doxycycline  100 mg tablet Commonly known as: VIBRA -TABS         Where to Get Your Medications     These medications were sent to [id number] - [place], [place] - [address] - PHONE: [phone] - FAX: [phone]  [address], [place] [address]    Phone: [phone]  aspirin  81 mg chewable tablet HYDROcodone -acetaminophen  10-325 mg per tablet meloxicam  7.5 mg tablet traMADoL 50 mg tablet

## 2024-05-07 ENCOUNTER — Other Ambulatory Visit (HOSPITAL_COMMUNITY): Payer: Self-pay | Admitting: Urology

## 2024-05-07 DIAGNOSIS — C61 Malignant neoplasm of prostate: Secondary | ICD-10-CM

## 2024-05-07 DIAGNOSIS — T8450XA Infection and inflammatory reaction due to unspecified internal joint prosthesis, initial encounter: Secondary | ICD-10-CM | POA: Diagnosis not present

## 2024-05-08 DIAGNOSIS — C61 Malignant neoplasm of prostate: Secondary | ICD-10-CM | POA: Diagnosis not present

## 2024-05-08 DIAGNOSIS — N4232 Atypical small acinar proliferation of prostate: Secondary | ICD-10-CM | POA: Diagnosis not present

## 2024-05-11 DIAGNOSIS — T8450XA Infection and inflammatory reaction due to unspecified internal joint prosthesis, initial encounter: Secondary | ICD-10-CM | POA: Diagnosis not present

## 2024-05-14 DIAGNOSIS — T8450XA Infection and inflammatory reaction due to unspecified internal joint prosthesis, initial encounter: Secondary | ICD-10-CM | POA: Diagnosis not present

## 2024-05-18 DIAGNOSIS — T8450XA Infection and inflammatory reaction due to unspecified internal joint prosthesis, initial encounter: Secondary | ICD-10-CM | POA: Diagnosis not present

## 2024-05-20 DIAGNOSIS — Z96641 Presence of right artificial hip joint: Secondary | ICD-10-CM | POA: Diagnosis not present

## 2024-05-20 DIAGNOSIS — T8450XA Infection and inflammatory reaction due to unspecified internal joint prosthesis, initial encounter: Secondary | ICD-10-CM | POA: Diagnosis not present

## 2024-05-20 DIAGNOSIS — Z471 Aftercare following joint replacement surgery: Secondary | ICD-10-CM | POA: Diagnosis not present

## 2024-05-21 DIAGNOSIS — T8450XA Infection and inflammatory reaction due to unspecified internal joint prosthesis, initial encounter: Secondary | ICD-10-CM | POA: Diagnosis not present

## 2024-05-22 DIAGNOSIS — R399 Unspecified symptoms and signs involving the genitourinary system: Secondary | ICD-10-CM | POA: Diagnosis not present

## 2024-05-22 DIAGNOSIS — C61 Malignant neoplasm of prostate: Secondary | ICD-10-CM | POA: Diagnosis not present

## 2024-05-25 ENCOUNTER — Encounter: Payer: Self-pay | Admitting: Radiology

## 2024-05-28 DIAGNOSIS — T8450XA Infection and inflammatory reaction due to unspecified internal joint prosthesis, initial encounter: Secondary | ICD-10-CM | POA: Diagnosis not present

## 2024-06-02 DIAGNOSIS — T8459XA Infection and inflammatory reaction due to other internal joint prosthesis, initial encounter: Secondary | ICD-10-CM | POA: Diagnosis not present

## 2024-06-04 DIAGNOSIS — T8459XA Infection and inflammatory reaction due to other internal joint prosthesis, initial encounter: Secondary | ICD-10-CM | POA: Diagnosis not present

## 2024-06-04 DIAGNOSIS — T8450XA Infection and inflammatory reaction due to unspecified internal joint prosthesis, initial encounter: Secondary | ICD-10-CM | POA: Diagnosis not present

## 2024-06-05 DIAGNOSIS — T8450XA Infection and inflammatory reaction due to unspecified internal joint prosthesis, initial encounter: Secondary | ICD-10-CM | POA: Diagnosis not present

## 2024-06-08 DIAGNOSIS — C61 Malignant neoplasm of prostate: Secondary | ICD-10-CM | POA: Diagnosis not present

## 2024-06-10 DIAGNOSIS — T8450XA Infection and inflammatory reaction due to unspecified internal joint prosthesis, initial encounter: Secondary | ICD-10-CM | POA: Diagnosis not present

## 2024-06-10 DIAGNOSIS — T8451XD Infection and inflammatory reaction due to internal right hip prosthesis, subsequent encounter: Secondary | ICD-10-CM | POA: Diagnosis not present

## 2024-06-10 DIAGNOSIS — Z792 Long term (current) use of antibiotics: Secondary | ICD-10-CM | POA: Diagnosis not present

## 2024-06-10 DIAGNOSIS — A4902 Methicillin resistant Staphylococcus aureus infection, unspecified site: Secondary | ICD-10-CM | POA: Diagnosis not present

## 2024-06-24 ENCOUNTER — Other Ambulatory Visit: Payer: Self-pay | Admitting: *Deleted

## 2024-06-24 ENCOUNTER — Inpatient Hospital Stay: Attending: Hematology and Oncology

## 2024-06-24 DIAGNOSIS — D472 Monoclonal gammopathy: Secondary | ICD-10-CM | POA: Diagnosis present

## 2024-06-24 LAB — CBC WITH DIFFERENTIAL (CANCER CENTER ONLY)
Abs Immature Granulocytes: 0.02 K/uL (ref 0.00–0.07)
Basophils Absolute: 0.1 K/uL (ref 0.0–0.1)
Basophils Relative: 1 %
Eosinophils Absolute: 0.2 K/uL (ref 0.0–0.5)
Eosinophils Relative: 5 %
HCT: 35 % — ABNORMAL LOW (ref 39.0–52.0)
Hemoglobin: 11.7 g/dL — ABNORMAL LOW (ref 13.0–17.0)
Immature Granulocytes: 0 %
Lymphocytes Relative: 30 %
Lymphs Abs: 1.5 K/uL (ref 0.7–4.0)
MCH: 29.8 pg (ref 26.0–34.0)
MCHC: 33.4 g/dL (ref 30.0–36.0)
MCV: 89.1 fL (ref 80.0–100.0)
Monocytes Absolute: 0.8 K/uL (ref 0.1–1.0)
Monocytes Relative: 16 %
Neutro Abs: 2.4 K/uL (ref 1.7–7.7)
Neutrophils Relative %: 48 %
Platelet Count: 305 K/uL (ref 150–400)
RBC: 3.93 MIL/uL — ABNORMAL LOW (ref 4.22–5.81)
RDW: 13.1 % (ref 11.5–15.5)
WBC Count: 4.9 K/uL (ref 4.0–10.5)
nRBC: 0 % (ref 0.0–0.2)

## 2024-06-24 LAB — CMP (CANCER CENTER ONLY)
ALT: 23 U/L (ref 0–44)
AST: 24 U/L (ref 15–41)
Albumin: 4.3 g/dL (ref 3.5–5.0)
Alkaline Phosphatase: 107 U/L (ref 38–126)
Anion gap: 9 (ref 5–15)
BUN: 32 mg/dL — ABNORMAL HIGH (ref 8–23)
CO2: 27 mmol/L (ref 22–32)
Calcium: 9.5 mg/dL (ref 8.9–10.3)
Chloride: 105 mmol/L (ref 98–111)
Creatinine: 1.05 mg/dL (ref 0.61–1.24)
GFR, Estimated: >60 mL/min (ref >60)
Glucose, Bld: 91 mg/dL (ref 70–99)
Potassium: 4.3 mmol/L (ref 3.5–5.1)
Sodium: 141 mmol/L (ref 135–145)
Total Bilirubin: 0.3 mg/dL (ref 0.0–1.2)
Total Protein: 7.1 g/dL (ref 6.5–8.1)

## 2024-06-24 LAB — LACTATE DEHYDROGENASE: LDH: 167 U/L (ref 105–235)

## 2024-06-25 ENCOUNTER — Inpatient Hospital Stay

## 2024-06-25 ENCOUNTER — Telehealth: Payer: Self-pay | Admitting: Hematology and Oncology

## 2024-06-25 LAB — KAPPA/LAMBDA LIGHT CHAINS
Kappa free light chain: 18.3 mg/L (ref 3.3–19.4)
Kappa, lambda light chain ratio: 0.12 — ABNORMAL LOW (ref 0.26–1.65)
Lambda free light chains: 157.3 mg/L — ABNORMAL HIGH (ref 5.7–26.3)

## 2024-06-26 LAB — MULTIPLE MYELOMA PANEL, SERUM
Albumin SerPl Elph-Mcnc: 3.8 g/dL (ref 2.9–4.4)
Albumin/Glob SerPl: 1.4 (ref 0.7–1.7)
Alpha 1: 0.3 g/dL (ref 0.0–0.4)
Alpha2 Glob SerPl Elph-Mcnc: 0.8 g/dL (ref 0.4–1.0)
B-Globulin SerPl Elph-Mcnc: 1.1 g/dL (ref 0.7–1.3)
Gamma Glob SerPl Elph-Mcnc: 0.8 g/dL (ref 0.4–1.8)
Globulin, Total: 2.9 g/dL (ref 2.2–3.9)
IgA: 243 mg/dL (ref 61–437)
IgG (Immunoglobin G), Serum: 929 mg/dL (ref 603–1613)
IgM (Immunoglobulin M), Srm: 40 mg/dL (ref 15–143)
Total Protein ELP: 6.7 g/dL (ref 6.0–8.5)

## 2024-07-02 ENCOUNTER — Inpatient Hospital Stay: Admitting: Hematology and Oncology

## 2024-07-04 DIAGNOSIS — T8450XA Infection and inflammatory reaction due to unspecified internal joint prosthesis, initial encounter: Secondary | ICD-10-CM | POA: Diagnosis not present

## 2024-07-07 NOTE — Progress Notes (Signed)
 No show

## 2024-07-08 ENCOUNTER — Inpatient Hospital Stay: Admitting: Hematology and Oncology

## 2024-07-08 DIAGNOSIS — D649 Anemia, unspecified: Secondary | ICD-10-CM

## 2024-07-08 DIAGNOSIS — E611 Iron deficiency: Secondary | ICD-10-CM

## 2024-07-08 DIAGNOSIS — D472 Monoclonal gammopathy: Secondary | ICD-10-CM

## 2024-07-16 ENCOUNTER — Other Ambulatory Visit: Payer: Self-pay | Admitting: Cardiology

## 2024-07-16 DIAGNOSIS — I2510 Atherosclerotic heart disease of native coronary artery without angina pectoris: Secondary | ICD-10-CM

## 2024-07-16 DIAGNOSIS — I10 Essential (primary) hypertension: Secondary | ICD-10-CM

## 2024-07-18 ENCOUNTER — Encounter: Payer: Self-pay | Admitting: Hematology and Oncology

## 2024-08-06 ENCOUNTER — Inpatient Hospital Stay: Attending: Hematology and Oncology

## 2024-08-06 ENCOUNTER — Other Ambulatory Visit: Payer: Self-pay | Admitting: Hematology and Oncology

## 2024-08-06 DIAGNOSIS — D472 Monoclonal gammopathy: Secondary | ICD-10-CM

## 2024-08-06 DIAGNOSIS — Z7982 Long term (current) use of aspirin: Secondary | ICD-10-CM | POA: Diagnosis not present

## 2024-08-06 DIAGNOSIS — Z79899 Other long term (current) drug therapy: Secondary | ICD-10-CM | POA: Diagnosis not present

## 2024-08-06 DIAGNOSIS — Z8673 Personal history of transient ischemic attack (TIA), and cerebral infarction without residual deficits: Secondary | ICD-10-CM | POA: Diagnosis not present

## 2024-08-06 DIAGNOSIS — K30 Functional dyspepsia: Secondary | ICD-10-CM | POA: Insufficient documentation

## 2024-08-06 DIAGNOSIS — I10 Essential (primary) hypertension: Secondary | ICD-10-CM | POA: Insufficient documentation

## 2024-08-06 DIAGNOSIS — Z85828 Personal history of other malignant neoplasm of skin: Secondary | ICD-10-CM | POA: Diagnosis not present

## 2024-08-06 DIAGNOSIS — E7800 Pure hypercholesterolemia, unspecified: Secondary | ICD-10-CM | POA: Insufficient documentation

## 2024-08-06 DIAGNOSIS — K219 Gastro-esophageal reflux disease without esophagitis: Secondary | ICD-10-CM | POA: Diagnosis not present

## 2024-08-06 DIAGNOSIS — Z803 Family history of malignant neoplasm of breast: Secondary | ICD-10-CM | POA: Insufficient documentation

## 2024-08-06 DIAGNOSIS — Z87891 Personal history of nicotine dependence: Secondary | ICD-10-CM | POA: Diagnosis not present

## 2024-08-06 DIAGNOSIS — R110 Nausea: Secondary | ICD-10-CM | POA: Insufficient documentation

## 2024-08-06 DIAGNOSIS — G4730 Sleep apnea, unspecified: Secondary | ICD-10-CM | POA: Diagnosis not present

## 2024-08-06 LAB — CBC WITH DIFFERENTIAL (CANCER CENTER ONLY)
Abs Immature Granulocytes: 0.02 K/uL (ref 0.00–0.07)
Basophils Absolute: 0 K/uL (ref 0.0–0.1)
Basophils Relative: 1 %
Eosinophils Absolute: 0.2 K/uL (ref 0.0–0.5)
Eosinophils Relative: 4 %
HCT: 39 % (ref 39.0–52.0)
Hemoglobin: 13.6 g/dL (ref 13.0–17.0)
Immature Granulocytes: 0 %
Lymphocytes Relative: 40 %
Lymphs Abs: 2.6 K/uL (ref 0.7–4.0)
MCH: 29.4 pg (ref 26.0–34.0)
MCHC: 34.9 g/dL (ref 30.0–36.0)
MCV: 84.2 fL (ref 80.0–100.0)
Monocytes Absolute: 0.7 K/uL (ref 0.1–1.0)
Monocytes Relative: 11 %
Neutro Abs: 2.9 K/uL (ref 1.7–7.7)
Neutrophils Relative %: 44 %
Platelet Count: 305 K/uL (ref 150–400)
RBC: 4.63 MIL/uL (ref 4.22–5.81)
RDW: 13.3 % (ref 11.5–15.5)
WBC Count: 6.5 K/uL (ref 4.0–10.5)
nRBC: 0 % (ref 0.0–0.2)

## 2024-08-06 LAB — CMP (CANCER CENTER ONLY)
ALT: 35 U/L (ref 0–44)
AST: 30 U/L (ref 15–41)
Albumin: 4.6 g/dL (ref 3.5–5.0)
Alkaline Phosphatase: 97 U/L (ref 38–126)
Anion gap: 13 (ref 5–15)
BUN: 22 mg/dL (ref 8–23)
CO2: 25 mmol/L (ref 22–32)
Calcium: 9.6 mg/dL (ref 8.9–10.3)
Chloride: 100 mmol/L (ref 98–111)
Creatinine: 1.14 mg/dL (ref 0.61–1.24)
GFR, Estimated: >60 mL/min
Glucose, Bld: 100 mg/dL — ABNORMAL HIGH (ref 70–99)
Potassium: 3.9 mmol/L (ref 3.5–5.1)
Sodium: 139 mmol/L (ref 135–145)
Total Bilirubin: 0.3 mg/dL (ref 0.0–1.2)
Total Protein: 7.6 g/dL (ref 6.5–8.1)

## 2024-08-06 LAB — LACTATE DEHYDROGENASE: LDH: 157 U/L (ref 105–235)

## 2024-08-07 LAB — KAPPA/LAMBDA LIGHT CHAINS
Kappa free light chain: 15.7 mg/L (ref 3.3–19.4)
Kappa, lambda light chain ratio: 0.12 — ABNORMAL LOW (ref 0.26–1.65)
Lambda free light chains: 130.9 mg/L — ABNORMAL HIGH (ref 5.7–26.3)

## 2024-08-10 LAB — MULTIPLE MYELOMA PANEL, SERUM
Albumin SerPl Elph-Mcnc: 3.8 g/dL (ref 2.9–4.4)
Albumin/Glob SerPl: 1.3 (ref 0.7–1.7)
Alpha 1: 0.3 g/dL (ref 0.0–0.4)
Alpha2 Glob SerPl Elph-Mcnc: 0.7 g/dL (ref 0.4–1.0)
B-Globulin SerPl Elph-Mcnc: 1.2 g/dL (ref 0.7–1.3)
Gamma Glob SerPl Elph-Mcnc: 0.9 g/dL (ref 0.4–1.8)
Globulin, Total: 3.1 g/dL (ref 2.2–3.9)
IgA: 259 mg/dL (ref 61–437)
IgG (Immunoglobin G), Serum: 935 mg/dL (ref 603–1613)
IgM (Immunoglobulin M), Srm: 51 mg/dL (ref 15–143)
Total Protein ELP: 6.9 g/dL (ref 6.0–8.5)

## 2024-08-13 ENCOUNTER — Encounter: Payer: Self-pay | Admitting: Neurology

## 2024-08-13 ENCOUNTER — Other Ambulatory Visit: Payer: Self-pay | Admitting: Hematology and Oncology

## 2024-08-13 ENCOUNTER — Inpatient Hospital Stay: Admitting: Hematology and Oncology

## 2024-08-13 VITALS — BP 123/73 | HR 54 | Temp 97.2°F | Resp 14 | Wt 200.3 lb

## 2024-08-13 DIAGNOSIS — D472 Monoclonal gammopathy: Secondary | ICD-10-CM

## 2024-08-13 DIAGNOSIS — D649 Anemia, unspecified: Secondary | ICD-10-CM | POA: Diagnosis not present

## 2024-08-13 NOTE — Progress Notes (Signed)
 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  11/07/2022: SPEP did not detect monoclonal protein but polyclonal increase. Urine protein electrophoresis did detect M-spike measuring 32.8% with IFE that showed bence jones proteins; lambda type. Iron saturation 13% (L), Ferritin 88, TIBC 282, Iron 38, vitamin B12 381.  11/28/2022: Establish care with [facility]. Kappa 29.3, Lambda 127.9, Ratio 0.23. UPEP showed Bence Jones Protein.  12/18/2022: bone marrow biopsy showed normocellular bone marrow (30%) with mildly increased plasma cells (9% by manual aspirate differential, and up to 10% by CD138  immunohistochemical analysis), and lambda predominance by kappa/lambda  in situ hybridization.   Interval History:  Takeo Harts 74 y.o. male with medical history significant for free lambda light chain MGUS who presents for a follow up visit. The patient's last visit was on 01/01/2024. In the interim since the last visit he has had no major changes in his health.    On exam today Mr. Kalisz reports he has been well overall in interim since her last visit 6 months ago.  He said no major changes in his health.  He reports that he is not currently undergoing any treatments for his prostate cancer but continues to follow with urology.  He reports he recently started a GLP-1 and is having some weight loss.  He is down to total of 7 pounds.  He reports that his energy levels are somewhat low and he has been taking iron pills.  He does have some nausea and stomach upset as result of his GLP-1.   Otherwise he is at his baseline level of health with no fevers, chills, sweats, runny nose, sore throat, cough.  Overall he feels steady and has no questions concerns or complaints today.  A full 10 point ROS is otherwise negative.   MEDICAL HISTORY:  Past Medical History:  Diagnosis Date   Gait instability 08/23/2013   GERD (gastroesophageal reflux disease)    Hypertension    Impaired fasting glucose    OSA (obstructive sleep apnea) 02/24/2014   Pure hypercholesterolemia    Skin cancer of face    not melanoma   Sleep apnea    TIA (transient ischemic attack)    2015    SURGICAL HISTORY: Past Surgical History:  Procedure Laterality Date   APPLICATION OF WOUND VAC  01/20/2024   Procedure: APPLICATION, WOUND VAC, RIGHT HIP;  Surgeon: Jerri Kay HERO, MD;  Location: [facility] OR;  Service: Orthopedics;;   COLONOSCOPY     DEBRIDEMENT AND CLOSURE WOUND Right 09/18/2023   Procedure: IRRIGATION AND DEBRIDEMEN RIGHT HIP AND CLOSURE WOUND;  Surgeon: Jerri Kay HERO, MD;  Location: [facility];  Service: Orthopedics;  Laterality: Right;   INCISION AND DRAINAGE HIP Right 01/20/2024   Procedure: IRRIGATION AND DEBRIDEMENT HIP;  Surgeon: Jerri Kay HERO, MD;  Location: [facility] OR;  Service: Orthopedics;  Laterality: Right;  RIGHT HIP I & D   none     TEE WITHOUT CARDIOVERSION N/A 11/11/2014   Procedure: TRANSESOPHAGEAL ECHOCARDIOGRAM (TEE);  Surgeon: Heinz Ladona, MD;  Location: [facility];  Service: Cardiovascular;  Laterality: N/A;  h/p in file drawer  TOTAL HIP ARTHROPLASTY Right 06/20/2022   Procedure: RIGHT TOTAL HIP ARTHROPLASTY ANTERIOR APPROACH;  Surgeon: Jerri Kay HERO, MD;  Location: [facility] OR;  Service: Orthopedics;  Laterality: Right;  3-C    SOCIAL HISTORY: Social History   Socioeconomic History   Marital status: Married    Spouse name: Therisa   Number of children: 2   Years of education: college   Highest education level: Not on file  Occupational History   Occupation: Occupational Psychologist: PRODUCTION ASSISTANT, [employer]  Tobacco Use   Smoking status: Former    Current packs/day: 0.00    Types: Cigarettes    Start date: 07/24/1963    Quit date: 07/23/1968    Years since quitting: 56.0   Smokeless tobacco: Never   Tobacco comments:    pt quit July 1997  Vaping Use   Vaping status: Never Used  Substance and Sexual Activity   Alcohol  use: Yes    Comment: 2-3days a week- 1 beer   Drug use: No   Sexual activity: Yes    Partners: Female  Other Topics Concern   Not on file  Social History Narrative   Patient is married [name]) and lives at home with his wife.   Patient has two adult children.   Patient is working full-time.   Patient has a college education.   Patient is right- handed.   Patient drinks two cups of coffee daily.   Social Drivers of Health   Tobacco Use: Medium Risk (08/03/2024)   Received from [facility]   Patient History    Smoking Tobacco Use: Former    Smokeless Tobacco Use: Never    Passive Exposure: Not on file  Financial Resource Strain: Not on file  Food Insecurity: Low Risk (04/30/2024)   Received from [facility]   Epic    Within the past 12 months, you worried that your food would run out before you got money to buy more: Never true    Within the past 12 months, the food you bought just didn't last and you didn't have money to get more. : Never true  Transportation Needs: No Transportation Needs (04/30/2024)   Received from [facility]    In the past 12 months, has lack of reliable transportation kept you from medical appointments, meetings, work or from getting things needed for daily living? : No  Physical Activity: Not on file  Stress: Not on file  Social Connections: Socially Integrated (01/20/2024)   Social Connection and Isolation Panel    Frequency of Communication with Friends and Family: More than three times a week    Frequency of Social Gatherings with Friends and Family: More than three times a  week    Attends Religious Services: More than 4 times per year    Active Member of [employer] or Organizations: No    Attends Engineer, Structural: More than 4 times per year    Marital Status: Married  Catering Manager Violence: Not At Risk (01/20/2024)   Epic    Fear of Current or Ex-Partner: No    Emotionally Abused: No    Physically Abused: No    Sexually Abused: No  Depression (PHQ2-9): Not on file  Alcohol  Screen: Not on file  Housing: Unknown (06/08/2024)   Received from [facility]   Epic    Unable to Pay for Housing in the Last Year: Not on file    Number of Times Moved in  the Last Year: Not on file    At any time in the past 12 months, were you homeless or living in a shelter (including now)?: No  Utilities: Low Risk (04/30/2024)   Received from [facility]   Utilities    In the past 12 months has the electric, gas, oil, or water company threatened to shut off services in your home? : No  Health Literacy: Not on file    FAMILY HISTORY: Family History  Problem Relation Age of Onset   Breast cancer Mother        after age 56   Congestive Heart Failure Mother    Coronary artery disease Father    Coronary artery disease Brother 72    ALLERGIES:  is allergic to atorvastatin  and other.  MEDICATIONS:  Current Outpatient Medications  Medication Sig Dispense Refill   amLODipine  (NORVASC ) 5 MG tablet Take 1 tablet (5 mg total) by mouth daily. 90 tablet 1   aspirin  EC 81 MG tablet Take 81 mg by mouth daily. Swallow whole.     b complex vitamins capsule Take 1 capsule by mouth daily.     chlorhexidine  (HIBICLENS ) 4 % external liquid Apply 15 mLs topically as directed for 30 doses.  Use as directed daily for 5 days every other week for 6 weeks 946 mL 1   doxycycline  (VIBRA -TABS) 100 MG tablet Take 1 tablet (100 mg total) by mouth 2 (two) times daily. 60 tablet 0   ezetimibe  (ZETIA ) 10 MG tablet Take 1 tablet (10 mg total) by mouth daily. 30 tablet 0    ferrous sulfate  325 (65 FE) MG tablet Take 325 mg by mouth daily.     HYDROcodone -acetaminophen  (NORCO/VICODIN) 5-325 MG tablet Take 1 tablet by mouth every 6 (six) hours as needed for moderate pain (pain score 4-6). 40 tablet 0   methocarbamol  (ROBAXIN ) 500 MG tablet Take 1 tablet (500 mg total) by mouth 2 (two) times daily as needed. 20 tablet 2   Misc Natural Products (BEET ROOT PO) Take 1 tablet by mouth daily.     mupirocin  ointment (BACTROBAN ) 2 % Place one application into the nose twice daily for 60 doses.  Use as directed 2 times daily for 5 days every other week for 6 weeks. 22 g 0   omeprazole (PRILOSEC OTC) 20 MG tablet Take 20 mg by mouth daily.     ondansetron  (ZOFRAN ) 4 MG tablet Take 1 tablet (4 mg total) by mouth every 8 (eight) hours as needed for nausea or vomiting. 40 tablet 0   OVER THE COUNTER MEDICATION Take 1 capsule by mouth daily. NAD supplement     OVER THE COUNTER MEDICATION Take 1 tablet by mouth daily. Brain defender supplement     REPATHA SURECLICK 140 MG/ML SOAJ Inject 140 mg into the skin every 14 (fourteen) days.     valsartan -hydrochlorothiazide  (DIOVAN -HCT) 160-25 MG tablet Take 1 tablet by mouth daily. 15 tablet 0   No current facility-administered medications for this visit.    REVIEW OF SYSTEMS:   Constitutional: ( - ) fevers, ( - )  chills , ( - ) night sweats Eyes: ( - ) blurriness of vision, ( - ) double vision, ( - ) watery eyes Ears, nose, mouth, throat, and face: ( - ) mucositis, ( - ) sore throat Respiratory: ( - ) cough, ( - ) dyspnea, ( - ) wheezes Cardiovascular: ( - ) palpitation, ( - ) chest discomfort, ( - ) lower extremity swelling Gastrointestinal:  ( - )  nausea, ( - ) heartburn, ( - ) change in bowel habits Skin: ( - ) abnormal skin rashes Lymphatics: ( - ) new lymphadenopathy, ( - ) easy bruising Neurological: ( - ) numbness, ( - ) tingling, ( - ) new weaknesses Behavioral/Psych: ( - ) mood change, ( - ) new changes  All other systems  were reviewed with the patient and are negative.  PHYSICAL EXAMINATION: Vitals:   08/13/24 1352  BP: 123/73  Pulse: (!) 54  Resp: 14  Temp: (!) 97.2 F (36.2 C)  SpO2: 99%   Filed Weights   08/13/24 1352  Weight: 200 lb 4.8 oz (90.9 kg)    GENERAL: Well-appearing elderly Caucasian male, alert, no distress and comfortable SKIN: skin color, texture, turgor are normal, no rashes or significant lesions EYES: conjunctiva are pink and non-injected, sclera clear LUNGS: clear to auscultation and percussion with normal breathing effort HEART: regular rate & rhythm and no murmurs and no lower extremity edema Musculoskeletal: no cyanosis of digits and no clubbing  PSYCH: alert & oriented x 3, fluent speech NEURO: no focal motor/sensory deficits  LABORATORY DATA:  I have reviewed the data as listed    Latest Ref Rng & Units 08/06/2024   12:13 PM 06/24/2024   11:06 AM 01/20/2024   11:34 AM  CBC  WBC 4.0 - 10.5 K/uL 6.5  4.9  6.1   Hemoglobin 13.0 - 17.0 g/dL 86.3  88.2  87.0   Hematocrit 39.0 - 52.0 % 39.0  35.0  38.3   Platelets 150 - 400 K/uL 305  305  343        Latest Ref Rng & Units 08/06/2024   12:13 PM 06/24/2024   11:06 AM 01/21/2024    6:30 AM  CMP  Glucose 70 - 99 mg/dL 899  91  817   BUN 8 - 23 mg/dL 22  32  18   Creatinine 0.61 - 1.24 mg/dL 8.85  8.94  9.10   Sodium 135 - 145 mmol/L 139  141  134   Potassium 3.5 - 5.1 mmol/L 3.9  4.3  4.3   Chloride 98 - 111 mmol/L 100  105  99   CO2 22 - 32 mmol/L 25  27  25    Calcium  8.9 - 10.3 mg/dL 9.6  9.5  9.1   Total Protein 6.5 - 8.1 g/dL 7.6  7.1    Total Bilirubin 0.0 - 1.2 mg/dL 0.3  0.3    Alkaline Phos 38 - 126 U/L 97  107    AST 15 - 41 U/L 30  24    ALT 0 - 44 U/L 35  23      Lab Results  Component Value Date   MPROTEIN Not Observed 08/06/2024   MPROTEIN Not Observed 06/24/2024   MPROTEIN Not Observed 12/25/2023   Lab Results  Component Value Date   KPAFRELGTCHN 15.7 08/06/2024   KPAFRELGTCHN 18.3  06/24/2024   KPAFRELGTCHN 21.7 (H) 12/25/2023   LAMBDASER 130.9 (H) 08/06/2024   LAMBDASER 157.3 (H) 06/24/2024   LAMBDASER 145.9 (H) 12/25/2023   KAPLAMBRATIO 0.12 (L) 08/06/2024   KAPLAMBRATIO 0.12 (L) 06/24/2024   KAPLAMBRATIO 0.15 (L) 12/25/2023    RADIOGRAPHIC STUDIES: No results found.  ASSESSMENT & PLAN Bonifacio Pruden 74 y.o. male with medical history significant for free lambda light chain MGUS who presents for a follow up visit.  #Lambda light chain monoclonal gammopathy: --Outside UPEP from 11/07/2022 detected M-spike measuring 32.8% with IFE that showed bence jones proteins;  lambda type.SPEP did not detect M protein --Labs from 11/28/2022 were reviewed with the patient. SPEP/IFE did not detect a monoclonal protein. Serum free light chains showed elevated lambda light chain of 127.9, Kappa light chain of 29.3 and ratio of 0.23. CBC showed persistent anemia with Hgb 11.5, Plt 407K. CMP did not show renal dysfunction or hypercalcemia.  --24 hour UPEP from 12/03/2022 did detect monoclonal protein measuring 41 mg/24 hr. IFE showed bence jones proteins, lambda type --Bone met survey from 11/30/2022 did not show any lytic lesions or pathologic fractures -- bone marrow biopsy showed 9-10% plasma cells.  PLAN: --labs today show white blood cell 6.5, Hgb 13.6, MCV 84.2, Plt 305   --Serum free light chain levels stable (Kappa 130.9 with ratio 0.12).  M protein undetectable in the serum.  Will make sure we check UPEP at next visit. --recommend continued observation at this time.  --recommend RTC in 6 months time with repeat MGUS labs.  At next visit can extend visits to every 12 months.  Additionally recommend discussing a new bone met survey.  Orders Placed This Encounter  Procedures   24-Hr Ur UPEP/UIFE/Light Chains/TP    Standing Status:   Future    Expiration Date:   08/13/2025    All questions were answered. The patient knows to call the clinic with any problems, questions or  concerns.  A total of more than 25 minutes were spent on this encounter with face-to-face time and non-face-to-face time, including preparing to see the patient, ordering tests and/or medications, counseling the patient and coordination of care as outlined above.   Norleen IVAR Kidney, MD Department of Hematology/Oncology [facility] Phone: [phone] Pager: [phone] Email: [email or web address]  08/13/2024 2:27 PM  "

## 2024-09-24 ENCOUNTER — Ambulatory Visit: Payer: Medicare HMO | Admitting: Neurology

## 2025-02-09 ENCOUNTER — Inpatient Hospital Stay: Attending: Hematology and Oncology

## 2025-02-16 ENCOUNTER — Inpatient Hospital Stay: Admitting: Physician Assistant
